# Patient Record
Sex: Male | Born: 1955 | Race: White | Hispanic: No | Marital: Married | Smoking: Former smoker
Health system: Southern US, Community
[De-identification: ages and names within clinical notes are randomized; demographics above are authoritative.]

## PROBLEM LIST (undated history)

## (undated) DIAGNOSIS — E785 Hyperlipidemia, unspecified: Secondary | ICD-10-CM

## (undated) DIAGNOSIS — I712 Thoracic aortic aneurysm, without rupture, unspecified: Secondary | ICD-10-CM

## (undated) DIAGNOSIS — I10 Essential (primary) hypertension: Secondary | ICD-10-CM

## (undated) DIAGNOSIS — G4733 Obstructive sleep apnea (adult) (pediatric): Secondary | ICD-10-CM

## (undated) DIAGNOSIS — C801 Malignant (primary) neoplasm, unspecified: Secondary | ICD-10-CM

## (undated) HISTORY — PX: EYE SURGERY: SHX253

## (undated) HISTORY — PX: OTHER SURGICAL HISTORY: SHX169

---

## 2017-06-21 ENCOUNTER — Other Ambulatory Visit: Payer: Self-pay

## 2017-06-21 ENCOUNTER — Encounter (HOSPITAL_COMMUNITY)
Admission: EM | Disposition: A | Discharge status: Home / Self Care | Payer: Self-pay | Source: Home / Self Care | Attending: Emergency Medicine

## 2017-06-21 ENCOUNTER — Telehealth: Payer: Self-pay | Admitting: Gastroenterology

## 2017-06-21 ENCOUNTER — Emergency Department (HOSPITAL_COMMUNITY)
Admission: EM | Admit: 2017-06-21 | Discharge: 2017-06-21 | Disposition: A | Discharge status: Home / Self Care | Payer: BLUE CROSS/BLUE SHIELD | Attending: Emergency Medicine | Admitting: Emergency Medicine

## 2017-06-21 ENCOUNTER — Encounter (HOSPITAL_COMMUNITY): Payer: Self-pay | Admitting: Emergency Medicine

## 2017-06-21 DIAGNOSIS — K2950 Unspecified chronic gastritis without bleeding: Secondary | ICD-10-CM | POA: Diagnosis not present

## 2017-06-21 DIAGNOSIS — K222 Esophageal obstruction: Secondary | ICD-10-CM

## 2017-06-21 DIAGNOSIS — X58XXXA Exposure to other specified factors, initial encounter: Secondary | ICD-10-CM | POA: Diagnosis not present

## 2017-06-21 DIAGNOSIS — C160 Malignant neoplasm of cardia: Secondary | ICD-10-CM | POA: Insufficient documentation

## 2017-06-21 DIAGNOSIS — R1319 Other dysphagia: Secondary | ICD-10-CM

## 2017-06-21 DIAGNOSIS — K209 Esophagitis, unspecified: Secondary | ICD-10-CM | POA: Diagnosis not present

## 2017-06-21 DIAGNOSIS — T18128A Food in esophagus causing other injury, initial encounter: Secondary | ICD-10-CM

## 2017-06-21 DIAGNOSIS — K2980 Duodenitis without bleeding: Secondary | ICD-10-CM | POA: Diagnosis not present

## 2017-06-21 DIAGNOSIS — K2970 Gastritis, unspecified, without bleeding: Secondary | ICD-10-CM

## 2017-06-21 DIAGNOSIS — F17210 Nicotine dependence, cigarettes, uncomplicated: Secondary | ICD-10-CM | POA: Insufficient documentation

## 2017-06-21 DIAGNOSIS — K2990 Gastroduodenitis, unspecified, without bleeding: Secondary | ICD-10-CM | POA: Diagnosis not present

## 2017-06-21 DIAGNOSIS — R1310 Dysphagia, unspecified: Secondary | ICD-10-CM

## 2017-06-21 DIAGNOSIS — I10 Essential (primary) hypertension: Secondary | ICD-10-CM | POA: Insufficient documentation

## 2017-06-21 DIAGNOSIS — B9681 Helicobacter pylori [H. pylori] as the cause of diseases classified elsewhere: Secondary | ICD-10-CM | POA: Diagnosis not present

## 2017-06-21 DIAGNOSIS — W44F3XA Food entering into or through a natural orifice, initial encounter: Secondary | ICD-10-CM

## 2017-06-21 HISTORY — PX: ESOPHAGOGASTRODUODENOSCOPY: SHX5428

## 2017-06-21 HISTORY — DX: Essential (primary) hypertension: I10

## 2017-06-21 HISTORY — DX: Hyperlipidemia, unspecified: E78.5

## 2017-06-21 HISTORY — PX: ESOPHAGEAL DILATION: SHX303

## 2017-06-21 HISTORY — DX: Obstructive sleep apnea (adult) (pediatric): G47.33

## 2017-06-21 LAB — BASIC METABOLIC PANEL
Anion gap: 13 (ref 5–15)
BUN: 13 mg/dL (ref 6–20)
CALCIUM: 9.4 mg/dL (ref 8.9–10.3)
CO2: 24 mmol/L (ref 22–32)
Chloride: 104 mmol/L (ref 101–111)
Creatinine, Ser: 0.91 mg/dL (ref 0.61–1.24)
GFR calc Af Amer: >60 mL/min (ref >60)
GLUCOSE: 96 mg/dL (ref 65–99)
Potassium: 3.9 mmol/L (ref 3.5–5.1)
Sodium: 141 mmol/L (ref 135–145)

## 2017-06-21 LAB — CBC WITH DIFFERENTIAL/PLATELET
Basophils Absolute: 0 10*3/uL (ref 0.0–0.1)
Basophils Relative: 0 %
Eosinophils Absolute: 0.3 10*3/uL (ref 0.0–0.7)
Eosinophils Relative: 3 %
HCT: 47.4 % (ref 39.0–52.0)
Hemoglobin: 16.2 g/dL (ref 13.0–17.0)
LYMPHS ABS: 3.4 10*3/uL (ref 0.7–4.0)
LYMPHS PCT: 33 %
MCH: 30.8 pg (ref 26.0–34.0)
MCHC: 34.2 g/dL (ref 30.0–36.0)
MCV: 90.1 fL (ref 78.0–100.0)
MONO ABS: 0.7 10*3/uL (ref 0.1–1.0)
Monocytes Relative: 7 %
Neutro Abs: 6 10*3/uL (ref 1.7–7.7)
Neutrophils Relative %: 57 %
PLATELETS: 187 10*3/uL (ref 150–400)
RBC: 5.26 MIL/uL (ref 4.22–5.81)
RDW: 13.1 % (ref 11.5–15.5)
WBC: 10.4 10*3/uL (ref 4.0–10.5)

## 2017-06-21 SURGERY — EGD (ESOPHAGOGASTRODUODENOSCOPY)
Anesthesia: Moderate Sedation

## 2017-06-21 MED ORDER — MINERAL OIL PO OIL
TOPICAL_OIL | ORAL | Status: AC
  Filled 2017-06-21: qty 30

## 2017-06-21 MED ORDER — OMEPRAZOLE 20 MG PO CPDR: DELAYED_RELEASE_CAPSULE | ORAL | 3 refills | Status: DC

## 2017-06-21 MED ORDER — LIDOCAINE VISCOUS 2 % MT SOLN
OROMUCOSAL | Status: AC
  Filled 2017-06-21: qty 15

## 2017-06-21 MED ORDER — SODIUM CHLORIDE 0.9% FLUSH
INTRAVENOUS | Status: AC
  Filled 2017-06-21: qty 10

## 2017-06-21 MED ORDER — SIMETHICONE 40 MG/0.6ML PO SUSP
ORAL | Status: AC
  Filled 2017-06-21: qty 30

## 2017-06-21 MED ORDER — MIDAZOLAM HCL 5 MG/5ML IJ SOLN
INTRAMUSCULAR | Status: AC
  Filled 2017-06-21: qty 10

## 2017-06-21 MED ORDER — MEPERIDINE HCL 100 MG/ML IJ SOLN
INTRAMUSCULAR | Status: AC
  Filled 2017-06-21: qty 2

## 2017-06-21 MED ORDER — STERILE WATER FOR IRRIGATION IR SOLN
Status: DC | PRN
  Administered 2017-06-21: 100 mL

## 2017-06-21 MED ORDER — MEPERIDINE HCL 100 MG/ML IJ SOLN
INTRAMUSCULAR | Status: DC | PRN
  Administered 2017-06-21: 50 mg via INTRAVENOUS

## 2017-06-21 MED ORDER — GLUCAGON HCL RDNA (DIAGNOSTIC) 1 MG IJ SOLR
1.0000 mg | Freq: Once | INTRAMUSCULAR | Status: AC
  Administered 2017-06-21: 1 mg via INTRAVENOUS
  Filled 2017-06-21: qty 1

## 2017-06-21 MED ORDER — PROMETHAZINE HCL 25 MG/ML IJ SOLN
INTRAMUSCULAR | Status: DC | PRN
  Administered 2017-06-21: 12.5 mg via INTRAVENOUS

## 2017-06-21 MED ORDER — MIDAZOLAM HCL 5 MG/5ML IJ SOLN
INTRAMUSCULAR | Status: DC | PRN
  Administered 2017-06-21: 1 mg via INTRAVENOUS
  Administered 2017-06-21: 2 mg via INTRAVENOUS

## 2017-06-21 MED ORDER — PROMETHAZINE HCL 25 MG/ML IJ SOLN
INTRAMUSCULAR | Status: AC
  Filled 2017-06-21: qty 1

## 2017-06-21 MED ORDER — SODIUM CHLORIDE 0.9 % IV SOLN: INTRAVENOUS | Status: DC

## 2017-06-21 NOTE — Consult Note (Signed)
Referring Provider: No ref. provider found Primary Care Physician:  [facility] Primary Gastroenterologist:  Barney Drain  Reason for Consultation: FOO DIMPACTION   Impression: FOOD IMPACTION  Plan: 1. EGD/POSSIBLE DILATION TODAY. DISCUSSED PROCEDURE, BENEFITS, & RISKS: < 1% chance of medication reaction, bleeding, ASPIRATION, OR perforation [address].      HPI:  62 YO MALE WITH FOOD STUCK IN HIS ESOPHAGUS SINCE YESTERDAY. PRESENTED TO ED @1836  BECAUSE HE CAN'T SWALLOW ANYTHING. RARE HEARTBURN. HAD SOLID FOOD DYSPHAGIA IN PAST. Chest hurts when you feel food moving.   PT DENIES FEVER, CHILLS, HEMATOCHEZIA, HEMATEMESIS, nausea, vomiting, melena, diarrhea, weight loss, SHORTNESS OF BREATH,  CHANGE IN BOWEL IN HABITS, constipation, abdominal pain, or problems with sedation,.   Past Medical History:  Diagnosis Date  . Hyperlipidemia   . Hypertension     Past Surgical History:  Procedure Laterality Date  . colonoscopy     [place] 81 polyps removed, age 70: ? POLYPS, AGE 53: ? POLYPS  . EYE SURGERY Bilateral    cataract    No Known Allergies  Current Facility-Administered Medications  Medication Dose Route Frequency Provider Last Rate Last Dose  . none        .         .         .         .         .                Family History  Problem Relation Age of Onset  . Colon cancer Neg Hx   . Colon polyps Neg Hx    Social History   Socioeconomic History  . Marital status: Married    Spouse name: Not on file  . Number of children: 2 children: one passed age 75 ~2-3 weeks ago.  . Years of education: Not on file  . Highest education level: Not on file  Occupational History  . Not on file  Social Needs  . Financial resource strain: Not on file  . Food insecurity:    Worry: Not on file    Inability: Not on file  . Transportation needs:    Medical: Not on file    Non-medical: Not on file  Tobacco Use   . Smoking status: Current Every Day Smoker    Packs/day: 1.00    Types: Cigarettes  . Smokeless tobacco: Never Used  Substance and Sexual Activity  . Alcohol use: Yes    Comment: occ:1-2X/WEEK-BEER  . Drug use: Never  . Sexual activity: Not on file  Lifestyle  . Physical activity:    Days per week: Not on file    Minutes per session: Not on file  . Stress: Not on file  Relationships  . Social connections:    Talks on phone: Not on file    Gets together: Not on file    Attends religious service: Not on file    Active member of club or organization: Not on file    Attends meetings of clubs or organizations: Not on file    Relationship status: Not on file  Other Topics Concern  . Not on file  Social History Narrative  . Clinical biochemist by trade   Review of Systems: PER HPI OTHERWISE ALL SYSTEMS ARE NEGATIVE.  Vitals: Pulse 90, temperature 98.2 F (36.8 C), temperature source Oral, resp. rate 16, height 5\' 10"  (1.778 m), weight 205 lb (93 kg), SpO2 95 %.  Physical Exam: General:   Alert,  Well-developed, well-nourished, pleasant and cooperative in NAD Head:  Normocephalic and atraumatic. Eyes:  Sclera clear, no icterus.   Conjunctiva pink. Mouth:  No lesions, dentition normal. Neck:  Supple; no masses. Lungs:  Clear throughout to auscultation.   No wheezes. No acute distress. Heart:  Regular rate and rhythm; no murmurs. Abdomen:  Soft, nontender and nondistended. No masses noted. Normal bowel sounds, without guarding, and without rebound.   Msk:  Symmetrical without gross deformities. Normal posture. Extremities:  Without edema. Neurologic:  Alert and  oriented x4;  grossly normal neurologically. Cervical Nodes:  No significant cervical adenopathy. Psych:  Alert and cooperative. Normal mood and affect.   Lab Results: Recent Labs    06/21/17 2100  WBC 10.4  HGB 16.2  HCT 47.4  PLT 187   BMET No results for input(s): NA, K, CL, CO2, GLUCOSE, BUN, CREATININE,  CALCIUM in the last 72 hours. LFT No results for input(s): PROT, ALBUMIN, AST, ALT, ALKPHOS, BILITOT, BILIDIR, IBILI in the last 72 hours.   Studies/Results: NONE  LOS: 0 days   [employer]  06/21/2017, 9:21 PM

## 2017-06-21 NOTE — Discharge Instructions (Signed)
I REMOVED THE STEAK. 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  You have gastritis. I biopsied your stomach.   DRINK WATER TO KEEP YOUR URINE LIGHT YELLOW.  FOLLOW A SOFT MECHANICAL DIET.  MEATS SHOULD BE GROUND ONLY. DO NOT EAT CHUNKS OF ANYTHING. SEE INFO BELOW.  START OMEPRAZOLE.  TAKE 30 MINUTES PRIOR TO YOUR MEALS ONCE DAILY.  USE PEPCID OR ZANTAC IF NEEDED FOR HEARTBURN. USED ALONE IT IS NOT ADEQUATE TO PREVENT ACID REFLUX.    YOUR BIOPSY WILL BE BACK IN 7 DAYS.  REPEAT EGD TO COMPLETE THE ESOPHAGEAL DILATION IN 2-3 WEEKS.  FOLLOW UP APPT IN 4 MONTHS.    UPPER ENDOSCOPY AFTER CARE Read the instructions outlined below and refer to this sheet in the next week. These discharge instructions provide you with general information on caring for yourself after you leave the hospital. While your treatment has been planned according to the most current medical practices available, unavoidable complications occasionally occur. If you have any problems or questions after discharge, call DR. Zayonna Ayuso, [phone].  ACTIVITY  You may resume your regular activity, but move at a slower pace for the next 24 hours.   Take frequent rest periods for the next 24 hours.   Walking will help get rid of the air and reduce the bloated feeling in your belly (abdomen).   No driving for 24 hours (because of the medicine (anesthesia) used during the test).   You may shower.   Do not sign any important legal documents or operate any machinery for 24 hours (because of the anesthesia used during the test).    NUTRITION  Drink plenty of fluids.   You may resume your normal diet as instructed by your doctor.   Begin with a light meal and progress to your normal diet. Heavy or fried foods are harder to digest and may make you feel sick to your stomach (nauseated).   Avoid alcoholic beverages for 24 hours or as instructed.    MEDICATIONS  You may resume your normal  medications.   WHAT YOU CAN EXPECT TODAY  Some feelings of bloating in the abdomen.   Passage of more gas than usual.    IF YOU HAD A BIOPSY TAKEN DURING THE UPPER ENDOSCOPY:  Eat a soft diet IF YOU HAVE NAUSEA, BLOATING, ABDOMINAL PAIN, OR VOMITING.    FINDING OUT THE RESULTS OF YOUR TEST Not all test results are available during your visit. DR. Oneida Alar WILL CALL YOU WITHIN 14 DAYS OF YOUR PROCEDUE WITH YOUR RESULTS. Do not assume everything is normal if you have not heard from DR. Charlita Brian IN 14 DAYS, CALL HER OFFICE AT [phone].  SEEK IMMEDIATE MEDICAL ATTENTION AND CALL THE OFFICE: [phone] IF:  You have more than a spotting of blood in your stool.   Your belly is swollen (abdominal distention).   You are nauseated or vomiting.   You have a temperature over 101F.   You have abdominal pain or discomfort that is severe or gets worse throughout the day.     Gastritis  Gastritis is an inflammation (the body's way of reacting to injury and/or infection) of the stomach. It is often caused by bacterial (germ) infections. It can also be caused BY ASPIRIN, BC/GOODY POWDER'S, (IBUPROFEN) MOTRIN, OR ALEVE (NAPROXEN), chemicals (including alcohol), SPICY FOODS, and medications. This illness may be associated with generalized malaise (feeling tired, not well), UPPER ABDOMINAL STOMACH cramps, and fever. One common bacterial cause of gastritis is an organism known  as H. Pylori. This can be treated with antibiotics.    SOFT MECHANICAL DIET This SOFT MECHANICAL DIET is restricted to:  Foods that are moist, soft-textured, and easy to chew and swallow.   Meats that are ground or are minced no larger than one-quarter inch pieces. Meats are moist with gravy or sauce added.   Foods that do not include bread or bread-like textures except soft pancakes, well-moistened with syrup or sauce.   Textures with some chewing ability required.   Casseroles without rice.   Cooked vegetables  that are less than half an inch in size and easily mashed with a fork. No cooked corn, peas, broccoli, cauliflower, cabbage, Brussels sprouts, asparagus, or other fibrous, non-tender or rubbery cooked vegetables.   Canned fruit except for pineapple. Fruit must be cut into pieces no larger than half an inch in size.   Foods that do not include nuts, seeds, coconut, or sticky textures.   FOOD TEXTURES FOR DYSPHAGIA DIET LEVEL 2 -SOFT MECHANICAL DIET (includes all foods on Dysphagia Diet Level 1 - Pureed, in addition to the foods listed below)  FOOD GROUP: Breads. RECOMMENDED: Soft pancakes, well-moistened with syrup or sauce.  AVOID: All others.  FOOD GROUP: Cereals.  RECOMMENDED: Cooked cereals with little texture, including oatmeal. Unprocessed wheat bran stirred into cereals for bulk. Note: If thin liquids are restricted, it is important that all of the liquid is absorbed into the cereal.  AVOID: All dry cereals and any cooked cereals that may contain flax seeds or other seeds or nuts. Whole-grain, dry, or coarse cereals. Cereals with nuts, seeds, dried fruit, and/or coconut.  FOOD GROUP: Desserts. RECOMMENDED: Pudding, custard. Soft fruit pies with bottom crust only. Canned fruit (excluding pineapple). Soft, moist cakes with icing.Frozen malts, milk shakes, frozen yogurt, eggnog, nutritional supplements, ice cream, sherbet, regular or sugar-free gelatin, or any foods that become thin liquid at either room (70 F) or body temperature (98 F).  AVOID: Dry, coarse cakes and cookies. Anything with nuts, seeds, coconut, pineapple, or dried fruit. Breakfast yogurt with nuts. Rice or bread pudding.  FOOD GROUP: Fats. RECOMMENDED: Butter, margarine, cream for cereal (depending on liquid consistency recommendations), gravy, cream sauces, sour cream, sour cream dips with soft additives, mayonnaise, salad dressings, cream cheese, cream cheese spreads with soft additives, whipped toppings.  AVOID:  All fats with coarse or chunky additives.  FOOD GROUP: Fruits. RECOMMENDED: Soft drained, canned, or cooked fruits without seeds or skin. Fresh soft and ripe banana. Fruit juices with a small amount of pulp. If thin liquids are restricted, fruit juices should be thickened to appropriate consistency.  AVOID: Fresh or frozen fruits. Cooked fruit with skin or seeds. Dried fruits. Fresh, canned, or cooked pineapple.  FOOD GROUP: Meats and Meat Substitutes. (Meat pieces should not exceed 1/4 of an inch cube and should be tender.) RECOMMENDED: Moistened ground or cooked meat, poultry, or fish. Moist ground or tender meat may be served with gravy or sauce. Casseroles without rice. Moist macaroni and cheese, well-cooked pasta with meat sauce, tuna noodle casserole, soft, moist lasagna. Moist meatballs, meatloaf, or fish loaf. Protein salads, such as tuna or egg without large chunks, celery, or onion. Cottage cheese, smooth quiche without large chunks. Poached, scrambled, or soft-cooked eggs (egg yolks should not be runny but should be moist and able to be mashed with butter, margarine, or other moisture added to them). (Cook eggs to 160 F or use pasteurized eggs for safety.) Souffls may have small, soft chunks. Tofu. Well-cooked,  slightly mashed, moist legumes, such as baked beans. All meats or protein substitutes should be served with sauces or moistened to help maintain cohesiveness in the oral cavity.  AVOID: Dry meats, tough meats (such as bacon, sausage, hot dogs, bratwurst). Dry casseroles or casseroles with rice or large chunks. Peanut butter. Cheese slices and cubes. Hard-cooked or crisp fried eggs. Sandwiches.Pizza.  FOOD GROUP: Potatoes and Starches. RECOMMENDED: Well-cooked, moistened, boiled, baked, or mashed potatoes. Well-cooked shredded hash brown potatoes that are not crisp. (All potatoes need to be moist and in sauces.)Well-cooked noodles in sauce. Spaetzel or soft dumplings that have  been moistened with butter or gravy.  AVOID: Potato skins and chips. Fried or French-fried potatoes. Rice.  FOOD GROUP: Soups. RECOMMENDED: Soups with easy-to-chew or easy-to-swallow meats or vegetables: Particle sizes in soups should be less than 1/2 inch. Soups will need to be thickened to appropriate consistency if soup is thinner than prescribed liquid consistency.  AVOID: Soups with large chunks of meat and vegetables. Soups with rice, corn, peas.  FOOD GROUP: Vegetables. RECOMMENDED: All soft, well-cooked vegetables. Vegetables should be less than a half inch. Should be easily mashed with a fork.  AVOID: Cooked corn and peas. Broccoli, cabbage, Brussels sprouts, asparagus, or other fibrous, non-tender or rubbery cooked vegetables.  FOOD GROUP: Miscellaneous. RECOMMENDED: Jams and preserves without seeds, jelly. Sauces, salsas, etc., that may have small tender chunks less than 1/2 inch. Soft, smooth chocolate bars that are easily chewed.  AVOID: Seeds, nuts, coconut, or sticky foods. Chewy candies such as caramels or licorice.

## 2017-06-21 NOTE — Telephone Encounter (Signed)
REPEAT EGD TO COMPLETE THE ESOPHAGEAL DILATION IN 2-3 WEEKS, Dx: DYSPHAGIA.  FOLLOW UP APPT IN 4 MONTHS E30 DYSPHAGIA/PERSONAL HISTORY OF POLYPS.

## 2017-06-21 NOTE — H&P (View-Only) (Signed)
Referring Provider: No ref. provider found Primary Care Physician:  [facility] Primary Gastroenterologist:  Barney Drain  Reason for Consultation: FOO DIMPACTION   Impression: FOOD IMPACTION  Plan: 1. EGD/POSSIBLE DILATION TODAY. DISCUSSED PROCEDURE, BENEFITS, & RISKS: < 1% chance of medication reaction, bleeding, ASPIRATION, OR perforation [place].      HPI:  62 YO MALE WITH FOOD STUCK IN HIS ESOPHAGUS SINCE YESTERDAY. PRESENTED TO ED @1836  BECAUSE HE CAN'T SWALLOW ANYTHING. RARE HEARTBURN. HAD SOLID FOOD DYSPHAGIA IN PAST. Chest hurts when you feel food moving.   PT DENIES FEVER, CHILLS, HEMATOCHEZIA, HEMATEMESIS, nausea, vomiting, melena, diarrhea, weight loss, SHORTNESS OF BREATH,  CHANGE IN BOWEL IN HABITS, constipation, abdominal pain, or problems with sedation,.   Past Medical History:  Diagnosis Date  . Hyperlipidemia   . Hypertension     Past Surgical History:  Procedure Laterality Date  . colonoscopy     [place] 89 polyps removed, age 56: ? POLYPS, AGE 48: ? POLYPS  . EYE SURGERY Bilateral    cataract    No Known Allergies  Current Facility-Administered Medications  Medication Dose Route Frequency Provider Last Rate Last Dose  . none        .         .         .         .         .                Family History  Problem Relation Age of Onset  . Colon cancer Neg Hx   . Colon polyps Neg Hx    Social History   Socioeconomic History  . Marital status: Married    Spouse name: Not on file  . Number of children: 2 children: one passed age 32 ~2-3 weeks ago.  . Years of education: Not on file  . Highest education level: Not on file  Occupational History  . Not on file  Social Needs  . Financial resource strain: Not on file  . Food insecurity:    Worry: Not on file    Inability: Not on file  . Transportation needs:    Medical: Not on file    Non-medical: Not on file  Tobacco Use   . Smoking status: Current Every Day Smoker    Packs/day: 1.00    Types: Cigarettes  . Smokeless tobacco: Never Used  Substance and Sexual Activity  . Alcohol use: Yes    Comment: occ:1-2X/WEEK-BEER  . Drug use: Never  . Sexual activity: Not on file  Lifestyle  . Physical activity:    Days per week: Not on file    Minutes per session: Not on file  . Stress: Not on file  Relationships  . Social connections:    Talks on phone: Not on file    Gets together: Not on file    Attends religious service: Not on file    Active member of club or organization: Not on file    Attends meetings of clubs or organizations: Not on file    Relationship status: Not on file  Other Topics Concern  . Not on file  Social History Narrative  . Clinical biochemist by trade   Review of Systems: PER HPI OTHERWISE ALL SYSTEMS ARE NEGATIVE.  Vitals: Pulse 90, temperature 98.2 F (36.8 C), temperature source Oral, resp. rate 16, height 5\' 10"  (1.778 m), weight 205 lb (93 kg), SpO2 95 %.  Physical Exam: General:   Alert,  Well-developed, well-nourished, pleasant and cooperative in NAD Head:  Normocephalic and atraumatic. Eyes:  Sclera clear, no icterus.   Conjunctiva pink. Mouth:  No lesions, dentition normal. Neck:  Supple; no masses. Lungs:  Clear throughout to auscultation.   No wheezes. No acute distress. Heart:  Regular rate and rhythm; no murmurs. Abdomen:  Soft, nontender and nondistended. No masses noted. Normal bowel sounds, without guarding, and without rebound.   Msk:  Symmetrical without gross deformities. Normal posture. Extremities:  Without edema. Neurologic:  Alert and  oriented x4;  grossly normal neurologically. Cervical Nodes:  No significant cervical adenopathy. Psych:  Alert and cooperative. Normal mood and affect.   Lab Results: Recent Labs    06/21/17 2100  WBC 10.4  HGB 16.2  HCT 47.4  PLT 187   BMET No results for input(s): NA, K, CL, CO2, GLUCOSE, BUN, CREATININE,  CALCIUM in the last 72 hours. LFT No results for input(s): PROT, ALBUMIN, AST, ALT, ALKPHOS, BILITOT, BILIDIR, IBILI in the last 72 hours.   Studies/Results: NONE  LOS: 0 days   [employer]  06/21/2017, 9:21 PM

## 2017-06-21 NOTE — ED Provider Notes (Signed)
[facility] EMERGENCY DEPARTMENT Provider Note   CSN: [id number] Arrival date & time: 06/21/17  1836     History   Chief Complaint Chief Complaint  Patient presents with  . Swallowed Foreign Body    HPI Phillip Lane is a 62 y.o. male with no significant past medical history including no history of acid reflux disease presenting with a 1 day history of lower esophageal pressure sensation (points to xyphoid)  since swallowing a piece of steak which has not passed completely after choking yesterday while eating.  He was able to cough up a small portion of the food bolus, but has been unable to get anything including liquids to pass into his stomach.  He has had several episodes of also vomiting saliva.  He denies sob, fevers, diaphoresis, abdominal pain.  The history is provided by the patient and the spouse.    Past Medical History:  Diagnosis Date  . Hyperlipidemia   . Hypertension     Patient Active Problem List   Diagnosis Date Noted  . Food impaction of esophagus 06/21/2017    Past Surgical History:  Procedure Laterality Date  . colonoscopy     [place] 56 poly[place], age 61: ? POLYPS, AGE 67: ? POLYPS  . EYE SURGERY Bilateral    cataract        Home Medications    Prior to Admission medications   Not on File    Family History Family History  Problem Relation Age of Onset  . Colon cancer Neg Hx   . Colon polyps Neg Hx     Social History Social History   Tobacco Use  . Smoking status: Current Every Day Smoker    Packs/day: 1.00    Types: Cigarettes  . Smokeless tobacco: Never Used  Substance Use Topics  . Alcohol use: Yes    Comment: occ:1-2X/WEEK-BEER  . Drug use: Never     Allergies   Patient has no known allergies.   Review of Systems Review of Systems  Constitutional: Negative for chills and fever.  HENT: Negative for congestion and sore throat.   Eyes: Negative.   Respiratory: Positive for choking. Negative for shortness  of breath, wheezing and stridor.   Cardiovascular: Negative for chest pain.  Gastrointestinal: Negative for abdominal pain, nausea and vomiting.       Negative except as mentioned in HPI.   Genitourinary: Negative.   Musculoskeletal: Negative for arthralgias, joint swelling and neck pain.  Skin: Negative.  Negative for rash and wound.  Neurological: Negative for dizziness, weakness, light-headedness, numbness and headaches.  Psychiatric/Behavioral: Negative.      Physical Exam Updated Vital Signs BP (!) 127/96   Pulse 62   Temp 97.7 F (36.5 C) (Oral)   Resp 16   Ht 5\' 10"  (1.778 m)   Wt 93 kg (205 lb)   SpO2 99%   BMI 29.41 kg/m   Physical Exam  Constitutional: He appears well-developed and well-nourished.  HENT:  Head: Normocephalic and atraumatic.  Eyes: Conjunctivae are normal.  Neck: Normal range of motion.  Cardiovascular: Normal rate, regular rhythm, normal heart sounds and intact distal pulses.  Pulmonary/Chest: Effort normal and breath sounds normal. No stridor. He has no wheezes. He exhibits no tenderness.  Abdominal: Soft. Bowel sounds are normal. He exhibits no distension. There is no tenderness. There is no guarding.  Musculoskeletal: Normal range of motion.  Neurological: He is alert.  Skin: Skin is warm and dry.  Psychiatric: He has a normal  mood and affect.  Nursing note and vitals reviewed.    ED Treatments / Results  Labs (all labs ordered are listed, but only abnormal results are displayed) Labs Reviewed  CBC WITH DIFFERENTIAL/PLATELET  BASIC METABOLIC PANEL    EKG None  Radiology No results found.  Procedures Procedures (including critical care time)  Medications Ordered in ED    Initial Impression / Assessment and Plan / ED Course  I have reviewed the triage vital signs and the nursing notes.  Pertinent labs & imaging results that were available during my care of the patient were reviewed by me and considered in my medical  decision making (see chart for details).     Pt given IV glucagon with no significant improvement in sx.  Discussed with Dr. Oneida Alar who will take pt to endoscopy this evening.  Discussed with pt who is aware and agrees with plan.  Final Clinical Impressions(s) / ED Diagnoses   Final diagnoses:  Esophageal obstruction due to food impaction    ED Discharge Orders    None       Landis Martins 06/21/17 2232    Noemi Chapel, MD 06/27/17 [phone]

## 2017-06-21 NOTE — Interval H&P Note (Signed)
History and Physical Interval Note:  06/21/2017 10:29 PM  Phillip Lane  has presented today for surgery, with the diagnosis of FOOD impaction  The various methods of treatment have been discussed with the patient and family. After consideration of risks, benefits and other options for treatment, the patient has consented to  Procedure(s): ESOPHAGOGASTRODUODENOSCOPY (EGD) (N/A) ESOPHAGEAL DILATION (N/A) as a surgical intervention .  The patient's history has been reviewed, patient examined, no change in status, stable for surgery.  I have reviewed the patient's chart and labs.  Questions were answered to the patient's satisfaction.     [employer]

## 2017-06-21 NOTE — ED Triage Notes (Signed)
Yesterday pt got chocked on steak, states he cough some up, has not been able to eat or drink since then, and feeling like it is stuck in lower esophagus.

## 2017-06-21 NOTE — ED Provider Notes (Signed)
Medical screening examination/treatment/procedure(s) were conducted as a shared visit with non-physician practitioner(s) and myself.  I personally evaluated the patient during the encounter.  Clinical Impression:   Final diagnoses:  Esophageal obstruction due to food impaction    The patient is a 62 year old male, he states that about 1 week ago he started having some difficulty swallowing intermittently but did not think anything of it as the food would eventually go down.  Yesterday he was eating steak and he noticed that it got stuck in his lower chest, after that he has been trying to eat and drink but nothing goes down and he immediately vomits.  He feels as though this is been present for approximately 24 hours.  On my exam the patient appears comfortable, he does not have any discomfort with palpation of his abdomen.  The patient likely has a food impaction and will need GI consultation.   Noemi Chapel, MD 06/27/17 [phone]

## 2017-06-21 NOTE — Op Note (Signed)
[facility] Patient Name: Phillip Lane Procedure Date: 06/21/2017 10:19 PM MRN: [id number] Date of Birth: 11-29-1955 Attending MD: Barney Drain MD, MD CSN: [id number] Age: 63 Admit Type: Outpatient Procedure:                Upper GI endoscopy WITH COLD FORCEPS                            BIOPSY/ESOPHGEAL DILATION Indications:              Dysphagia, Foreign body in the esophagus Providers:                Barney Drain MD, MD, Rosina Lowenstein, RN, Bonnetta Barry,                            Technician Referring MD:             Rory Percy, MD Medicines:                Promethazine 12.5 mg IV, Meperidine 50 mg IV,                            Midazolam 3 mg IV Complications:            No immediate complications. Estimated Blood Loss:     Estimated blood loss was minimal. Procedure:                Pre-Anesthesia Assessment:                           - Prior to the procedure, a History and Physical                            was performed, and patient medications and                            allergies were reviewed. The patient's tolerance of                            previous anesthesia was also reviewed. The risks                            and benefits of the procedure and the sedation                            options and risks were discussed with the patient.                            All questions were answered, and informed consent                            was obtained. Prior Anticoagulants: The patient has                            taken no previous anticoagulant or antiplatelet  agents. ASA Grade Assessment: II - A patient with                            mild systemic disease. After reviewing the risks                            and benefits, the patient was deemed in                            satisfactory condition to undergo the procedure.                            After obtaining informed consent, the endoscope was   passed under direct vision. Throughout the                            procedure, the patient's blood pressure, pulse, and                            oxygen saturations were monitored continuously. The                            EG-[id number] ([id number]) scope was introduced through the                            mouth, and advanced to the second part of duodenum.                            The upper GI endoscopy was accomplished without                            difficulty. The patient tolerated the procedure                            well. Scope In: 10:37:56 PM Scope Out: 10:57:46 PM Total Procedure Duration: 0 hours 19 minutes 50 seconds  Findings:      Food was found in the lower third of the esophagus. Removal of food was       accomplished VIA ROTH NET. MUCOSA IN DISTAL ESOPHAGUS EDEMATOUS AND WITH       SUPERFICIAL ULCERS.      One benign-appearing, intrinsic moderate (circumferential scarring or       stenosis; an endoscope may pass) stenosis was found. This stenosis       measured 1.2 cm (inner diameter). The stenosis was traversed. A       guidewire was placed and the scope was withdrawn. Dilation was performed       with a Savary dilator with mild resistance at 12 mm, 12.8 mm, 14 mm and       15 mm. Estimated blood loss was minimal.      Localized prominent gastric folds were found in the cardia. This was       biopsied with a cold forceps for histology(BTL 2).      Localized mild inflammation characterized by congestion (edema) and       erythema was found in the gastric antrum. Biopsies were  taken with a       cold forceps for Helicobacter pylori testing(2: BODY, 3: ANTRUM).      Patchy moderate inflammation characterized by congestion (edema),       erosions and erythema was found in the duodenal bulb and in the second       portion of the duodenum. Impression:               - IMPACTED Food in the lower third of the esophagus                            RESULTING IN ESOPHAGITIS.  Removal was successful.                           - Benign-appearing esophageal STRICTURE. Dilated.                           - Enlarged gastric folds AT THE CARDIA. Biopsied.                           - MILD Gastritis AND EROSIVE duodenitis. Moderate Sedation:      Moderate (conscious) sedation was administered by the endoscopy nurse       and supervised by the endoscopist. The following parameters were       monitored: oxygen saturation, heart rate, blood pressure, and response       to care. Total physician intraservice time was 27 minutes. Recommendation:           - Await pathology results.                           - Repeat upper endoscopy at appointment to be                            scheduled for retreatment IN 2-3 WEEKS.                           - Return to GI office in 4 months. NEEDS SCREENING                            COLONOSCOPY DUE TO PERSONAL HISTORY OF POLYPS.                           - Soft diet.                           - Continue present medications.                           - Use Prilosec (omeprazole) 20 mg PO daily.                           - Patient has a contact number available for                            emergencies. The signs and symptoms of potential  delayed complications were discussed with the                            patient. Return to normal activities tomorrow.                            Written discharge instructions were provided to the                            patient. Procedure Code(s):        --- Professional ---                           [id number], Esophagogastroduodenoscopy, flexible,                            transoral; with removal of foreign body(s)                           43248, Esophagogastroduodenoscopy, flexible,                            transoral; with insertion of guide wire followed by                            passage of dilator(s) through esophagus over guide                            wire                            43239, Esophagogastroduodenoscopy, flexible,                            transoral; with biopsy, single or multiple                           G0500, Moderate sedation services provided by the                            same physician or other qualified health care                            professional performing a gastrointestinal                            endoscopic service that sedation supports,                            requiring the presence of an independent trained                            observer to assist in the monitoring of the                            patient's level of consciousness and physiological  status; initial 15 minutes of intra-service time;                            patient age 57 years or older (additional time may                            be reported with [phone], as appropriate)                           [phone], Moderate sedation services provided by the                            same physician or other qualified health care                            professional performing the diagnostic or                            therapeutic service that the sedation supports,                            requiring the presence of an independent trained                            observer to assist in the monitoring of the                            patient's level of consciousness and physiological                            status; each additional 15 minutes intraservice                            time (List separately in addition to code for                            primary service) Diagnosis Code(s):        --- Professional ---                           X90.240X, Food in esophagus causing other injury,                            initial encounter                           K22.2, Esophageal obstruction                           K29.60, Other gastritis without bleeding                           K29.70, Gastritis,  unspecified, without bleeding                           K29.80,  Duodenitis without bleeding                           R13.10, Dysphagia, unspecified                           T18.108A, Unspecified foreign body in esophagus                            causing other injury, initial encounter CPT copyright 2017 [employer]. All rights reserved. The codes documented in this report are preliminary and upon coder review may  be revised to meet current compliance requirements. Barney Drain, MD Barney Drain MD, MD 06/21/2017 11:17:17 PM This report has been signed electronically. Number of Addenda: 0

## 2017-06-22 ENCOUNTER — Encounter: Payer: Self-pay | Admitting: Gastroenterology

## 2017-06-22 ENCOUNTER — Other Ambulatory Visit: Payer: Self-pay

## 2017-06-22 DIAGNOSIS — R1310 Dysphagia, unspecified: Secondary | ICD-10-CM

## 2017-06-22 NOTE — Telephone Encounter (Signed)
PATIENT SCHEDULED AND LETTER SENT  °

## 2017-06-22 NOTE — Telephone Encounter (Signed)
Called and spoke to pt's wife. EGD/Dil scheduled for 07/15/17 at 2:30pm. Instructions mailed. Orders entered.

## 2017-06-22 NOTE — Telephone Encounter (Signed)
Called [employer]. No PA needed for EGD/DIL. Ref# [id number].

## 2017-06-27 ENCOUNTER — Telehealth: Payer: Self-pay | Admitting: Gastroenterology

## 2017-06-27 DIAGNOSIS — C169 Malignant neoplasm of stomach, unspecified: Secondary | ICD-10-CM

## 2017-06-27 MED ORDER — AMOXICILLIN 500 MG PO TABS: ORAL_TABLET | ORAL | 0 refills | Status: DC

## 2017-06-27 MED ORDER — CLARITHROMYCIN 500 MG PO TABS: ORAL_TABLET | ORAL | 0 refills | Status: DC

## 2017-06-27 NOTE — Telephone Encounter (Signed)
Called patient TO DISCUSS RESULTS. SPOKE WITH PT'S WIFE. EXPLAINED RESULTS. PT HAS H PYLORI AND GASTRIC ADENOCa.   His stomach Bx showed H. Pylori infection. She needs AMOXICILLIN 500 mg 2 po BID for 10 days and Biaxin 500 mg po bid for 10 days. She needs Omeprazole 20 mg BID for 10 days then 1 po 30 mins prior to breakfast. Med side effects include NAUSEA, VOMITING, DIARRHEA, ABDOMINAL pain, and metallic taste.  Plan: 1. Complete ABX 2. KEEP APPT FOR EGD/DIL MAY 24. CALL TO CANCEL IF PT NOT HAVING TROUBLE SWALLOWING. 3. SEE ONCOLOGY ASAP FOR GASTRIC ADENO CA. 4. CALL WITH QUESTIONS OR CONCERNS. 5. FOLLOW UP IN 4 MOS E30 H PYLORI GASTRITIS/GASTRIC ADENOCA/DYSPHAGIA.

## 2017-06-28 ENCOUNTER — Encounter (HOSPITAL_COMMUNITY): Payer: Self-pay | Admitting: Emergency Medicine

## 2017-06-28 NOTE — Addendum Note (Signed)
Addended by: Inge Rise on: 06/28/2017 07:34 AM   Modules accepted: Orders

## 2017-06-28 NOTE — Telephone Encounter (Signed)
PATIENT SCHEDULED  °

## 2017-06-28 NOTE — Telephone Encounter (Signed)
Noted  

## 2017-06-28 NOTE — Telephone Encounter (Signed)
Referral placed.

## 2017-06-30 ENCOUNTER — Encounter (HOSPITAL_COMMUNITY): Payer: Self-pay | Admitting: Gastroenterology

## 2017-07-04 ENCOUNTER — Inpatient Hospital Stay (HOSPITAL_COMMUNITY): Payer: BLUE CROSS/BLUE SHIELD | Attending: Hematology | Admitting: Hematology

## 2017-07-04 ENCOUNTER — Encounter (HOSPITAL_COMMUNITY): Payer: Self-pay | Admitting: Hematology

## 2017-07-04 DIAGNOSIS — G4733 Obstructive sleep apnea (adult) (pediatric): Secondary | ICD-10-CM | POA: Diagnosis not present

## 2017-07-04 DIAGNOSIS — E7800 Pure hypercholesterolemia, unspecified: Secondary | ICD-10-CM

## 2017-07-04 DIAGNOSIS — Z79899 Other long term (current) drug therapy: Secondary | ICD-10-CM | POA: Diagnosis not present

## 2017-07-04 DIAGNOSIS — K2950 Unspecified chronic gastritis without bleeding: Secondary | ICD-10-CM

## 2017-07-04 DIAGNOSIS — C160 Malignant neoplasm of cardia: Secondary | ICD-10-CM | POA: Diagnosis not present

## 2017-07-04 DIAGNOSIS — I10 Essential (primary) hypertension: Secondary | ICD-10-CM | POA: Diagnosis not present

## 2017-07-04 DIAGNOSIS — F17210 Nicotine dependence, cigarettes, uncomplicated: Secondary | ICD-10-CM | POA: Diagnosis not present

## 2017-07-04 NOTE — Patient Instructions (Signed)
[facility]  Discharge Instructions:  You were seen by Dr. Katragadda today.   _______________________________________________________________  Thank you for choosing [facility] at [facility] to provide your oncology and hematology care.  To afford each patient quality time with our providers, please arrive at least 15 minutes before your scheduled appointment.  You need to re-schedule your appointment if you arrive 10 or more minutes late.  We strive to give you quality time with our providers, and arriving late affects you and other patients whose appointments are after yours.  Also, if you no show three or more times for appointments you may be dismissed from the clinic.  Again, thank you for choosing [facility] at [facility]. Our hope is that these requests will allow you access to exceptional care and in a timely manner. _______________________________________________________________  If you have questions after your visit, please contact our office at [phone] between the hours of 8:30 a.m. and 5:00 p.m. Voicemails left after 4:30 p.m. will not be returned until the following business day. _______________________________________________________________  For prescription refill requests, have your pharmacy contact our office. _______________________________________________________________  Recommendations made by the consultant and any test results will be sent to your referring physician. _______________________________________________________________ 

## 2017-07-04 NOTE — Assessment & Plan Note (Signed)
1.  GE junction poorly differentiated adenocarcinoma: - EGD on 06/21/2017 shows benign-appearing stenosis which was dilated in the distal esophagus.  Localized prominent gastric folds were found in the cardia, which was biopsied.  There was also localized mild inflammation with congestion and erythema in the antrum, biopsies were taken for H. pylori testing. - Stomach biopsy shows chronic gastritis with H. pylori positive.  Esophageal gastric junction biopsy shows poorly differentiated adenocarcinoma. -I have discussed the findings of the pathology report with the patient and his wife in detail. -I have recommended doing a PET/CT scan to evaluate for any metastatic disease.  If there is no evidence of metastatic disease, he will require endoscopic ultrasound for accurate staging.  If metastatic disease is documented, we will obtain testing for MSI/MMR, HER-2 and PDL 1.  I will contact Dr. Oneida Alar to know the exact location of the biopsy as there was no clear mass visualized.  I will see him back after the PET CT scan to discuss the results.

## 2017-07-04 NOTE — Progress Notes (Signed)
[facility] CONSULT NOTE  Patient Care Team: Practice, [facility] as PCP - General  CHIEF COMPLAINTS/PURPOSE OF CONSULTATION:  GE junction poorly differentiated adenocarcinoma  HISTORY OF PRESENTING ILLNESS:  Phillip Lane 62 y.o. male is seen in consultation today for further work-up and management of newly diagnosed GE junction poorly differentiated adenocarcinoma.  After eating a steak, he felt that food was stuck in his retrosternal area.  He came to the emergency room on 06/21/2017 and was evaluated by Dr. Oneida Alar.  He underwent EGD which showed impacted food in the lower third of the esophagus.  There is benign esophageal stricture which was dilated.  Enlarged gastric folds at cardia were biopsied.  Localized mild inflammation with erythema and congestion was found in the gastric antrum which was biopsied and sent for H. pylori testing.  Patient denies any weight loss in the last 6 months.  He never had any symptoms of acid reflux.  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  He smoked about 1 and half pack per day of cigarettes for 40 years.  He quit recently.  He was placed on treatment for H. pylori with amoxicillin, clarithromycin and omeprazole.  No family history of GI malignancies.  Son passed away recently from Burkitt's lymphoma.  Otherwise he is fully functional and does not have any major medical problems other than hypertension, sleep apnea and high cholesterol.  MEDICAL HISTORY:  Past Medical History:  Diagnosis Date  . Hyperlipidemia   . Hypertension   . Sleep apnea, obstructive     SURGICAL HISTORY: Past Surgical History:  Procedure Laterality Date  . colonoscopy     [place] 66 polyps removed, age 35: ? POLYPS, AGE 80: ? POLYPS  . ESOPHAGEAL DILATION N/A 06/21/2017   Procedure: ESOPHAGEAL DILATION;  Surgeon: Phillip Binder, MD;  Location: [facility];  Service: Endoscopy;  Laterality: N/A;  . ESOPHAGOGASTRODUODENOSCOPY  N/A 06/21/2017   Procedure: ESOPHAGOGASTRODUODENOSCOPY (EGD);  Surgeon: Phillip Binder, MD;  Location: [facility];  Service: Endoscopy;  Laterality: N/A;  . EYE SURGERY Bilateral    cataract    SOCIAL HISTORY: Social History   Socioeconomic History  . Marital status: Married    Spouse name: Not on file  . Number of children: Not on file  . Years of education: Not on file  . Highest education level: Not on file  Occupational History  . Not on file  Social Needs  . Financial resource strain: Not on file  . Food insecurity:    Worry: Not on file    Inability: Not on file  . Transportation needs:    Medical: Not on file    Non-medical: Not on file  Tobacco Use  . Smoking status: Current Every Day Smoker    Packs/day: 1.00    Types: Cigarettes  . Smokeless tobacco: Never Used  Substance and Sexual Activity  . Alcohol use: Yes    Comment: occ:1-2X/WEEK-BEER  . Drug use: Never  . Sexual activity: Not on file  Lifestyle  . Physical activity:    Days per week: Not on file    Minutes per session: Not on file  . Stress: Not on file  Relationships  . Social connections:    Talks on phone: Not on file    Gets together: Not on file    Attends religious service: Not on file    Active member of club or organization: Not on file    Attends meetings  of clubs or organizations: Not on file    Relationship status: Not on file  . Intimate partner violence:    Fear of current or ex partner: Not on file    Emotionally abused: Not on file    Physically abused: Not on file    Forced sexual activity: Not on file  Other Topics Concern  . Not on file  Social History Narrative  . Not on file    FAMILY HISTORY: Family History  Problem Relation Age of Onset  . Colon cancer Neg Hx   . Colon polyps Neg Hx     ALLERGIES:  has No Known Allergies.  MEDICATIONS:  Current Outpatient Medications  Medication Sig Dispense Refill  . atorvastatin (LIPITOR) 40 MG tablet Take 40 mg by  mouth daily.    . Verapamil HCl CR 200 MG CP24 Take 200 mg by mouth daily.    Phillip Lane amoxicillin (AMOXIL) 500 MG tablet 2 PO BID FOR 10 DAYS 40 tablet 0  . clarithromycin (BIAXIN) 500 MG tablet 1 PO BID FOR 10 DAYS. 20 tablet 0  . losartan (COZAAR) 100 MG tablet Take 1 tablet by mouth daily with breakfast.    . omeprazole (PRILOSEC) 20 MG capsule 1 PO 30 MINS PRIOR TO BREAKFAST. 30 capsule 3   No current facility-administered medications for this visit.     REVIEW OF SYSTEMS:   Constitutional: Denies fevers, chills or abnormal night sweats Eyes: Denies blurriness of vision, double vision or watery eyes Ears, nose, mouth, throat, and face: Denies mucositis or sore throat Respiratory: Denies cough, dyspnea or wheezes Cardiovascular: Denies palpitation, chest discomfort or lower extremity swelling Gastrointestinal:  Denies nausea, heartburn or change in bowel habits Skin: Denies abnormal skin rashes Lymphatics: Denies new lymphadenopathy or easy bruising Neurological:Denies numbness, tingling or new weaknesses Behavioral/Psych: Mood is stable, no new changes  All other systems were reviewed with the patient and are negative.  PHYSICAL EXAMINATION: ECOG PERFORMANCE STATUS: 0 - Asymptomatic  Vitals:   07/04/17 0959  BP: 140/85  Pulse: 77  Resp: 20  Temp: 97.9 F (36.6 C)  SpO2: 98%   Filed Weights   07/04/17 0959  Weight: 201 lb 12.8 oz (91.5 kg)    GENERAL:alert, no distress and comfortable SKIN: skin color, texture, turgor are normal, no rashes or significant lesions EYES: normal, conjunctiva are pink and non-injected, sclera clear OROPHARYNX:no exudate, no erythema and lips, buccal mucosa, and tongue normal  NECK: supple, thyroid normal size, non-tender, without nodularity LYMPH:  no palpable lymphadenopathy in the cervical, axillary or inguinal LUNGS: clear to auscultation and percussion with normal breathing effort HEART: regular rate & rhythm and no murmurs and no lower  extremity edema ABDOMEN:abdomen soft, non-tender and normal bowel sounds Musculoskeletal:no cyanosis of digits and no clubbing  PSYCH: alert & oriented x 3 with fluent speech   LABORATORY DATA:  I have reviewed the data as listed Lab Results  Component Value Date   WBC 10.4 06/21/2017   HGB 16.2 06/21/2017   HCT 47.4 06/21/2017   MCV 90.1 06/21/2017   PLT 187 06/21/2017     Chemistry      Component Value Date/Time   NA 141 06/21/2017 2100   K 3.9 06/21/2017 2100   CL 104 06/21/2017 2100   CO2 24 06/21/2017 2100   BUN 13 06/21/2017 2100   CREATININE 0.91 06/21/2017 2100      Component Value Date/Time   CALCIUM 9.4 06/21/2017 2100       RADIOGRAPHIC STUDIES:  I have personally reviewed EGD report dated 06/21/2017.  ASSESSMENT & PLAN:  Adenocarcinoma of gastroesophageal junction (HCC) 1.  GE junction poorly differentiated adenocarcinoma: - EGD on 06/21/2017 shows benign-appearing stenosis which was dilated in the distal esophagus.  Localized prominent gastric folds were found in the cardia, which was biopsied.  There was also localized mild inflammation with congestion and erythema in the antrum, biopsies were taken for H. pylori testing. - Stomach biopsy shows chronic gastritis with H. pylori positive.  Esophageal gastric junction biopsy shows poorly differentiated adenocarcinoma. -I have discussed the findings of the pathology report with the patient and his wife in detail. -I have recommended doing a PET/CT scan to evaluate for any metastatic disease.  If there is no evidence of metastatic disease, he will require endoscopic ultrasound for accurate staging.  If metastatic disease is documented, we will obtain testing for MSI/MMR, HER-2 and PDL 1.  I will contact Dr. Oneida Alar to know the exact location of the biopsy as there was no clear mass visualized.  I will see him back after the PET CT scan to discuss the results.  Orders Placed This Encounter  Procedures  . NM PET Image  Initial (PI) Skull Base To Thigh    Standing Status:   Future    Standing Expiration Date:   07/04/2018    Order Specific Question:   If indicated for the ordered procedure, I authorize the administration of a radiopharmaceutical per Radiology protocol    Answer:   Yes    Order Specific Question:   Preferred imaging location?    Answer:   [facility]    Order Specific Question:   Radiology Contrast Protocol - do NOT remove file path    Answer:   [email or web address]    Order Specific Question:   Reason for Exam additional comments    Answer:   newly dxed GE junction adeno carcinoma, initial staging    All questions were answered. The patient knows to call the clinic with any problems, questions or concerns. Total time spent is 45 minutes with more than 50% of the time spent face-to-face discussing pathology reports, new diagnosis and further work-up.     Derek Jack, MD 07/04/2017 12:06 PM

## 2017-07-04 NOTE — H&P (View-Only) (Signed)
[facility] CONSULT NOTE  Patient Care Team: Practice, [facility] as PCP - General  CHIEF COMPLAINTS/PURPOSE OF CONSULTATION:  GE junction poorly differentiated adenocarcinoma  HISTORY OF PRESENTING ILLNESS:  Phillip Lane 62 y.o. male is seen in consultation today for further work-up and management of newly diagnosed GE junction poorly differentiated adenocarcinoma.  After eating a steak, he felt that food was stuck in his retrosternal area.  He came to the emergency room on 06/21/2017 and was evaluated by Dr. Oneida Alar.  He underwent EGD which showed impacted food in the lower third of the esophagus.  There is benign esophageal stricture which was dilated.  Enlarged gastric folds at cardia were biopsied.  Localized mild inflammation with erythema and congestion was found in the gastric antrum which was biopsied and sent for H. pylori testing.  Patient denies any weight loss in the last 6 months.  He never had any symptoms of acid reflux.  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  He smoked about 1 and half pack per day of cigarettes for 40 years.  He quit recently.  He was placed on treatment for H. pylori with amoxicillin, clarithromycin and omeprazole.  No family history of GI malignancies.  Son passed away recently from Burkitt's lymphoma.  Otherwise he is fully functional and does not have any major medical problems other than hypertension, sleep apnea and high cholesterol.  MEDICAL HISTORY:  Past Medical History:  Diagnosis Date  . Hyperlipidemia   . Hypertension   . Sleep apnea, obstructive     SURGICAL HISTORY: Past Surgical History:  Procedure Laterality Date  . colonoscopy     [place] 22 [place], age 9: ? POLYPS, AGE 86: ? POLYPS  . ESOPHAGEAL DILATION N/A 06/21/2017   Procedure: ESOPHAGEAL DILATION;  Surgeon: Danie Binder, MD;  [facility];  Service: Endoscopy;  Laterality: N/A;  . ESOPHAGOGASTRODUODENOSCOPY  N/A 06/21/2017   Procedure: ESOPHAGOGASTRODUODENOSCOPY (EGD);  Surgeon: Danie Binder, MD;  Location[facility];  Service: Endoscopy;  Laterality: N/A;  . EYE SURGERY Bilateral    cataract    SOCIAL HISTORY: Social History   Socioeconomic History  . Marital status: Married    Spouse name: Not on file  . Number of children: Not on file  . Years of education: Not on file  . Highest education level: Not on file  Occupational History  . Not on file  Social Needs  . Financial resource strain: Not on file  . Food insecurity:    Worry: Not on file    Inability: Not on file  . Transportation needs:    Medical: Not on file    Non-medical: Not on file  Tobacco Use  . Smoking status: Current Every Day Smoker    Packs/day: 1.00    Types: Cigarettes  . Smokeless tobacco: Never Used  Substance and Sexual Activity  . Alcohol use: Yes    Comment: occ:1-2X/WEEK-BEER  . Drug use: Never  . Sexual activity: Not on file  Lifestyle  . Physical activity:    Days per week: Not on file    Minutes per session: Not on file  . Stress: Not on file  Relationships  . Social connections:    Talks on phone: Not on file    Gets together: Not on file    Attends religious service: Not on file    Active member of club or organization: Not on file    Attends meetings  of clubs or organizations: Not on file    Relationship status: Not on file  . Intimate partner violence:    Fear of current or ex partner: Not on file    Emotionally abused: Not on file    Physically abused: Not on file    Forced sexual activity: Not on file  Other Topics Concern  . Not on file  Social History Narrative  . Not on file    FAMILY HISTORY: Family History  Problem Relation Age of Onset  . Colon cancer Neg Hx   . Colon polyps Neg Hx     ALLERGIES:  has No Known Allergies.  MEDICATIONS:  Current Outpatient Medications  Medication Sig Dispense Refill  . atorvastatin (LIPITOR) 40 MG tablet Take 40 mg by  mouth daily.    . Verapamil HCl CR 200 MG CP24 Take 200 mg by mouth daily.    Marland Kitchen amoxicillin (AMOXIL) 500 MG tablet 2 PO BID FOR 10 DAYS 40 tablet 0  . clarithromycin (BIAXIN) 500 MG tablet 1 PO BID FOR 10 DAYS. 20 tablet 0  . losartan (COZAAR) 100 MG tablet Take 1 tablet by mouth daily with breakfast.    . omeprazole (PRILOSEC) 20 MG capsule 1 PO 30 MINS PRIOR TO BREAKFAST. 30 capsule 3   No current facility-administered medications for this visit.     REVIEW OF SYSTEMS:   Constitutional: Denies fevers, chills or abnormal night sweats Eyes: Denies blurriness of vision, double vision or watery eyes Ears, nose, mouth, throat, and face: Denies mucositis or sore throat Respiratory: Denies cough, dyspnea or wheezes Cardiovascular: Denies palpitation, chest discomfort or lower extremity swelling Gastrointestinal:  Denies nausea, heartburn or change in bowel habits Skin: Denies abnormal skin rashes Lymphatics: Denies new lymphadenopathy or easy bruising Neurological:Denies numbness, tingling or new weaknesses Behavioral/Psych: Mood is stable, no new changes  All other systems were reviewed with the patient and are negative.  PHYSICAL EXAMINATION: ECOG PERFORMANCE STATUS: 0 - Asymptomatic  Vitals:   07/04/17 0959  BP: 140/85  Pulse: 77  Resp: 20  Temp: 97.9 F (36.6 C)  SpO2: 98%   Filed Weights   07/04/17 0959  Weight: 201 lb 12.8 oz (91.5 kg)    GENERAL:alert, no distress and comfortable SKIN: skin color, texture, turgor are normal, no rashes or significant lesions EYES: normal, conjunctiva are pink and non-injected, sclera clear OROPHARYNX:no exudate, no erythema and lips, buccal mucosa, and tongue normal  NECK: supple, thyroid normal size, non-tender, without nodularity LYMPH:  no palpable lymphadenopathy in the cervical, axillary or inguinal LUNGS: clear to auscultation and percussion with normal breathing effort HEART: regular rate & rhythm and no murmurs and no lower  extremity edema ABDOMEN:abdomen soft, non-tender and normal bowel sounds Musculoskeletal:no cyanosis of digits and no clubbing  PSYCH: alert & oriented x 3 with fluent speech   LABORATORY DATA:  I have reviewed the data as listed Lab Results  Component Value Date   WBC 10.4 06/21/2017   HGB 16.2 06/21/2017   HCT 47.4 06/21/2017   MCV 90.1 06/21/2017   PLT 187 06/21/2017     Chemistry      Component Value Date/Time   NA 141 06/21/2017 2100   K 3.9 06/21/2017 2100   CL 104 06/21/2017 2100   CO2 24 06/21/2017 2100   BUN 13 06/21/2017 2100   CREATININE 0.91 06/21/2017 2100      Component Value Date/Time   CALCIUM 9.4 06/21/2017 2100       RADIOGRAPHIC STUDIES:  I have personally reviewed EGD report dated 06/21/2017.  ASSESSMENT & PLAN:  Adenocarcinoma of gastroesophageal junction (HCC) 1.  GE junction poorly differentiated adenocarcinoma: - EGD on 06/21/2017 shows benign-appearing stenosis which was dilated in the distal esophagus.  Localized prominent gastric folds were found in the cardia, which was biopsied.  There was also localized mild inflammation with congestion and erythema in the antrum, biopsies were taken for H. pylori testing. - Stomach biopsy shows chronic gastritis with H. pylori positive.  Esophageal gastric junction biopsy shows poorly differentiated adenocarcinoma. -I have discussed the findings of the pathology report with the patient and his wife in detail. -I have recommended doing a PET/CT scan to evaluate for any metastatic disease.  If there is no evidence of metastatic disease, he will require endoscopic ultrasound for accurate staging.  If metastatic disease is documented, we will obtain testing for MSI/MMR, HER-2 and PDL 1.  I will contact Dr. Oneida Alar to know the exact location of the biopsy as there was no clear mass visualized.  I will see him back after the PET CT scan to discuss the results.  Orders Placed This Encounter  Procedures  . NM PET Image  Initial (PI) Skull Base To Thigh    Standing Status:   Future    Standing Expiration Date:   07/04/2018    Order Specific Question:   If indicated for the ordered procedure, I authorize the administration of a radiopharmaceutical per Radiology protocol    Answer:   Yes    Order Specific Question:   Preferred imaging location?    Answer:   [facility]    Order Specific Question:   Radiology Contrast Protocol - do NOT remove file path    Answer:   [email or web address]    Order Specific Question:   Reason for Exam additional comments    Answer:   newly dxed GE junction adeno carcinoma, initial staging    All questions were answered. The patient knows to call the clinic with any problems, questions or concerns. Total time spent is 45 minutes with more than 50% of the time spent face-to-face discussing pathology reports, new diagnosis and further work-up.     Derek Jack, MD 07/04/2017 12:06 PM

## 2017-07-05 ENCOUNTER — Telehealth: Payer: Self-pay | Admitting: General Practice

## 2017-07-05 NOTE — Telephone Encounter (Signed)
   [facility] Psychosocial Distress Screening Clinical Social Work  Clinical Social Work was referred by distress screening protocol.  The patient scored a 3 on the Psychosocial Distress Thermometer which indicates mild distress. Clinical Social Worker Edwyna Shell to assess for distress and other psychosocial needs. Patient at work, spoke w wife.  Patient recently lost son to cancer, approx 4 weeks ago.  Wife concerned about getting the appropriate treatment at best time - is aware of need for good treatment team.  Discussed ways to find information including reliable websites, referred to [place] as well as sites of major hospital based cancer centers.  Wife concerned that patient receive care where team treats multiple cases of stomach cancer.  Wife aware that patient remains in the testing phase of treatment planning, are awaiting clear cut answers and next steps.    ONCBCN DISTRESS SCREENING 07/04/2017  Screening Type Initial Screening  Distress experienced in past week (1-10) 3  Information Concerns Type Lack of info about diagnosis  Physician notified of physical symptoms Yes  Referral to clinical psychology No  Referral to clinical social work No  Referral to dietition No  Referral to financial advocate No  Referral to support programs No  Referral to palliative care No    Clinical Social Worker follow up needed: No.  If yes, follow up plan:   Edwyna Shell, LCSW Clinical Social Worker Phone:  [phone]

## 2017-07-12 ENCOUNTER — Encounter (HOSPITAL_COMMUNITY)
Admission: RE | Admit: 2017-07-12 | Discharge: 2017-07-12 | Disposition: A | Discharge status: Home / Self Care | Payer: BLUE CROSS/BLUE SHIELD | Source: Ambulatory Visit | Attending: Hematology | Admitting: Hematology

## 2017-07-12 DIAGNOSIS — C160 Malignant neoplasm of cardia: Secondary | ICD-10-CM | POA: Insufficient documentation

## 2017-07-12 LAB — GLUCOSE, CAPILLARY: GLUCOSE-CAPILLARY: 93 mg/dL (ref 65–99)

## 2017-07-12 MED ORDER — FLUDEOXYGLUCOSE F - 18 (FDG) INJECTION
9.9000 | Freq: Once | INTRAVENOUS | Status: AC | PRN
  Administered 2017-07-12: 9.9 via INTRAVENOUS

## 2017-07-13 ENCOUNTER — Other Ambulatory Visit: Payer: Self-pay

## 2017-07-13 ENCOUNTER — Telehealth: Payer: Self-pay | Admitting: General Practice

## 2017-07-13 ENCOUNTER — Encounter (HOSPITAL_COMMUNITY): Payer: Self-pay | Admitting: Hematology

## 2017-07-13 ENCOUNTER — Inpatient Hospital Stay (HOSPITAL_BASED_OUTPATIENT_CLINIC_OR_DEPARTMENT_OTHER): Payer: BLUE CROSS/BLUE SHIELD | Admitting: Hematology

## 2017-07-13 DIAGNOSIS — K2950 Unspecified chronic gastritis without bleeding: Secondary | ICD-10-CM | POA: Diagnosis not present

## 2017-07-13 DIAGNOSIS — G4733 Obstructive sleep apnea (adult) (pediatric): Secondary | ICD-10-CM

## 2017-07-13 DIAGNOSIS — E7800 Pure hypercholesterolemia, unspecified: Secondary | ICD-10-CM | POA: Diagnosis not present

## 2017-07-13 DIAGNOSIS — C160 Malignant neoplasm of cardia: Secondary | ICD-10-CM | POA: Diagnosis not present

## 2017-07-13 DIAGNOSIS — I10 Essential (primary) hypertension: Secondary | ICD-10-CM

## 2017-07-13 DIAGNOSIS — F17210 Nicotine dependence, cigarettes, uncomplicated: Secondary | ICD-10-CM | POA: Diagnosis not present

## 2017-07-13 DIAGNOSIS — Z79899 Other long term (current) drug therapy: Secondary | ICD-10-CM

## 2017-07-13 NOTE — Progress Notes (Signed)
[facility] FOLLOW-UP NOTE  Patient Care Team: Practice, [facility] as PCP - General  CHIEF COMPLAINTS:  GE junction poorly differentiated adenocarcinoma  HISTORY OF PRESENTING ILLNESS:  Phillip Lane 62 y.o. male is seen in consultation today for further work-up and management of newly diagnosed GE junction poorly differentiated adenocarcinoma.  After eating a steak, he felt that food was stuck in his retrosternal area.  He came to the emergency room on 06/21/2017 and was evaluated by Dr. Oneida Alar.  He underwent EGD which showed impacted food in the lower third of the esophagus.  There is benign esophageal stricture which was dilated.  Enlarged gastric folds at cardia were biopsied.  Localized mild inflammation with erythema and congestion was found in the gastric antrum which was biopsied and sent for H. pylori testing.  Patient denies any weight loss in the last 6 months.  He never had any symptoms of acid reflux.  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.  He smoked about 1 and half pack per day of cigarettes for 40 years.  He quit recently.  He was placed on treatment for H. pylori with amoxicillin, clarithromycin and omeprazole.  No family history of GI malignancies.  Son passed away recently from Burkitt's lymphoma.  Otherwise he is fully functional and does not have any major medical problems other than hypertension, sleep apnea and high cholesterol.   INTERVAL HISTORY:  Phillip Lane 62 y.o. male here for follow-up for GE junction cancer.   Here today with family.    He has completed recent course of antibiotics.  Denies heartburn; remains on PPI as scheduled.  Denies any weight loss.  Endorses occasional dysphagia; he has to chew his food very well. This was worse with meat that he ate yesterday. Appetite is good. Denies N&V.  Recently had PET scan; these results are reviewed with patient today. No evidence of distant metastatic disease. Discussed next  steps, including EUS procedure.  This procedure was discussed with patient/family today; he agrees to proceed. This will help with ultimate T and N staging.   Discussed treatment options, which will likely include chemo and radiation.    Family states that he is scheduled for esophageal dilation this Friday; recommend postponing this procedure until EUS is done.  We will refer him to local surgeons to discuss port-a-cath placement for likely chemo.   Their son recently passed away with Burkitt's lymphoma.   He works for [employer]; this is a Scientist, forensic. They are working on getting FMLA paperwork completed.     MEDICAL HISTORY:  Past Medical History:  Diagnosis Date  . Hyperlipidemia   . Hypertension   . Sleep apnea, obstructive     SURGICAL HISTORY: Past Surgical History:  Procedure Laterality Date  . colonoscopy     [place] 27 [place], age 76: ? POLYPS, AGE 35: ? POLYPS  . ESOPHAGEAL DILATION N/A 06/21/2017   Procedure: ESOPHAGEAL DILATION;  Surgeon: Danie Binder, MD;  [facility];  Service: Endoscopy;  Laterality: N/A;  . ESOPHAGOGASTRODUODENOSCOPY N/A 06/21/2017   Procedure: ESOPHAGOGASTRODUODENOSCOPY (EGD);  Surgeon: Danie Binder, MD;  [facility];  Service: Endoscopy;  Laterality: N/A;  . EYE SURGERY Bilateral    cataract    SOCIAL HISTORY: Social History   Socioeconomic History  . Marital status: Married    Spouse name: Not on file  . Number of children: Not on file  . Years of education: Not  on file  . Highest education level: Not on file  Occupational History  . Not on file  Social Needs  . Financial resource strain: Not on file  . Food insecurity:    Worry: Not on file    Inability: Not on file  . Transportation needs:    Medical: Not on file    Non-medical: Not on file  Tobacco Use  . Smoking status: Current Every Day Smoker    Packs/day: 1.00    Types: Cigarettes  . Smokeless tobacco: Never Used   Substance and Sexual Activity  . Alcohol use: Yes    Comment: occ:1-2X/WEEK-BEER  . Drug use: Never  . Sexual activity: Not on file  Lifestyle  . Physical activity:    Days per week: Not on file    Minutes per session: Not on file  . Stress: Not on file  Relationships  . Social connections:    Talks on phone: Not on file    Gets together: Not on file    Attends religious service: Not on file    Active member of club or organization: Not on file    Attends meetings of clubs or organizations: Not on file    Relationship status: Not on file  . Intimate partner violence:    Fear of current or ex partner: Not on file    Emotionally abused: Not on file    Physically abused: Not on file    Forced sexual activity: Not on file  Other Topics Concern  . Not on file  Social History Narrative  . Not on file    FAMILY HISTORY: Family History  Problem Relation Age of Onset  . Colon cancer Neg Hx   . Colon polyps Neg Hx     ALLERGIES:  is allergic to adhesive [tape].  MEDICATIONS:  Current Outpatient Medications  Medication Sig Dispense Refill  . atorvastatin (LIPITOR) 40 MG tablet Take 40 mg by mouth every evening.     . Cholecalciferol (VITAMIN D3) 5000 units CAPS Take 5,000 Units by mouth daily.    Marland Kitchen losartan (COZAAR) 100 MG tablet Take 100 mg by mouth daily.     Marland Kitchen omeprazole (PRILOSEC) 20 MG capsule 1 PO 30 MINS PRIOR TO BREAKFAST. (Patient taking differently: Take 20 mg by mouth daily. ) 30 capsule 3  . Verapamil HCl CR 200 MG CP24 Take 200 mg by mouth daily.     No current facility-administered medications for this visit.     REVIEW OF SYSTEMS:   Constitutional: Denies fevers, chills or abnormal night sweats Eyes: Denies blurriness of vision, double vision or watery eyes Ears, nose, mouth, throat, and face: Denies mucositis or sore throat Respiratory: Denies cough, dyspnea or wheezes Cardiovascular: Denies palpitation, chest discomfort or lower extremity  swelling Gastrointestinal:  Denies nausea, heartburn or change in bowel habits Skin: Denies abnormal skin rashes Lymphatics: Denies new lymphadenopathy or easy bruising Neurological:Denies numbness, tingling or new weaknesses Behavioral/Psych: Mood is stable, no new changes  All other systems were reviewed with the patient and are negative.  PHYSICAL EXAMINATION: ECOG PERFORMANCE STATUS: 0 - Asymptomatic  Vitals:   07/13/17 0950  BP: (!) 141/81  Pulse: 70  Resp: 18  Temp: 97.8 F (36.6 C)  SpO2: 97%   Filed Weights   07/13/17 0950  Weight: 200 lb (90.7 kg)    GENERAL:alert, no distress and comfortable SKIN: skin color, texture, turgor are normal, no rashes or significant lesions EYES: normal, conjunctiva are pink and non-injected, sclera  clear    LABORATORY DATA:  I have reviewed the data as listed Lab Results  Component Value Date   WBC 10.4 06/21/2017   HGB 16.2 06/21/2017   HCT 47.4 06/21/2017   MCV 90.1 06/21/2017   PLT 187 06/21/2017     Chemistry      Component Value Date/Time   NA 141 06/21/2017 2100   K 3.9 06/21/2017 2100   CL 104 06/21/2017 2100   CO2 24 06/21/2017 2100   BUN 13 06/21/2017 2100   CREATININE 0.91 06/21/2017 2100      Component Value Date/Time   CALCIUM 9.4 06/21/2017 2100       RADIOGRAPHIC STUDIES: I have personally reviewed EGD report dated 06/21/2017.  I have personally reviewed the images of the PET CT scan on 07/12/2017.  ASSESSMENT & PLAN:  Adenocarcinoma of gastroesophageal junction (HCC) 1.  GE junction poorly differentiated adenocarcinoma: - EGD on 06/21/2017 shows benign-appearing stenosis which was dilated in the distal esophagus.  Localized prominent gastric folds were found in the cardia, which was biopsied.  There was also localized mild inflammation with congestion and erythema in the antrum, biopsies were taken for H. pylori testing. - Stomach biopsy shows chronic gastritis with H. pylori positive.  Esophageal  gastric junction biopsy shows poorly differentiated adenocarcinoma. -I have discussed the findings of the pathology report with the patient and his wife in detail. - I have discussed the results of the PET CT scan dated 07/12/2017 with the patient and his wife in detail.  It showed hypermetabolic uptake in the distal esophagus, extending into GE junction.  The segment is approximately 4.2 cm Kramar.  I have recommended an EUS examination for accurate T and N staging.  Treatment options include combination chemoradiation therapy.  I will see him back after the EUS to discuss the results. -He has food stuck behind the sternum once yesterday when he was eating chicken.  He has been chewing thoroughly since then.  We have also talked about considering a port placement for chemotherapy administration.  We will make a referral to surgery.  No orders of the defined types were placed in this encounter.   All questions were answered. The patient knows to call the clinic with any problems, questions or concerns. Total time spent is 25 minutes with more than 50% of the time spent face-to-face discussing scan results and treatment options.  This note includes documentation from Mike Craze, NP, who was present during this patient's office visit and evaluation.  I have reviewed this note for its completeness and accuracy.  I have edited this note accordingly based on my findings and medical opinion.       Derek Jack, MD 07/13/2017 5:01 PM

## 2017-07-13 NOTE — Telephone Encounter (Addendum)
I received a call from [facility] from the cancer center at [facility] and Dr. Delton Coombes would like the patient to have an EUS done by Dr. Ardis Hughs and postponing the EGD until after EUS has been completed.   Routing to Dr. Oneida Alar to give order to cancel procedure.

## 2017-07-13 NOTE — Assessment & Plan Note (Addendum)
1.  GE junction poorly differentiated adenocarcinoma: - EGD on 06/21/2017 shows benign-appearing stenosis which was dilated in the distal esophagus.  Localized prominent gastric folds were found in the cardia, which was biopsied.  There was also localized mild inflammation with congestion and erythema in the antrum, biopsies were taken for H. pylori testing. - Stomach biopsy shows chronic gastritis with H. pylori positive.  Esophageal gastric junction biopsy shows poorly differentiated adenocarcinoma. -I have discussed the findings of the pathology report with the patient and his wife in detail. - I have discussed the results of the PET CT scan dated 07/12/2017 with the patient and his wife in detail.  It showed hypermetabolic uptake in the distal esophagus, extending into GE junction.  The segment is approximately 4.2 cm Granato.  I have recommended an EUS examination for accurate T and N staging.  Treatment options include combination chemoradiation therapy.  I will see him back after the EUS to discuss the results. -He has food stuck behind the sternum once yesterday when he was eating chicken.  He has been chewing thoroughly since then.  We have also talked about considering a port placement for chemotherapy administration.  We will make a referral to surgery.

## 2017-07-13 NOTE — Patient Instructions (Signed)
[place] at [facility]  Discharge Instructions:   Today you say Dr. Delton Coombes. _______________________________________________________________  Thank you for choosing [facility] at [facility] to provide your oncology and hematology care.  To afford each patient quality time with our providers, please arrive at least 15 minutes before your scheduled appointment.  You need to re-schedule your appointment if you arrive 10 or more minutes late.  We strive to give you quality time with our providers, and arriving late affects you and other patients whose appointments are after yours.  Also, if you no show three or more times for appointments you may be dismissed from the clinic.  Again, thank you for choosing [place] [facility] hope is that these requests will allow you access to exceptional care and in a timely manner. _______________________________________________________________  If you have questions after your visit, please contact our office at [phone] between the hours of 8:30 a.m. and 5:00 p.m. Voicemails left after 4:30 p.m. will not be returned until the following business day. _______________________________________________________________  For prescription refill requests, have your pharmacy contact our office. _______________________________________________________________  Recommendations made by the consultant and any test results will be sent to your referring physician. _______________________________________________________________

## 2017-07-13 NOTE — Telephone Encounter (Signed)
STAFF MESSAGE SENT TO DR. DAN JACOBS MAY 22:   Dan,  Good afternoon!   Oncology doc at [facility]: Delton Coombes is sending this pt to you FOR EUS.  I REMOVED FOOD IMPACTION AND NOTICED IRREGULAR MUCOSA JUST DISTAL TO THE Z LINE(CARDIA) WHICH I BIOPSIED. On the OP Note pic #3 is the area that was biopsied which is just below the Z line.   I performed an EGD/DIL TO 15 MM AND he was suppose to return to complete it but Dr. Raliegh Ip wants the EUS prior to additional dilation.  Feel free to contact me if you have any questions.  Take care, Zarianna Dicarlo

## 2017-07-13 NOTE — Telephone Encounter (Signed)
Procedure cancelled, Endoscopy notified and the patient is aware of cancellation

## 2017-07-13 NOTE — Telephone Encounter (Signed)
Route OP NOTE TO DR. Ardis Hughs. OK TO CANCEL EGD/DIL MAY 24. PT CAN HAVE EGD/DIL IF NEEDED FOLLOWED BY EUS WITH DAN JACOBS.

## 2017-07-14 ENCOUNTER — Telehealth: Payer: Self-pay

## 2017-07-14 ENCOUNTER — Other Ambulatory Visit: Payer: Self-pay

## 2017-07-14 DIAGNOSIS — C169 Malignant neoplasm of stomach, unspecified: Secondary | ICD-10-CM

## 2017-07-14 NOTE — Telephone Encounter (Signed)
EUS scheduled, pt instructed and medications reviewed.  Patient instructions mailed to home.  Patient to call with any questions or concerns.  

## 2017-07-14 NOTE — Telephone Encounter (Signed)
-----   Message from [name], MD sent at 07/14/2017  7:36 AM EDT ----- Carlyon Prows, Thanks. We'll get him scheduled.   Adrienne Mocha, He needs upper EUS, radial +/- linear for GE junction cancer staging.  +MAC, next available spot.  Thanks   ----- Message ----- From: Danie Binder, MD Sent: 07/13/2017  12:23 PM To: [name], MD  Illene Labrador afternoon!   Oncology doc at [facility]: Delton Coombes is sending this pt to you FOR EUS.  I REMOVED FOOD IMPACTION AND NOTICED IRREGULAR MUCOSA JUST DISTAL TO THE Z LINE(CARDIA) WHICH I BIOPSIED. On the OP Note pic #3 is the area that was biopsied which is just below the Z line.   I performed an EGD/DIL TO 15 MM AND he was suppose to return o complete it but Dr. Raliegh Ip wants the EUS prior to additional dilation.  Feel free to contact me if you have any questions.  Take care, Sandi

## 2017-07-15 ENCOUNTER — Encounter (HOSPITAL_COMMUNITY): Admission: RE | Payer: Self-pay | Source: Ambulatory Visit

## 2017-07-15 ENCOUNTER — Ambulatory Visit (HOSPITAL_COMMUNITY)
Admission: RE | Admit: 2017-07-15 | Payer: BLUE CROSS/BLUE SHIELD | Source: Ambulatory Visit | Admitting: Gastroenterology

## 2017-07-15 SURGERY — EGD (ESOPHAGOGASTRODUODENOSCOPY)
Anesthesia: Moderate Sedation

## 2017-07-17 ENCOUNTER — Encounter: Payer: Self-pay | Admitting: Gastroenterology

## 2017-07-21 ENCOUNTER — Encounter (HOSPITAL_COMMUNITY): Payer: Self-pay | Admitting: Emergency Medicine

## 2017-07-21 ENCOUNTER — Other Ambulatory Visit: Payer: Self-pay

## 2017-07-26 ENCOUNTER — Encounter: Payer: Self-pay | Admitting: General Surgery

## 2017-07-26 ENCOUNTER — Ambulatory Visit: Payer: BLUE CROSS/BLUE SHIELD | Admitting: General Surgery

## 2017-07-26 VITALS — BP 131/77 | HR 87 | Temp 98.4°F | Resp 18 | Wt 199.0 lb

## 2017-07-26 DIAGNOSIS — C160 Malignant neoplasm of cardia: Secondary | ICD-10-CM | POA: Diagnosis not present

## 2017-07-26 NOTE — Progress Notes (Signed)
[facility] History and Physical  Reason for Referral: Port placement /  Gastroesophageal junction cancer Referring Physician:  Dr. Delton Coombes   Chief Complaint    Gastric Cancer      Phillip Lane is a 62 y.o. male.  HPI: Phillip Lane is a 62 yo with a newly diagnosed  gastroesophageal junction cancer that was found after an EGD that was done for food impaction that brought him to the ED. Dr. Oneida Alar did and EGD and biopsies that demonstrated adenocarcinoma. He has undergone CT scan that demonstrated the lesion and has seen Oncology. He was sent to me regarding port a catheter placement. He is scheduled for EUS in the upcoming days.  He has not had any symptoms except for the food getting stuck. He denies any unintentional weight loss.  He says that he is overall feeling normal. He does have a history of pretty severe sleep apnea, and uses a sleep machine.   Past Medical History:  Diagnosis Date  . Hyperlipidemia   . Hypertension   . Sleep apnea, obstructive     Past Surgical History:  Procedure Laterality Date  . colonoscopy     [place] 36 poly[place], age 25: ? POLYPS, AGE 20: ? POLYPS  . ESOPHAGEAL DILATION N/A 06/21/2017   Procedure: ESOPHAGEAL DILATION;  Surgeon: Danie Binder, MD;  [facility];  Service: Endoscopy;  Laterality: N/A;  . ESOPHAGOGASTRODUODENOSCOPY N/A 06/21/2017   Procedure: ESOPHAGOGASTRODUODENOSCOPY (EGD);  Surgeon: Danie Binder, MD;  Location: [facility];  Service: Endoscopy;  Laterality: N/A;  . EYE SURGERY Bilateral    cataract    Family History  Problem Relation Age of Onset  . Hypertension Mother   . Hypertension Father   . Diabetes Father   . Colon cancer Neg Hx   . Colon polyps Neg Hx     Social History   Tobacco Use  . Smoking status: Current Every Day Smoker    Packs/day: 1.00    Types: Cigarettes  . Smokeless tobacco: Never Used  Substance Use Topics  . Alcohol use: Yes    Comment:  occ:1-2X/WEEK-BEER  . Drug use: Never    Medications: I have reviewed the patient's current medications. Allergies as of 07/26/2017      Reactions   Adhesive [tape] Other (See Comments)   Paper tape only      Medication List        Accurate as of 07/26/17 11:59 PM. Always use your most recent med list.          atorvastatin 40 MG tablet Commonly known as:  LIPITOR Take 40 mg by mouth every evening.   losartan 100 MG tablet Commonly known as:  COZAAR Take 100 mg by mouth daily.   omeprazole 20 MG capsule Commonly known as:  PRILOSEC 1 PO 30 MINS PRIOR TO BREAKFAST.   Verapamil HCl CR 200 MG Cp24 Take 200 mg by mouth daily.   Vitamin D3 5000 units Caps Take 5,000 Units by mouth daily.        ROS:  A comprehensive review of systems was negative except for: Eyes: positive for implants in eyes Gastrointestinal: positive for reflux symptoms  Blood pressure 131/77, pulse 87, temperature 98.4 F (36.9 C), temperature source Temporal, resp. rate 18, weight 199 lb (90.3 kg). Physical Exam  Constitutional: He is oriented to person, place, and time. He appears well-developed and well-nourished.  HENT:  Head: Normocephalic and atraumatic.  Eyes: Pupils are equal, round,  and reactive to light. EOM are normal.  Neck: Normal range of motion. Neck supple.  Cardiovascular: Normal rate and regular rhythm.  Pulmonary/Chest: Effort normal and breath sounds normal.  Abdominal: Soft. He exhibits no distension. There is no tenderness.  Musculoskeletal: Normal range of motion. He exhibits no edema.  Lymphadenopathy:    He has no cervical adenopathy.  Neurological: He is alert and oriented to person, place, and time.  Skin: Skin is warm and dry.  Psychiatric: He has a normal mood and affect. His behavior is normal. Judgment and thought content normal.  Vitals reviewed.   Results: PET scan 5/21 IMPRESSION: 1. Intense hypermetabolic activity in the distal esophagus extending to  the GE junction, corresponding with known adenocarcinoma. 2. No hypermetabolic regional adenopathy or distant metastases demonstrated. 3. Aortic Atherosclerosis (ICD10-I70.0) and Emphysema (ICD10-J43.9). 4. Aortic aneurysm NOS (ICD10-I71.9). Recommend followup by ultrasound in 3 years. This recommendation follows ACR consensus guidelines: White Paper of the ACR Incidental Findings Committee II on Vascular Findings. J Am Coll Radiol 2013; 10:789-794  EGD w/ Dr. Oneida Alar: - IMPACTED Food in the lower third of the esophagus RESULTING IN ESOPHAGITIS. Removal was successful. - Benign-appearing esophageal STRICTURE. Dilated. - Enlarged gastric folds AT THE CARDIA. Biopsied. - MILD Gastritis AND EROSIVE duodenitis.  Pathology: Diagnosis 1. 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 - WARTHIN-STARRY STAIN POSITIVE FOR HELICOBACTER PYLORI. - NO DYSPLASIA OR MALIGNANCY. 2. Esophagogastric junction, biopsy - POORLY DIFFERENTIATED ADENOCARCINOMA.  Microscopic Comment 2. The gastroesophageal biopsies are involved by poorly differentiated adenocarcinoma.  Assessment & Plan:  Phillip Lane is a 62 y.o. male with gastroesophageal junction cancer who is completing his workup and getting his EUS in the next few days. He has seen Dr. Delton Coombes  [facility] placement in the upcoming weeks. He is seeing Dr. 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 He prefers to try the right side first.   All questions were answered to the satisfaction of the patient and family.  The risk and benefits of port a catheter placement were discussed including but not limited to bleeding, infection, malfunction, pneumothorax.  After careful  consideration, Phillip Lane has decided to proceed.    Virl Cagey 07/27/2017, 10:52 AM

## 2017-07-26 NOTE — Patient Instructions (Signed)
Implanted Port Home Guide An implanted port is a type of central line that is placed under the skin. Central lines are used to provide IV access when treatment or nutrition needs to be given through a person's veins. Implanted ports are used for Kas[place]-term IV access. An implanted port may be placed because:  You need IV medicine that would be irritating to the small veins in your hands or arms.  You need Yarde-term IV medicines, such as antibiotics.  You need IV nutrition for a Ribble period.  You need frequent blood draws for lab tests.  You need dialysis.  Implanted ports are usually placed in the chest area, but they can also be placed in the upper arm, the abdomen, or the leg. An implanted port has two main parts:  Reservoir. The reservoir is round and will appear as a small, raised area under your skin. The reservoir is the part where a needle is inserted to give medicines or draw blood.  Catheter. The catheter is a thin, flexible tube that extends from the reservoir. The catheter is placed into a large vein. Medicine that is inserted into the reservoir goes into the catheter and then into the vein.  How will I care for my incision site? Do not get the incision site wet. Bathe or shower as directed by your health care provider. How is my port accessed? Special steps must be taken to access the port:  Before the port is accessed, a numbing cream can be placed on the skin. This helps numb the skin over the port site.  Your health care provider uses a sterile technique to access the port. ? Your health care provider must put on a mask and sterile gloves. ? The skin over your port is cleaned carefully with an antiseptic and allowed to dry. ? The port is gently pinched between sterile gloves, and a needle is inserted into the port.  Only "non-coring" port needles should be used to access the port. Once the port is accessed, a blood return should be checked. This helps ensure that the port  is in the vein and is not clogged.  If your port needs to remain accessed for a constant infusion, a clear (transparent) bandage will be placed over the needle site. The bandage and needle will need to be changed every week, or as directed by your health care provider.  Keep the bandage covering the needle clean and dry. Do not get it wet. Follow your health care provider's instructions on how to take a shower or bath while the port is accessed.  If your port does not need to stay accessed, no bandage is needed over the port.  What is flushing? Flushing helps keep the port from getting clogged. Follow your health care provider's instructions on how and when to flush the port. Ports are usually flushed with saline solution or a medicine called heparin. The need for flushing will depend on how the port is used.  If the port is used for intermittent medicines or blood draws, the port will need to be flushed: ? After medicines have been given. ? After blood has been drawn. ? As part of routine maintenance.  If a constant infusion is running, the port may not need to be flushed.  How [place] will my port stay implanted? The port can stay in for as [place] as your health care provider thinks it is needed. When it is time for the port to come out, surgery will be   done to remove it. The procedure is similar to the one performed when the port was put in. When should I seek immediate medical care? When you have an implanted port, you should seek immediate medical care if:  You notice a bad smell coming from the incision site.  You have swelling, redness, or drainage at the incision site.  You have more swelling or pain at the port site or the surrounding area.  You have a fever that is not controlled with medicine.  This information is not intended to replace advice given to you by your health care provider. Make sure you discuss any questions you have with your health care provider. Document  Released: 02/08/2005 Document Revised: 07/17/2015 Document Reviewed: 10/16/2012 Elsevier Interactive Patient Education  2017 Elsevier Inc.  

## 2017-07-27 ENCOUNTER — Encounter: Payer: Self-pay | Admitting: General Surgery

## 2017-07-27 NOTE — H&P (Signed)
[facility] History and Physical  Reason for Referral: Port placement /  Gastroesophageal junction cancer Referring Physician:  Dr. Delton Coombes      Chief Complaint    Gastric Cancer      Phillip Lane is a 62 y.o. male.  HPI: Mr. Trippe is a 62 yo with a newly diagnosed  gastroesophageal junction cancer that was found after an EGD that was done for food impaction that brought him to the ED. Dr. Oneida Alar did and EGD and biopsies that demonstrated adenocarcinoma. He has undergone CT scan that demonstrated the lesion and has seen Oncology. He was sent to me regarding port a catheter placement. He is scheduled for EUS in the upcoming days.  He has not had any symptoms except for the food getting stuck. He denies any unintentional weight loss.  He says that he is overall feeling normal. He does have a history of pretty severe sleep apnea, and uses a sleep machine.       Past Medical History:  Diagnosis Date  . Hyperlipidemia   . Hypertension   . Sleep apnea, obstructive          Past Surgical History:  Procedure Laterality Date  . colonoscopy     [place] 62 poly[place] removed, age 55: ? POLYPS, AGE 39: ? POLYPS  . ESOPHAGEAL DILATION N/A 06/21/2017   Procedure: ESOPHAGEAL DILATION;  Surgeon: Danie Binder, MD;  [facility];  Service: Endoscopy;  Laterality: N/A;  . ESOPHAGOGASTRODUODENOSCOPY N/A 06/21/2017   Procedure: ESOPHAGOGASTRODUODENOSCOPY (EGD);  Surgeon: Danie Binder, MD;  [facility];  Service: Endoscopy;  Laterality: N/A;  . EYE SURGERY Bilateral    cataract         Family History  Problem Relation Age of Onset  . Hypertension Mother   . Hypertension Father   . Diabetes Father   . Colon cancer Neg Hx   . Colon polyps Neg Hx     Social History        Tobacco Use  . Smoking status: Current Every Day Smoker    Packs/day: 1.00    Types: Cigarettes  . Smokeless tobacco: Never Used    Substance Use Topics  . Alcohol use: Yes    Comment: occ:1-2X/WEEK-BEER  . Drug use: Never    Medications: I have reviewed the patient's current medications.      Allergies as of 07/26/2017      Reactions   Adhesive [tape] Other (See Comments)   Paper tape only               Medication List            Accurate as of 07/26/17 11:59 PM. Always use your most recent med list.           atorvastatin 40 MG tablet Commonly known as:  LIPITOR Take 40 mg by mouth every evening.   losartan 100 MG tablet Commonly known as:  COZAAR Take 100 mg by mouth daily.   omeprazole 20 MG capsule Commonly known as:  PRILOSEC 1 PO 30 MINS PRIOR TO BREAKFAST.   Verapamil HCl CR 200 MG Cp24 Take 200 mg by mouth daily.   Vitamin D3 5000 units Caps Take 5,000 Units by mouth daily.        ROS:  A comprehensive review of systems was negative except for: Eyes: positive for implants in eyes Gastrointestinal: positive for reflux symptoms  Blood pressure 131/77, pulse 87, temperature 98.4 F (36.9  C), temperature source Temporal, resp. rate 18, weight 199 lb (90.3 kg). Physical Exam  Constitutional: He is oriented to person, place, and time. He appears well-developed and well-nourished.  HENT:  Head: Normocephalic and atraumatic.  Eyes: Pupils are equal, round, and reactive to light. EOM are normal.  Neck: Normal range of motion. Neck supple.  Cardiovascular: Normal rate and regular rhythm.  Pulmonary/Chest: Effort normal and breath sounds normal.  Abdominal: Soft. He exhibits no distension. There is no tenderness.  Musculoskeletal: Normal range of motion. He exhibits no edema.  Lymphadenopathy:    He has no cervical adenopathy.  Neurological: He is alert and oriented to person, place, and time.  Skin: Skin is warm and dry.  Psychiatric: He has a normal mood and affect. His behavior is normal. Judgment and thought content normal.  Vitals  reviewed.   Results: PET scan 5/21 IMPRESSION: 1. Intense hypermetabolic activity in the distal esophagus extending to the GE junction, corresponding with known adenocarcinoma. 2. No hypermetabolic regional adenopathy or distant metastases demonstrated. 3. Aortic Atherosclerosis (ICD10-I70.0) and Emphysema (ICD10-J43.9). 4. Aortic aneurysm NOS (ICD10-I71.9). Recommend followup by ultrasound in 3 years. This recommendation follows ACR consensus guidelines: White Paper of the ACR Incidental Findings Committee II on Vascular Findings. J Am Coll Radiol 2013; 10:789-794  EGD w/ Dr. Oneida Alar: - IMPACTED Food in the lower third of the esophagus RESULTING IN ESOPHAGITIS. Removal was successful. - Benign-appearing esophageal STRICTURE. Dilated. - Enlarged gastric folds AT THE CARDIA. Biopsied. - MILD Gastritis AND EROSIVE duodenitis.  Pathology: Diagnosis 1. 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 - WARTHIN-STARRY STAIN POSITIVE FOR HELICOBACTER PYLORI. - NO DYSPLASIA OR MALIGNANCY. 2. Esophagogastric junction, biopsy - POORLY DIFFERENTIATED ADENOCARCINOMA.  Microscopic Comment 2. The gastroesophageal biopsies are involved by poorly differentiated adenocarcinoma.  Assessment & Plan:  Phillip Lane is a 62 y.o. male with gastroesophageal junction cancer who is completing his workup and getting his EUS in the next few days. He has seen Dr. Delton Coombes  [facility] placement in the upcoming weeks. He is seeing Dr. 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 He prefers to try the right side first.   All questions were answered to the satisfaction of the patient and family.  The risk and benefits of  port a catheter placement were discussed including but not limited to bleeding, infection, malfunction, pneumothorax.  After careful consideration, Phillip Lane has decided to proceed.    Virl Cagey 07/27/2017, 10:52 AM

## 2017-07-28 ENCOUNTER — Ambulatory Visit (HOSPITAL_COMMUNITY): Payer: BLUE CROSS/BLUE SHIELD | Admitting: Certified Registered Nurse Anesthetist

## 2017-07-28 ENCOUNTER — Ambulatory Visit (HOSPITAL_COMMUNITY)
Admission: RE | Admit: 2017-07-28 | Discharge: 2017-07-28 | Disposition: A | Discharge status: Home / Self Care | Payer: BLUE CROSS/BLUE SHIELD | Source: Ambulatory Visit | Attending: Gastroenterology | Admitting: Gastroenterology

## 2017-07-28 ENCOUNTER — Encounter (HOSPITAL_COMMUNITY)
Admission: RE | Disposition: A | Discharge status: Home / Self Care | Payer: Self-pay | Source: Ambulatory Visit | Attending: Gastroenterology

## 2017-07-28 ENCOUNTER — Encounter (HOSPITAL_COMMUNITY): Payer: Self-pay

## 2017-07-28 DIAGNOSIS — K228 Other specified diseases of esophagus: Secondary | ICD-10-CM | POA: Diagnosis not present

## 2017-07-28 DIAGNOSIS — I10 Essential (primary) hypertension: Secondary | ICD-10-CM | POA: Diagnosis not present

## 2017-07-28 DIAGNOSIS — E785 Hyperlipidemia, unspecified: Secondary | ICD-10-CM | POA: Insufficient documentation

## 2017-07-28 DIAGNOSIS — D490 Neoplasm of unspecified behavior of digestive system: Secondary | ICD-10-CM

## 2017-07-28 DIAGNOSIS — C169 Malignant neoplasm of stomach, unspecified: Secondary | ICD-10-CM

## 2017-07-28 DIAGNOSIS — C160 Malignant neoplasm of cardia: Secondary | ICD-10-CM

## 2017-07-28 DIAGNOSIS — G4733 Obstructive sleep apnea (adult) (pediatric): Secondary | ICD-10-CM | POA: Diagnosis not present

## 2017-07-28 DIAGNOSIS — C159 Malignant neoplasm of esophagus, unspecified: Secondary | ICD-10-CM

## 2017-07-28 DIAGNOSIS — F17210 Nicotine dependence, cigarettes, uncomplicated: Secondary | ICD-10-CM | POA: Diagnosis not present

## 2017-07-28 HISTORY — PX: EUS: SHX5427

## 2017-07-28 SURGERY — UPPER ENDOSCOPIC ULTRASOUND (EUS) RADIAL
Anesthesia: Monitor Anesthesia Care

## 2017-07-28 MED ORDER — LIDOCAINE 2% (20 MG/ML) 5 ML SYRINGE
INTRAMUSCULAR | Status: DC | PRN
  Administered 2017-07-28: 40 mg via INTRAVENOUS
  Administered 2017-07-28: 60 mg via INTRAVENOUS

## 2017-07-28 MED ORDER — LACTATED RINGERS IV SOLN
INTRAVENOUS | Status: DC | PRN
  Administered 2017-07-28: 12:00:00 via INTRAVENOUS

## 2017-07-28 MED ORDER — PROPOFOL 500 MG/50ML IV EMUL
INTRAVENOUS | Status: DC | PRN
  Administered 2017-07-28: 150 ug/kg/min via INTRAVENOUS

## 2017-07-28 MED ORDER — PROPOFOL 10 MG/ML IV BOLUS
INTRAVENOUS | Status: DC | PRN
  Administered 2017-07-28 (×4): 20 mg via INTRAVENOUS

## 2017-07-28 MED ORDER — SODIUM CHLORIDE 0.9 % IV SOLN: INTRAVENOUS | Status: DC

## 2017-07-28 MED ORDER — PROPOFOL 10 MG/ML IV BOLUS
INTRAVENOUS | Status: AC
  Filled 2017-07-28: qty 40

## 2017-07-28 NOTE — Anesthesia Postprocedure Evaluation (Signed)
Anesthesia Post Note  Patient: Phillip Lane  Procedure(s) Performed: UPPER ENDOSCOPIC ULTRASOUND (EUS) RADIAL (N/A )     Patient location during evaluation: Endoscopy Anesthesia Type: MAC Level of consciousness: awake and alert Pain management: pain level controlled Vital Signs Assessment: post-procedure vital signs reviewed and stable Respiratory status: spontaneous breathing, nonlabored ventilation and respiratory function stable Cardiovascular status: stable and blood pressure returned to baseline Postop Assessment: no apparent nausea or vomiting Anesthetic complications: no    Last Vitals:  Vitals:   07/28/17 1210 07/28/17 1220  BP: 102/67 106/69  Pulse: 66 66  Resp: (!) 22 13  Temp:    SpO2: 96% 97%    Last Pain:  Vitals:   07/28/17 1206  TempSrc: Oral  PainSc: 0-No pain                 Catalina Gravel

## 2017-07-28 NOTE — Op Note (Signed)
[facility] Patient Name: Phillip Lane Procedure Date: 07/28/2017 MRN: [id number] Attending MD: [name] , MD Date of Birth: 04-21-1955 CSN: [id number] Age: 61 Admit Type: Outpatient Procedure:                Upper EUS Indications:              Recent EGD for acute esophageal food impaction                            found stricture, biopsies + for adenocarcinoma. PET                            shows 4cm Koper hypermetabolic mass at GE junction                            without distant disease. Providers:                [name], MD, Angus Seller, William Dalton, Technician Referring MD:             Dr. Delton Coombes and Dr. Oneida Alar Medicines:                Monitored Anesthesia Care Complications:            No immediate complications. Estimated blood loss:                            None. Estimated Blood Loss:     Estimated blood loss: none. Procedure:                Pre-Anesthesia Assessment:                           - Prior to the procedure, a History and Physical                            was performed, and patient medications and                            allergies were reviewed. The patient's tolerance of                            previous anesthesia was also reviewed. The risks                            and benefits of the procedure and the sedation                            options and risks were discussed with the patient.                            All questions were answered, and informed consent  was obtained. Prior Anticoagulants: The patient has                            taken no previous anticoagulant or antiplatelet                            agents. ASA Grade Assessment: II - A patient with                            mild systemic disease. After reviewing the risks                            and benefits, the patient was deemed in                            satisfactory condition  to undergo the procedure.                           After obtaining informed consent, the endoscope was                            passed under direct vision. Throughout the                            procedure, the patient's blood pressure, pulse, and                            oxygen saturations were monitored continuously. The                            [id number] ([id number]) was introduced through the                            mouth, and advanced to the lower third of                            esophagus. The [id number] ([id number]) scope was                            introduced through the mouth, and advanced to the                            lower third of esophagus. The upper EUS was                            accomplished without difficulty. The patient                            tolerated the procedure well. Findings:      ENDOSCOPIC FINDING: :      1. Tight, nearly obstructing malignant stricture in the distal       esophagus, presumed to be at the GE junction. I was unable to advance       standard adult gastroscope or the much larger  diameter radial       echoendoscope through the stricture (lumen 1-26mm, see images).      ENDOSONOGRAPHIC FINDING: :      1. I was only above to evaluated the proximal end of the malignant       stricture described above however the lesion is clearly circumferential,       at least 1.3cm thick and at least passes into and through the muscularis       propria layer (uT3).      2. Incomplete local nodal evaluation, uNX Impression:               - The GE junction adenocarcinoma is causing a near                            complete stricture through which I was unable to                            advance standard gastroscope or a radial                            echoendoscope. I was however, able to evaluate the                            proximal end of the mass and it is clearly AT LEAST                            uT3Nx                           -  He will likely benefit from neoadjuvant chemo/XRT                           - He and his wife know that he should stop trying                            to eat solid food for now and push high calorie                            full liquid type drinks (ensure, boost, carnation                            instant breakfast). Moderate Sedation:      N/A- Per Anesthesia Care Recommendation:           - I will communicate these results with referring                            team. Procedure Code(s):        --- Professional ---                           [phone], Esophagoscopy, flexible, transoral; with                            endoscopic ultrasound examination Diagnosis Code(s):        ---  Professional ---                           D49.0, Neoplasm of unspecified behavior of                            digestive system                           K22.8, Other specified diseases of esophagus                           C15.9, Malignant neoplasm of esophagus, unspecified CPT copyright 2017 American Medical Association. All rights reserved. The codes documented in this report are preliminary and upon coder review may  be revised to meet current compliance requirements. [name], MD 07/28/2017 12:20:27 PM This report has been signed electronically. Number of Addenda: 0

## 2017-07-28 NOTE — Transfer of Care (Signed)
Immediate Anesthesia Transfer of Care Note  Patient: Phillip Lane  Procedure(s) Performed: UPPER ENDOSCOPIC ULTRASOUND (EUS) RADIAL (N/A )  Patient Location: PACU  Anesthesia Type:MAC  Level of Consciousness: awake, alert  and oriented  Airway & Oxygen Therapy: Patient Spontanous Breathing and Patient connected to nasal cannula oxygen  Post-op Assessment: Report given to RN and Post -op Vital signs reviewed and stable  Post vital signs: Reviewed and stable  Last Vitals:  Vitals Value Taken Time  BP 100/64 07/28/2017 12:06 PM  Temp    Pulse 66 07/28/2017 12:07 PM  Resp 24 07/28/2017 12:07 PM  SpO2 96 % 07/28/2017 12:07 PM  Vitals shown include unvalidated device data.  Last Pain:  Vitals:   07/28/17 1206  TempSrc:   PainSc: 0-No pain         Complications: No apparent anesthesia complications

## 2017-07-28 NOTE — Anesthesia Preprocedure Evaluation (Signed)
Anesthesia Evaluation  Patient identified by MRN, date of birth, ID band Patient awake    Reviewed: Allergy & Precautions, NPO status , Patient's Chart, lab work & pertinent test results  Airway Mallampati: II  TM Distance: >3 FB Neck ROM: Full    Dental  (+) Dental Advisory Given, Edentulous Upper, Edentulous Lower   Pulmonary sleep apnea and Continuous Positive Airway Pressure Ventilation ,    Pulmonary exam normal breath sounds clear to auscultation       Cardiovascular hypertension, Pt. on medications Normal cardiovascular exam Rhythm:Regular Rate:Normal     Neuro/Psych negative neurological ROS     GI/Hepatic Neg liver ROS, GE Junction cancer staging   Endo/Other  negative endocrine ROS  Renal/GU negative Renal ROS     Musculoskeletal negative musculoskeletal ROS (+)   Abdominal   Peds  Hematology negative hematology ROS (+)   Anesthesia Other Findings Day of surgery medications reviewed with the patient.  Reproductive/Obstetrics                             Anesthesia Physical Anesthesia Plan  ASA: III  Anesthesia Plan: MAC   Post-op Pain Management:    Induction: Intravenous  PONV Risk Score and Plan: 0  Airway Management Planned: Nasal Cannula  Additional Equipment:   Intra-op Plan:   Post-operative Plan:   Informed Consent: I have reviewed the patients History and Physical, chart, labs and discussed the procedure including the risks, benefits and alternatives for the proposed anesthesia with the patient or authorized representative who has indicated his/her understanding and acceptance.   Dental advisory given  Plan Discussed with: CRNA and Anesthesiologist  Anesthesia Plan Comments: (Discussed risks/benefits/alternatives to MAC sedation including need for ventilatory support, hypotension, need for conversion to general anesthesia.  All patient questions answered.   Patient/guardian wishes to proceed.)        Anesthesia Quick Evaluation

## 2017-07-28 NOTE — Interval H&P Note (Signed)
History and Physical Interval Note:  07/28/2017 11:23 AM  Phillip Lane  has presented today for surgery, with the diagnosis of GE Junction cancer staging  The various methods of treatment have been discussed with the patient and family. After consideration of risks, benefits and other options for treatment, the patient has consented to  Procedure(s): UPPER ENDOSCOPIC ULTRASOUND (EUS) RADIAL (N/A) as a surgical intervention .  The patient's history has been reviewed, patient examined, no change in status, stable for surgery.  I have reviewed the patient's chart and labs.  Questions were answered to the patient's satisfaction.     [name]

## 2017-07-28 NOTE — Discharge Instructions (Signed)
Esophagogastroduodenoscopy, Care After °Refer to this sheet in the next few weeks. These instructions provide you with information about caring for yourself after your procedure. Your health care provider may also give you more specific instructions. Your treatment has been planned according to current medical practices, but problems sometimes occur. Call your health care provider if you have any problems or questions after your procedure. °What can I expect after the procedure? °After the procedure, it is common to have: °· A sore throat. °· Nausea. °· Bloating. °· Dizziness. °· Fatigue. ° °Follow these instructions at home: °· Do not eat or drink anything until the numbing medicine (local anesthetic) has worn off and your gag reflex has returned. You will know that the local anesthetic has worn off when you can swallow comfortably. °· Do not drive for 24 hours if you received a medicine to help you relax (sedative). °· If your health care provider took a tissue sample for testing during the procedure, make sure to get your test results. This is your responsibility. Ask your health care provider or the department performing the test when your results will be ready. °· Keep all follow-up visits as told by your health care provider. This is important. °Contact a health care provider if: °· You cannot stop coughing. °· You are not urinating. °· You are urinating less than usual. °Get help right away if: °· You have trouble swallowing. °· You cannot eat or drink. °· You have throat or chest pain that gets worse. °· You are dizzy or light-headed. °· You faint. °· You have nausea or vomiting. °· You have chills. °· You have a fever. °· You have severe abdominal pain. °· You have black, tarry, or bloody stools. °This information is not intended to replace advice given to you by your health care provider. Make sure you discuss any questions you have with your health care provider. °Document Released: 01/26/2012 Document  Revised: 07/17/2015 Document Reviewed: 01/02/2015 °Elsevier Interactive Patient Education © 2018 Elsevier Inc. ° °

## 2017-07-29 ENCOUNTER — Encounter (HOSPITAL_COMMUNITY): Payer: Self-pay | Admitting: Gastroenterology

## 2017-07-29 ENCOUNTER — Other Ambulatory Visit (HOSPITAL_COMMUNITY): Payer: Self-pay | Admitting: Hematology

## 2017-07-29 ENCOUNTER — Other Ambulatory Visit (HOSPITAL_COMMUNITY): Payer: Self-pay

## 2017-07-29 ENCOUNTER — Telehealth: Payer: Self-pay | Admitting: General Surgery

## 2017-07-29 ENCOUNTER — Encounter (HOSPITAL_COMMUNITY): Payer: Self-pay | Admitting: Hematology

## 2017-07-29 ENCOUNTER — Telehealth (HOSPITAL_COMMUNITY): Payer: Self-pay

## 2017-07-29 DIAGNOSIS — C160 Malignant neoplasm of cardia: Secondary | ICD-10-CM

## 2017-07-29 NOTE — Telephone Encounter (Signed)
Phillip Lane from Dr. Constance Haw office called and said patients wife had called and wanted to move port appt up. Explained to [place] the results of the EUS and Dr. Marthann Schiller plan. She called and spoke with Dr. Constance Haw then called me back. Phillip Lane states Dr. Constance Haw is going to talk with Dr. Raliegh Ip. Dr. Constance Haw wants patient to keep appt with Dr. Raliegh Ip on 6/10. Dr. Constance Haw and Dr. Raliegh Ip will be back in contact with each other after patients appt with Dr. Raliegh Ip on 6/10. Dr. Constance Haw will see if she can move port appt to Wednesday (6/12) and possibly place a J tube at the same time.

## 2017-07-29 NOTE — Telephone Encounter (Signed)
[facility] for J tube and Port on Wednesday.  If patient worsens and cannot take in any liquids, they will go to the Ed and be admitted to hospitalist.   Will discuss further once see the patient on Wednesday.  Labs on Monday with Oncology Visit.   Talked to [place], wife.   Curlene Labrum, MD [facility] [address] [place], [place] [id number] [phone] (office)

## 2017-07-29 NOTE — Telephone Encounter (Signed)
Patients wife called stating patient is unable to swallow solid foods or pills. Called wife back to see exactly what is going on. She states patient was swallowing okay on Wednesday because they ate at [facility] and he had no trouble. He had EUS performed yesterday and since then has not been able to swallow solids. He is having difficulty swallowing pills also. Reviewed with Dr. Delton Coombes. He reviewed Dr. Ardis Hughs EUS note. Dr. Raliegh Ip said for patient to stop trying to eat solid foods and only drink ensure, boost, and carnation instant breakfast. He can crush his pills and mix in liquid. Dr. Raliegh Ip also said to get patient a stat referral to radiation oncology in [place]. Dr. Raliegh Ip spoke with radiation oncologist there and states he will be seen next week. Referral faxed by scheduling. Dr. Raliegh Ip said to change his appt to next week. New appt is 07/23/17 at 1110. If patient develops further difficulty swallowing, he is to go to ER. Dr. Raliegh Ip states the fastest way for patient to get a J tube would be admission. Notified patients wife of the above plans. She verbalized understanding and states she will try to get his port a cath appt moved to next week also. It is currently scheduled for 08/08/17.

## 2017-07-29 NOTE — Progress Notes (Unsigned)
Faxed referral and patient records to [facility]. °

## 2017-08-01 ENCOUNTER — Other Ambulatory Visit: Payer: Self-pay

## 2017-08-01 ENCOUNTER — Other Ambulatory Visit (HOSPITAL_COMMUNITY): Payer: BLUE CROSS/BLUE SHIELD

## 2017-08-01 ENCOUNTER — Encounter (HOSPITAL_COMMUNITY): Payer: Self-pay | Admitting: Hematology

## 2017-08-01 ENCOUNTER — Inpatient Hospital Stay (HOSPITAL_COMMUNITY): Payer: BLUE CROSS/BLUE SHIELD | Attending: Hematology | Admitting: Hematology

## 2017-08-01 ENCOUNTER — Inpatient Hospital Stay (HOSPITAL_COMMUNITY): Payer: BLUE CROSS/BLUE SHIELD

## 2017-08-01 DIAGNOSIS — C160 Malignant neoplasm of cardia: Secondary | ICD-10-CM

## 2017-08-01 DIAGNOSIS — R5383 Other fatigue: Secondary | ICD-10-CM

## 2017-08-01 DIAGNOSIS — Z79899 Other long term (current) drug therapy: Secondary | ICD-10-CM | POA: Diagnosis not present

## 2017-08-01 DIAGNOSIS — I10 Essential (primary) hypertension: Secondary | ICD-10-CM | POA: Diagnosis not present

## 2017-08-01 DIAGNOSIS — R1310 Dysphagia, unspecified: Secondary | ICD-10-CM | POA: Insufficient documentation

## 2017-08-01 DIAGNOSIS — E785 Hyperlipidemia, unspecified: Secondary | ICD-10-CM | POA: Insufficient documentation

## 2017-08-01 DIAGNOSIS — G4733 Obstructive sleep apnea (adult) (pediatric): Secondary | ICD-10-CM | POA: Insufficient documentation

## 2017-08-01 LAB — COMPREHENSIVE METABOLIC PANEL
ALBUMIN: 4.2 g/dL (ref 3.5–5.0)
ALT: 24 U/L (ref 17–63)
ANION GAP: 9 (ref 5–15)
AST: 20 U/L (ref 15–41)
Alkaline Phosphatase: 85 U/L (ref 38–126)
BUN: 15 mg/dL (ref 6–20)
CHLORIDE: 107 mmol/L (ref 101–111)
CO2: 27 mmol/L (ref 22–32)
Calcium: 9.7 mg/dL (ref 8.9–10.3)
Creatinine, Ser: 0.97 mg/dL (ref 0.61–1.24)
GFR calc Af Amer: >60 mL/min (ref >60)
GFR calc non Af Amer: >60 mL/min (ref >60)
GLUCOSE: 129 mg/dL — AB (ref 65–99)
POTASSIUM: 3.9 mmol/L (ref 3.5–5.1)
SODIUM: 143 mmol/L (ref 135–145)
Total Bilirubin: 0.8 mg/dL (ref 0.3–1.2)
Total Protein: 7.1 g/dL (ref 6.5–8.1)

## 2017-08-01 LAB — CBC WITH DIFFERENTIAL/PLATELET
Basophils Absolute: 0.1 10*3/uL (ref 0.0–0.1)
Basophils Relative: 1 %
Eosinophils Absolute: 0.3 10*3/uL (ref 0.0–0.7)
Eosinophils Relative: 4 %
HEMATOCRIT: 48.8 % (ref 39.0–52.0)
Hemoglobin: 16.4 g/dL (ref 13.0–17.0)
LYMPHS PCT: 29 %
Lymphs Abs: 2.5 10*3/uL (ref 0.7–4.0)
MCH: 31.1 pg (ref 26.0–34.0)
MCHC: 33.6 g/dL (ref 30.0–36.0)
MCV: 92.4 fL (ref 78.0–100.0)
MONOS PCT: 9 %
Monocytes Absolute: 0.8 10*3/uL (ref 0.1–1.0)
NEUTROS ABS: 5 10*3/uL (ref 1.7–7.7)
Neutrophils Relative %: 57 %
Platelets: 187 10*3/uL (ref 150–400)
RBC: 5.28 MIL/uL (ref 4.22–5.81)
RDW: 13.1 % (ref 11.5–15.5)
WBC: 8.7 10*3/uL (ref 4.0–10.5)

## 2017-08-01 NOTE — Progress Notes (Signed)
START ON PATHWAY REGIMEN - Gastroesophageal     Administer weekly during RT:     Paclitaxel      Carboplatin   **Always confirm dose/schedule in your pharmacy ordering system**  Patient Characteristics: Esophageal & GE Junction, Adenocarcinoma, Preoperative or Nonsurgical Candidate (Clinical Staging), cT2 or Higher or cN+, Surgical Candidate (Up to cT4a) - Preoperative Therapy, GE Junction Histology: Adenocarcinoma Disease Classification: GE Junction Therapeutic Status: Preoperative or Nonsurgical Candidate (Clinical Staging) AJCC Grade: G3 AJCC 8 Stage Grouping: Unknown AJCC T Category: cT3 AJCC N Category: cNX AJCC M Category: cM0 Intent of Therapy: Curative Intent, Discussed with Patient

## 2017-08-01 NOTE — Progress Notes (Signed)
[place], [place] [address]   CLINIC:  Medical Oncology/Hematology  PCP:  Curlene Labrum, MD [place] [phone]   REASON FOR VISIT:  Follow-up for GE junction adenocarcinoma  CURRENT THERAPY: In preparation for chemoradiation therapy.  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         CANCER STAGING: Cancer Staging No matching staging information was found for the patient.   INTERVAL HISTORY:  Phillip Lane 62 y.o. male returns for follow-up after EGD/EUS.  He started developing difficulty with drinking thick liquids.  He was told to not to eat any solid foods.  He is able to drink coffee easily.  He denies any tingling or numbness in extremities.  Denies any regurgitation.  Denies any significant weight loss.  He is drinking 2 ensures every 4 hours.  Mild fatigue is stable.    REVIEW OF SYSTEMS:  Review of Systems  Constitutional: Positive for fatigue.  HENT:   Positive for trouble swallowing.   All other systems reviewed and are negative.    PAST MEDICAL/SURGICAL HISTORY:  Past Medical History:  Diagnosis Date  . Hyperlipidemia   . Hypertension   . Sleep apnea, obstructive    Past Surgical History:  Procedure Laterality Date  . colonoscopy     [place] 77 polyps [place], age 23: ? POLYPS, AGE 51: ? POLYPS  . ESOPHAGEAL DILATION N/A 06/21/2017   Procedure: ESOPHAGEAL  DILATION;  Surgeon: Danie Binder, MD;  Location[facility];  Service: Endoscopy;  Laterality: N/A;  . ESOPHAGOGASTRODUODENOSCOPY N/A 06/21/2017   Procedure: ESOPHAGOGASTRODUODENOSCOPY (EGD);  Surgeon: Danie Binder, MD;  Location: [facility];  Service: Endoscopy;  Laterality: N/A;  . EUS N/A 07/28/2017   Procedure: UPPER ENDOSCOPIC ULTRASOUND (EUS) RADIAL;  Surgeon: [name], MD;  Location: [facility];  Service: Endoscopy;  Laterality: N/A;  . EYE SURGERY Bilateral    cataract     SOCIAL HISTORY:  Social History   Socioeconomic History  . Marital status: Married    Spouse name: Not on file  . Number of children: Not on file  . Years of education: Not on file  . Highest education level: Not on file  Occupational History  . Not on file  Social Needs  . Financial resource strain: Not on file  . Food insecurity:    Worry: Not on file    Inability: Not on file  . Transportation needs:    Medical: Not on file    Non-medical: Not on file  Tobacco Use  . Smoking status: Current Every Day Smoker    Packs/day: 1.00    Types: Cigarettes  . Smokeless tobacco: Never Used  Substance and Sexual Activity  . Alcohol use: Yes    Comment: occ:1-2X/WEEK-BEER  . Drug use: Never  . Sexual activity: Not on file  Lifestyle  . Physical activity:  Days per week: Not on file    Minutes per session: Not on file  . Stress: Not on file  Relationships  . Social connections:    Talks on phone: Not on file    Gets together: Not on file    Attends religious service: Not on file    Active member of club or organization: Not on file    Attends meetings of clubs or organizations: Not on file    Relationship status: Not on file  . Intimate partner violence:    Fear of current or ex partner: Not on file    Emotionally abused: Not on file    Physically abused: Not on file    Forced sexual activity: Not on file  Other Topics Concern  . Not on file  Social History Narrative  .  Not on file    FAMILY HISTORY:  Family History  Problem Relation Age of Onset  . Hypertension Mother   . Hypertension Father   . Diabetes Father   . Colon cancer Neg Hx   . Colon polyps Neg Hx     CURRENT MEDICATIONS:  Outpatient Encounter Medications as of 08/01/2017  Medication Sig  . atorvastatin (LIPITOR) 40 MG tablet Take 40 mg by mouth every evening.   . Cholecalciferol (VITAMIN D3) 5000 units CAPS Take 5,000 Units by mouth daily.  Marland Kitchen losartan (COZAAR) 100 MG tablet Take 100 mg by mouth daily.   Marland Kitchen omeprazole (PRILOSEC) 20 MG capsule 1 PO 30 MINS PRIOR TO BREAKFAST. (Patient taking differently: Take 20 mg by mouth daily. )  . Verapamil HCl CR 200 MG CP24 Take 200 mg by mouth daily.   No facility-administered encounter medications on file as of 08/01/2017.     ALLERGIES:  Allergies  Allergen Reactions  . Adhesive [Tape] Other (See Comments)    Paper tape only     PHYSICAL EXAM:  ECOG Performance status: 0  Vitals:   08/01/17 1132  BP: (!) 116/54  Pulse: 92  Resp: 16  Temp: (!) 97.5 F (36.4 C)  SpO2: 96%   Filed Weights   08/01/17 1132  Weight: 195 lb 6.4 oz (88.6 kg)    Physical Exam  Deferred. LABORATORY DATA:  I have reviewed the labs as listed.  CBC    Component Value Date/Time   WBC 8.7 08/01/2017 0900   RBC 5.28 08/01/2017 0900   HGB 16.4 08/01/2017 0900   HCT 48.8 08/01/2017 0900   PLT 187 08/01/2017 0900   MCV 92.4 08/01/2017 0900   MCH 31.1 08/01/2017 0900   MCHC 33.6 08/01/2017 0900   RDW 13.1 08/01/2017 0900   LYMPHSABS 2.5 08/01/2017 0900   MONOABS 0.8 08/01/2017 0900   EOSABS 0.3 08/01/2017 0900   BASOSABS 0.1 08/01/2017 0900   CMP Latest Ref Rng & Units 08/01/2017 06/21/2017  Glucose 65 - 99 mg/dL 129(H) 96  BUN 6 - 20 mg/dL 15 13  Creatinine 0.61 - 1.24 mg/dL 0.97 0.91  Sodium 135 - 145 mmol/L 143 141  Potassium 3.5 - 5.1 mmol/L 3.9 3.9  Chloride 101 - 111 mmol/L 107 104  CO2 22 - 32 mmol/L 27 24  Calcium 8.9 - 10.3 mg/dL  9.7 9.4  Total Protein 6.5 - 8.1 g/dL 7.1 -  Total Bilirubin 0.3 - 1.2 mg/dL 0.8 -  Alkaline Phos 38 - 126 U/L 85 -  AST 15 - 41 U/L 20 -  ALT 17 - 63 U/L 24 -       DIAGNOSTIC  IMAGING:  I have reviewed the images of EGD/EUS dated 07/28/2017.     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  GE junction poorly differentiated adenocarcinoma: - EGD on 06/21/2017 shows benign-appearing stenosis which was dilated in the distal esophagus.  Localized prominent gastric folds were found in the cardia, which was biopsied.  There was also localized mild inflammation with congestion and erythema in the antrum, biopsies were taken for H. pylori testing. - Stomach biopsy shows chronic gastritis with H. pylori positive.  Esophageal gastric junction biopsy shows poorly differentiated adenocarcinoma. -I have discussed the findings of the pathology report with the patient and his wife in detail. -PET CT scan dated 07/12/2017 showed hypermetabolic uptake in the distal esophagus, extending into GE junction.  The segment is approximately 4.2 cm Cha[place]z. - On 07/28/2017 EGD/EUS done by Dr. Edison Nasuti showed circumferential tight, nearly obstructing stricture at GE junction, 1.3 cm thick, with the lumen of 1 to 2 mm.  UT3 NX. -Because of the impending complete closure of the lumen, he was told not to eat any solid foods.  It is taking a little longer for Ensure to get down.  Hence we have recommended a J-tube placement prior to start of therapy.  He has an appointment to see Dr. Quitman Livings tomorrow.  He will have J-tube and port placed on Wednesday by Dr. Constance Haw.  We will also make a referral to CT surgery as pretreatment evaluation. - I have discussed weekly chemotherapy with carboplatin and paclitaxel given concurrently with radiation therapy.  We talked about the side effects in detail.  We have given an appointment with our chemo education nurse.  Total time spent is 45 minutes with more  than 50% of the time spent face-to-face discussing treatment plan, side effects and coordination of care.    Orders placed this encounter:  No orders of the defined types were placed in this encounter.     Derek Jack, MD [facility] [phone]

## 2017-08-01 NOTE — Assessment & Plan Note (Addendum)
1.  GE junction poorly differentiated adenocarcinoma (uT3uNx): - EGD on 06/21/2017 shows benign-appearing stenosis which was dilated in the distal esophagus.  Localized prominent gastric folds were found in the cardia, which was biopsied.  There was also localized mild inflammation with congestion and erythema in the antrum, biopsies were taken for H. pylori testing. - Stomach biopsy shows chronic gastritis with H. pylori positive.  Esophageal gastric junction biopsy shows poorly differentiated adenocarcinoma. -I have discussed the findings of the pathology report with the patient and his wife in detail. -PET CT scan dated 07/12/2017 showed hypermetabolic uptake in the distal esophagus, extending into GE junction.  The segment is approximately 4.2 cm C[place]ino. - On 07/28/2017 EGD/EUS done by Dr. Edison Nasuti showed circumferential tight, nearly obstructing stricture at GE junction, 1.3 cm thick, with the lumen of 1 to 2 mm.  UT3 NX. -Because of the impending complete closure of the lumen, he was told not to eat any solid foods.  It is taking a little longer for Ensure to get down.  Hence we have recommended a J-tube placement prior to start of therapy.  He has an appointment to see Dr. Quitman Livings tomorrow.  He will have J-tube and port placed on Wednesday by Dr. Constance Haw.  We will also make a referral to CT surgery as pretreatment evaluation. - I have discussed weekly chemotherapy with carboplatin and paclitaxel given concurrently with radiation therapy.  We talked about the side effects in detail.  We have given an appointment with our chemo education nurse.

## 2017-08-01 NOTE — Patient Instructions (Signed)
[place] at [facility] Discharge Instructions  You were seen today by Dr. Raliegh Ip. He is referring you to CT surgery in [place]. Referring you to Dr. Constance Haw for [place] and port placement. Chemo teaching to be scheduled. Expect 5-6 weeks of weekly radiation and Carbo/Taxol Chemotherapy. Dr. Raliegh Ip will follow up with you 1 week after your first treatment.    Thank you for choosing [place] at [facility] to provide your oncology and hematology care.  To afford each patient quality time with our provider, please arrive at least 15 minutes before your scheduled appointment time.   If you have a lab appointment with the [facility] please come in thru the  Main Entrance and check in at the main information desk  You need to re-schedule your appointment should you arrive 10 or more minutes late.  We strive to give you quality time with our providers, and arriving late affects you and other patients whose appointments are after yours.  Also, if you no show three or more times for appointments you may be dismissed from the clinic at the providers discretion.     Again, thank you for choosing [facility].  Our hope is that these requests will decrease the amount of time that you wait before being seen by our physicians.       _____________________________________________________________  Should you have questions after your visit to [facility], please contact our office at [phone] between the hours of 8:30 a.m. and 4:30 p.m.  Voicemails left after 4:30 p.m. will not be returned until the following business day.  For prescription refill requests, have your pharmacy contact our office.       Resources For Cancer Patients and their Caregivers ? American Cancer Society: Can assist with transportation, wigs, general needs, runs Look Good Feel Better.        7137319112 ? Cancer Care: Provides financial assistance,  online support groups, medication/co-pay assistance.  1-800-813-HOPE (954) 587-9941) ? [facility] Assists [place] cancer patients and their families through emotional , educational and financial support.  5877101163 ? [place] DSS Where to apply for food stamps, Medicaid and utility assistance. 365-685-4396 ? [facility]: Transportation to medical appointments. 573-843-5827 ? Social Security Administration: May apply for disability if have a Stage IV cancer. (225)058-2225 507-760-2949 ? [employer], Disability and Transit Services: Assists with nutrition, care and transit needs. [facility] Support Programs:   > Cancer Support Group  2nd Tuesday of the month 1pm-2pm, Journey Room   > Creative Journey  3rd Tuesday of the month 1130am-1pm, Journey Room

## 2017-08-02 ENCOUNTER — Encounter (HOSPITAL_COMMUNITY)
Admission: RE | Admit: 2017-08-02 | Discharge: 2017-08-02 | Disposition: A | Discharge status: Home / Self Care | Payer: BLUE CROSS/BLUE SHIELD | Source: Ambulatory Visit | Attending: General Surgery | Admitting: General Surgery

## 2017-08-02 ENCOUNTER — Encounter (HOSPITAL_COMMUNITY): Payer: Self-pay

## 2017-08-02 ENCOUNTER — Telehealth (HOSPITAL_COMMUNITY): Payer: Self-pay

## 2017-08-02 DIAGNOSIS — C160 Malignant neoplasm of cardia: Secondary | ICD-10-CM

## 2017-08-02 MED ORDER — ONDANSETRON 8 MG PO TBDP: 8.0000 mg | ORAL_TABLET | Freq: Three times a day (TID) | ORAL | 1 refills | Status: DC | PRN

## 2017-08-02 MED ORDER — LIDOCAINE-PRILOCAINE 2.5-2.5 % EX CREA: TOPICAL_CREAM | CUTANEOUS | 3 refills | Status: AC

## 2017-08-02 NOTE — Telephone Encounter (Signed)
-----   Message from Derek Jack, MD sent at 08/01/2017 12:20 PM EDT ----- We need to call in zofran odt 8mg  q8h prn when starting chemo

## 2017-08-02 NOTE — Telephone Encounter (Signed)
Received message from Dr. Raliegh Ip to call patient in Zofran for nausea once chemo has started. Prescription sent to patients pharmacy.

## 2017-08-02 NOTE — Patient Instructions (Addendum)
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 The goal for your treatment is CURE.   You will have the following premedications prior to receiving chemotherapy:  Premeds: Benadryl:  Help prevent an allergic-like reaction to the chemotherapy.  Pepcid: antihistamine - helps prevent an allergic-like reaction to the chemo. Aloxi - high powered nausea/vomiting prevention medication used for chemotherapy patients.  Dexamethasone - steroid - given to reduce the risk of you having an allergic type reaction to the chemotherapy. Dex can cause you to feel energized, nervous/anxious/jittery, make you have trouble sleeping, and/or make you feel hot/flushed in the face/neck and/or look pink/red in the face/neck. These side effects will pass as the Dex wears off. (takes 20 minutes to infuse)   POTENTIAL SIDE EFFECTS OF TREATMENT:  Carboplatin   About This Drug Carboplatin is a drug used to treat cancer. This drug is given in the vein (IV). This drug takes about 30 minutes to infuse.   Possible Side Effects (More Common) . Nausea and throwing up (vomiting). These symptoms may happen within a few hours after your treatment and may last up to 24 hours. Medicines are available to stop or lessen these side effects. . Bone marrow depression. This is a decrease in the number of white blood cells, red blood cells, and platelets. This may raise your risk of infection, make you tired and weak (fatigue), and raise your risk of bleeding. . Soreness of the mouth and throat. You may have red areas, white patches, or sores that hurt. . This drug may affect how your kidneys work. Your kidney function will be checked as needed. . Electrolyte changes. Your blood will be checked for electrolyte changes as needed.  Side Effects (Less Common) . Hair loss. Some patients lose their hair on the scalp and body. You may notice your hair  thinning seven to 14 days after getting this drug. . Effects on the nerves are called peripheral neuropathy. You may feel numbness, tingling, or pain in your hands and feet. It may be hard for you to button your clothes, open jars, or walk as usual. The effect on the nerves may get worse with more doses of the drug. These effects get better in some people after the drug is stopped but it does not get better in all people. . Loose bowel movements (diarrhea) that may last for several days . Decreased hearing or ringing in the ears . Changes in the way food and drinks taste . Changes in liver function. Your liver function will be checked as needed.  Allergic Reactions Serious allergic reactions including anaphylaxis are rare. While you are getting this drug in your vein (IV), tell your nurse right away if you have any of these symptoms of an allergic reaction: . Trouble catching your breath . Feeling like your tongue or throat are swelling . Feeling your heart beat quickly or in a not normal way (palpitations) . Feeling dizzy or lightheaded . Flushing, itching, rash, and/or hives  Treating Side Effects . Drink 6-8 cups of fluids each day unless your doctor has told you to limit your fluid intake due to some other health problem. A cup is 8 ounces of fluid. If you throw up or have loose bowel movements, you should drink more fluids so that you do not become dehydrated (lack water in the body from losing too much fluid). . Mouth care is very important. Your mouth care should consist of routine,  gentle cleaning of your teeth or dentures and rinsing your mouth with a mixture of 1/2 teaspoon of salt in 8 ounces of water or  teaspoon of baking soda in 8 ounces of water. This should be done at least after each meal and at bedtime. . If you have mouth sores, avoid mouthwash that has alcohol. Avoid alcohol and smoking because they can bother your mouth and throat. . If you have numbness and tingling in  your hands and feet, be careful when cooking, walking, and handling sharp objects and hot liquids. . Talk with your nurse about getting a wig before you lose your hair. Also, call the [facility] at 800-ACS-2345 to find out information about the "Look Good, Feel Better" program close to where you live. It is a free program where women getting chemotherapy can learn about wigs, turbans and scarves as well as makeup techniques and skin and nail care.  Food and Drug Interactions There are no known interactions of carboplatin with food. This drug may interact with other medicines. Tell your doctor and pharmacist about all the medicines and dietary supplements (vitamins, minerals, herbs and others) that you are taking at this time. The safety and use of dietary supplements and alternative diets are often not known. Using these might affect your cancer or interfere with your treatment. Until more is known, you should not use dietary supplements or alternative diets without your doctor's help.  When to Call the Doctor Call your doctor or nurse right away if you have any of these symptoms: . Fever of 100.5 F (38 C) or above; chills . Bleeding or bruising that is not normal . Wheezing or trouble breathing . Nausea that stops you from eating or drinking . Throwing up more than once a day . Rash or itching . Loose bowel movements (diarrhea) more than four times a day or diarrhea with weakness or feeling lightheaded . Call your doctor or nurse as soon as possible if any of these symptoms happen: . Numbness, tingling, decreased feeling or weakness in fingers, toes, arms, or legs . Change in hearing, ringing in the ears . Blurred vision or other changes in eyesight . Decreased urine . Yellowing of skin or eyes  Problems and Reproductive Concerns Sexual problems and reproduction concerns may happen. In both men and women, this drug may affect your ability to have children. This cannot be  determined before your treatment. Talk with your doctor or nurse if you plan to have children. Ask for information on sperm or egg banking. In men, this drug may interfere with your ability to make sperm, but it should not change your ability to have sexual relations. In women, menstrual bleeding may become irregular or stop while you are getting this drug. Do not assume that you cannot become pregnant if you do not have a menstrual period. Women may go through signs of menopause (change of life) like vaginal dryness or itching. Vaginal lubricants can be used to lessen vaginal dryness, itching, and pain during sexual relations.   Paclitaxel (Taxol)  Paclitaxel is a drug used to treat cancer. It is given in the vein (IV).  This will take 3 hours to infuse.  The first time this drug is given it will take longer to infuse because it is increased slowly to monitor for reactions.  The nurse will be in the room with you for the first 15 minutes.   Possible Side Effects . Hair loss. Hair loss is often temporary, although  with certain medicine, hair loss can sometimes be permanent. Hair loss may happen suddenly or gradually. If you lose hair, you may lose it from your head, face, armpits, pubic area, chest, and/or legs. You may also notice your hair getting thin. . Swelling of your legs, ankles and/or feet (edema) . Flushing . Nausea and throwing up (vomiting) . Loose bowel movements (diarrhea) . Bone marrow depression. This is a decrease in the number of white blood cells, red blood cells, and platelets. This may raise your risk of infection, make you tired and weak (fatigue), and raise your risk of bleeding. . Effects on the nerves are called peripheral neuropathy. You may feel numbness, tingling, or pain in your hands and feet. It may be hard for you to button your clothes, open jars, or walk as usual. The effect on the nerves may get worse with more doses of the drug. These effects get better in some  people after the drug is stopped but it does not get better in all people. . Changes in your liver function . Bone, joint and muscle pain . Abnormal EKG . Allergic reaction: Allergic reactions, including anaphylaxis are rare but may happen in some patients. Signs of allergic reaction to this drug may be swelling of the face, feeling like your tongue or throat are swelling, trouble breathing, rash, itching, fever, chills, feeling dizzy, and/or feeling that your heart is beating in a fast or not normal way. If this happens, do not take another dose of this drug. You should get urgent medical treatment. . Infection . Changes in your kidney function. Note: Each of the side effects above was reported in 20% or greater of patients treated with paclitaxel. Not all possible side effects are included above.  Warnings and Precautions . Severe bone marrow depression   Side Effects . To help with hair loss, wash with a mild shampoo and avoid washing your hair every day. . Avoid rubbing your scalp, instead, pat your hair or scalp dry . Avoid coloring your hair . Limit your use of hair spray, electric curlers, blow dryers, and curling irons. . To help with nausea and vomiting, eat small, frequent meals instead of three large meals a day. Choose foods and drinks that are at room temperature. Ask your nurse or doctor about other helpful tips and medicine that is available to help or stop lessen these symptoms. . If you get diarrhea, eat low-fiber foods that are high in protein and calories and avoid foods that can irritate your digestive tracts or lead to cramping. Ask your nurse or doctor about medicine that can lessen or stop your diarrhea. . Mouth care is very important. Your mouth care should consist of routine, gentle cleaning of your teeth or dentures and rinsing your mouth with a mixture of 1/2 teaspoon of salt in 8 ounces of water or  teaspoon of baking soda in 8 ounces of water. This should be done  at least after each meal and at bedtime. . If you have mouth sores, avoid mouthwash that has alcohol. Also avoid alcohol and smoking because they can bother your mouth and throat. . Drink plenty of fluids (a minimum of eight glasses per day is recommended). . Take your temperature as your doctor or nurse tells you, and whenever you feel like you may have a fever. . Talk to your doctor or nurse about precautions you can take to avoid infections and bleeding. . Be careful when cooking, walking, and handling sharp objects and  hot liquids.  Food and Drug Interactions . There are no known interactions of paclitaxel with food. . This drug may interact with other medicines. Tell your doctor and pharmacist about all the medicines and dietary supplements (vitamins, minerals, herbs and others) that you are taking at this time. . The safety and use of dietary supplements and alternative diets are often not known. Using these might affect your cancer or interfere with your treatment. Until more is known, you should not use dietary supplements or alternative diets without your cancer doctor's help.  When to Call the Doctor Call your doctor or nurse if you have any of the following symptoms and/or any new or unusual symptoms: . Fever of 100.5 F (38 C) or above . Chills . Redness, pain, warmth, or swelling at the IV site during the infusion . Signs of allergic reaction: swelling of the face, feeling like your tongue or throat are swelling, trouble breathing, rash, itching, fever, chills, feeling dizzy, and/or feeling that your heart is beating in a fast or not normal way . Feeling that your heart is beating in a fast or not normal way (palpitations) . Weight gain of 5 pounds in one week (fluid retention) . Decreased urine or very dark urine . Signs of liver problems: dark urine, pale bowel movements, bad stomach pain, feeling very tired and weak, unusual itching, or yellowing of the eyes or skin . Heavy  menstrual period that lasts longer than normal . Easy bruising or bleeding . Nausea that stops you from eating or drinking, and/or that is not relieved by prescribed medicines. . Loose bowel movements (diarrhea) more than 4 times a day or diarrhea with weakness or lightheadedness . Pain in your mouth or throat that makes it hard to eat or drink . Lasting loss of appetite or rapid weight loss of five pounds in a week . Signs of peripheral neuropathy: numbness, tingling, or decreased feeling in fingers or toes; trouble walking or changes in the way you walk; or feeling clumsy when buttoning clothes, opening jars, or other routine activities . Joint and muscle pain that is not relieved by prescribed medicines . Extreme fatigue that interferes with normal activities . While you are getting this drug, please tell your nurse right away if you have any pain, redness, or swelling at the site of the IV infusion. . If you think you are pregnant.    SELF CARE ACTIVITIES WHILE ON CHEMOTHERAPY:  Hydration Increase your fluid intake 48 hours prior to treatment and drink at least 8 to 12 cups (64 ounces) of water/decaff beverages per day after treatment. You can still have your cup of coffee or soda but these beverages do not count as part of your 8 to 12 cups that you need to drink daily. No alcohol intake.  Medications Continue taking your normal prescription medication as prescribed.  If you start any new herbal or new supplements please let us know first to make sure it is safe.  Mouth Care Have teeth cleaned professionally before starting treatment. Keep dentures and partial plates clean. Use soft toothbrush and do not use mouthwashes that contain alcohol. Biotene is a good mouthwash that is available at most pharmacies or may be ordered by calling [phone]. Use warm salt water gargles (1 teaspoon salt per 1 quart warm water) before and after meals and at bedtime. Or you may rinse with 2  tablespoons of three-percent hydrogen peroxide mixed in eight ounces of water. If you are still having  problems with your mouth or sores in your mouth please call the clinic. If you need dental work, please let the doctor know before you go for your appointment so that we can coordinate the best possible time for you in regards to your chemo regimen. You need to also let your dentist know that you are actively taking chemo. We may need to do labs prior to your dental appointment.  Skin Care Always use sunscreen that has not expired and with SPF (Sun Protection Factor) of 50 or higher. Wear hats to protect your head from the sun. Remember to use sunscreen on your hands, ears, face, & feet.  Use good moisturizing lotions such as udder cream, eucerin, or even Vaseline. Some chemotherapies can cause dry skin, color changes in your skin and nails.     Avoid Mack, hot showers or baths.  Use gentle, fragrance-free soaps and laundry detergent.  Use moisturizers, preferably creams or ointments rather than lotions because the thicker consistency is better at preventing skin dehydration. Apply the cream or ointment within 15 minutes of showering. Reapply moisturizer at night, and moisturize your hands every time after you wash them.  Hair Loss (if your doctor says your hair will fall out)   If your doctor says that your hair is likely to fall out, decide before you begin chemo whether you want to wear a wig. You may want to shop before treatment to match your hair color.  Hats, turbans, and scarves can also camouflage hair loss, although some people prefer to leave their heads uncovered. If you go bare-headed outdoors, be sure to use sunscreen on your scalp.  Cut your hair short. It eases the inconvenience of shedding lots of hair, but it also can reduce the emotional impact of watching your hair fall out.  Don't perm or color your hair during chemotherapy. Those chemical treatments are already  damaging to hair and can enhance hair loss. Once your chemo treatments are done and your hair has grown back, it's OK to resume dyeing or perming hair. With chemotherapy, hair loss is almost always temporary. But when it grows back, it may be a different color or texture. In older adults who still had hair color before chemotherapy, the new growth may be completely gray.  Often, new hair is very fine and soft.  Infection Prevention Please wash your hands for at least 30 seconds using warm soapy water. Handwashing is the #1 way to prevent the spread of germs. Stay away from sick people or people who are getting over a cold. If you develop respiratory systems such as green/yellow mucus production or productive cough or persistent cough let us know and we will see if you need an antibiotic. It is a good idea to keep a pair of gloves on when going into grocery stores/[employer] to decrease your risk of coming into contact with germs on the carts, etc. Carry alcohol hand gel with you at all times and use it frequently if out in public. If your temperature reaches 100.5 or higher please call the clinic and let us know.  If it is after hours or on the weekend please go to the ER if your temperature is over 100.5.  Please have your own personal thermometer at home to use.    Sex and bodily fluids If you are going to have sex, a condom must be used to protect the person that isn't taking chemotherapy. Chemo can decrease your libido (sex drive). For a few days  after chemotherapy, chemotherapy can be excreted through your bodily fluids.  When using the toilet please close the lid and flush the toilet twice.  Do this for a few day after you have had chemotherapy.   Effects of chemotherapy on your sex life Some changes are simple and won't last [place]. They won't affect your sex life permanently. Sometimes you may feel:  too tired  not strong enough to be very active  sick or sore   not in the mood  anxious or  low Your anxiety might not seem related to sex. For example, you may be worried about the cancer and how your treatment is going. Or you may be worried about money, or about how you family are coping with your illness. These things can cause stress, which can affect your interest in sex. It's important to talk to your partner about how you feel. Remember - the changes to your sex life don't usually last [place]. There's usually no medical reason to stop having sex during chemo. The drugs won't have any [place] term physical effects on your performance or enjoyment of sex. Cancer can't be passed on to your partner during sex  Contraception It's important to use reliable contraception during treatment. Avoid getting pregnant while you or your partner are having chemotherapy. This isbecause the drugs may harm the baby. Sometimes chemotherapy drugs can leave a man or woman infertile.  This means you would not be able to have children in the future. You might want to talk to someone about permanent infertility. It can be very difficult to learn that you may no longer be able to have children. Some people find counselling helpful. There might be ways to preserve your fertility, although this is easier for men than for women. You may want to speak to a fertility expert. You can talk about sperm banking or harvesting your eggs. You can also ask about other fertility options, such as donor eggs. If you haveor have had breast cancer, your doctor might advise you not to take the contraceptive pill. This is because the hormones in it might affect the cancer. It is not known for sure whether or not chemotherapy drugs can be passed on through semen or secretions from the vagina. Because of this some doctors advise people to use a barrier method if you have sex during treatment. This applies to vaginal, anal or oral sex. Generally, doctors advise a barrier method only for the time you are actually having the treatment and  for about a week after your treatment. Advice like this can be worrying, but this does not mean that you have to avoid being intimate with your partner. You can still have close contact with your partner and continue to enjoy sex.     Animals If you have cats or birds we just ask that you not change the litter or change the cage.  Please have someone else do this for you while you are on chemotherapy.   Food Safety During and After Cancer Treatment Food safety is important for people both during and after cancer treatment. Cancer and cancer treatments, such as chemotherapy, radiation therapy, and stem cell/bone marrow transplantation, often weaken the immune system. This makes it harder for your body to protect itself from foodborne illness, also called food poisoning. Foodborne illness is caused by eating food that contains harmful bacteria, parasites, or viruses.  Foods to avoid Some foods have a higher risk of becoming tainted with bacteria. These include:  Unwashed  fresh fruit and vegetables, especially leafy vegetables that can hide dirt and other contaminants  Raw sprouts, such as alfalfa sprouts  Raw or undercooked beef, especially ground beef, or other raw or undercooked meat and poultry  Fatty, fried, or spicy foods immediately before or after treatment.  These can sit heavy on your stomach and make you feel nauseous.  Raw or undercooked shellfish, such as oysters.  Sushi and sashimi, which often contain raw fish.   Unpasteurized beverages, such as unpasteurized fruit juices, raw milk, raw yogurt, or cider  Undercooked eggs, such as soft boiled, over easy, and poached; raw, unpasteurized eggs; or foods made with raw egg, such as homemade raw cookie dough and homemade mayonnaise   Simple steps for food safety Shop smart.  Do not buy food stored or displayed in an unclean area.  Do not buy bruised or damaged fruits or vegetables.  Do not buy cans that have cracks,  dents, or bulges.  Pick up foods that can spoil at the end of your shopping trip and store them in a cooler on the way home. Prepare and clean up foods carefully.  Rinse all fresh fruits and vegetables under running water, and dry them with a clean towel or paper towel.  Clean the top of cans before opening them.  After preparing food, wash your hands for 20 seconds with hot water and soap. Pay special attention to areas between fingers and under nails.  Clean your utensils and dishes with hot water and soap.  Disinfect your kitchen and cutting boards using 1 teaspoon of liquid, unscented bleach mixed into 1 quart of water.  Dispose of old food.  Eat canned and packaged food before its expiration date (the "use by" or "best before" date).  Consume refrigerated leftovers within 3 to 4 days. After that time, throw out the food. Even if the food does not smell or look spoiled, it still may be unsafe. Some bacteria, such as Listeria, can grow even on foods stored in the refrigerator if they are kept for too [place]. Take precautions when eating out.  At restaurants, avoid buffets and salad bars where food sits out for a [place] time and comes in contact with many people. Food can become contaminated when someone with a virus, often a norovirus, or another "bug" handles it.  Put any leftover food in a "to-go" container yourself, rather than having the server do it. And, refrigerate leftovers as soon as you get home.  Choose restaurants that are clean and that are willing to prepare your food as you order it cooked.    MEDICATIONS:                                           Compazine/Prochlorperazine 10mg  tablet. Take 1 tablet every 6 hours as needed for nausea/vomiting. (#2 nausea med to take, this can make you sleepy)  EMLA cream. Apply a quarter size amount to port site 1 hour prior to chemo. Do not rub in. Cover with plastic wrap.   Over-the-Counter Meds:  Colace 100mg  capsule.  Take 2 capsules once a day for constipation. You may increase this dose to 2 tablets in the morning and 2 tablets at night for a total of 4 capsules a day. If this does not relieve your constipation, please call [facility] so we can call a medication in to your  pharmacy to help your bowels move.    Imodium 2mg  capsule. Take 2 capsules after the 1st loose stool and then 1 capsule after every loose stool but do not exceed 8 capsules in a 24 hour period. Call the [facility] if loose stools continue. If diarrhea occurs @ bedtime, take 2 capsules @ bedtime. Then take 2 capsules every 4 hours until morning. Call [facility].    Diarrhea Sheet  If you are having loose stools/diarrhea, please purchase Imodium and begin taking as outlined:  At the first sign of loose stools you should begin taking Imodium. Take two capsules (4mg ) after 1st loose stool. Then take 1 capsule after each loose stool but do not exceed 8 capsules in a 24 hour period. During the night, take two capsules (4mg ) at bedtime and continue every 4 hours during the night until the morning. Call the [facility]. If it is the weekend, go to the ER.  Always call the [facility] if you are having loose stools/diarrhea that you can't get under control.  Loose stools/diarrhea leads to dehydration (loss of water) in your body.  We have other options of trying to get the loose stools/diarrhea to stopped but you must let us know!   Constipation Sheet  Do not go more than 2 days without a bowel movement.  It is very important that you do not become constipated.  It will make you feel sick to your stomach (nausea) and can cause abdominal pain and vomiting.   Nausea Sheet   Compazine/Prochlorperazine 10mg  tablet. Take 1 tablet every 6 hours as needed for nausea/vomiting. (can make you sleepy)  Please call the [facility] and let us know the amount of nausea that you are experiencing.  If you begin to vomit, you  need to call the [facility] and if it is the weekend and you have vomited more than one time and can't get it to stop - go to the Emergency Room.  Persistent nausea/vomiting can lead to dehydration (loss of fluid in your body) and will make you feel terrible.   Ice chips, sips of clear liquids, foods that are @ room temperature, crackers, and toast tend to be better tolerated.     SYMPTOMS TO REPORT AS SOON AS POSSIBLE AFTER TREATMENT:  FEVER GREATER THAN 100.5 F  CHILLS WITH OR WITHOUT FEVER  NAUSEA AND VOMITING THAT IS NOT CONTROLLED WITH YOUR NAUSEA MEDICATION  UNUSUAL SHORTNESS OF BREATH  UNUSUAL BRUISING OR BLEEDING  TENDERNESS IN MOUTH AND THROAT WITH OR WITHOUT PRESENCE OF ULCERS  URINARY PROBLEMS  BOWEL PROBLEMS  UNUSUAL RASH      Wear comfortable clothing and clothing appropriate for easy access to any Portacath or PICC line. Let us know if there is anything that we can do to make your therapy better!    What to do if you need assistance after hours or on the weekends: CALL [phone].  HOLD on the line, do not hang up.  You will hear multiple messages but at the end you will be connected with a nurse triage line.  They will contact the doctor if necessary.  Most of the time they will be able to assist you.  Do not call the hospital operator.    I have been informed and understand all of the instructions given to me and have received a copy. I have been instructed to call the clinic [phone] or my family physician as soon as possible for continued medical care, if  indicated. I do not have any more questions at this time but understand that I may call the [facility] or the Patient Navigator at [phone] during office hours should I have questions or need assistance in obtaining follow-up care.  [facility] Discharge Instructions for Patients Receiving Chemotherapy     Today you received the following chemotherapy  agents Taxol and Carboplatin. Follow-up as scheduled. Call clinic for any questions or concerns  To help prevent nausea and vomiting after your treatment, we encourage you to take your nausea medication   If you develop nausea and vomiting, or diarrhea that is not controlled by your medication, call the clinic.  The clinic phone number is [phone]. Office hours are Monday-Friday 8:30am-5:00pm.  BELOW ARE SYMPTOMS THAT SHOULD BE REPORTED IMMEDIATELY:  *FEVER GREATER THAN 101.0 F  *CHILLS WITH OR WITHOUT FEVER  NAUSEA AND VOMITING THAT IS NOT CONTROLLED WITH YOUR NAUSEA MEDICATION  *UNUSUAL SHORTNESS OF BREATH  *UNUSUAL BRUISING OR BLEEDING  TENDERNESS IN MOUTH AND THROAT WITH OR WITHOUT PRESENCE OF ULCERS  *URINARY PROBLEMS  *BOWEL PROBLEMS  UNUSUAL RASH Items with * indicate a potential emergency and should be followed up as soon as possible. If you have an emergency after office hours please contact your primary care physician or go to the nearest emergency department.  Please call the clinic during office hours if you have any questions or concerns.   You may also contact the Patient Navigator at [phone] should you have any questions or need assistance in obtaining follow up care.      Resources For Cancer Patients and their Caregivers ? American Cancer Society: Can assist with transportation, wigs, general needs, runs Look Good Feel Better.        636-399-9632 ? Cancer Care: Provides financial assistance, online support groups, medication/co-pay assistance.  1-800-813-HOPE 818-866-6878) ? [facility] Assists [place] cancer patients and their families through emotional , educational and financial support.  2084650250 ? [place] DSS Where to apply for food stamps, Medicaid and utility assistance. 616-240-4906 ? [facility]: Transportation to medical appointments. 570-595-0886 ? Social Security Administration: May apply for  disability if have a Stage IV cancer. (332)545-5309 (715) [phone] ? [employer], Disability and Transit Services: Assists with nutrition, care and transit needs. 581-511-8487

## 2017-08-03 ENCOUNTER — Other Ambulatory Visit: Payer: Self-pay

## 2017-08-03 ENCOUNTER — Encounter (HOSPITAL_COMMUNITY): Payer: Self-pay | Admitting: *Deleted

## 2017-08-03 ENCOUNTER — Ambulatory Visit (HOSPITAL_COMMUNITY): Payer: BLUE CROSS/BLUE SHIELD | Admitting: Anesthesiology

## 2017-08-03 ENCOUNTER — Inpatient Hospital Stay (HOSPITAL_COMMUNITY): Payer: BLUE CROSS/BLUE SHIELD

## 2017-08-03 ENCOUNTER — Encounter (HOSPITAL_COMMUNITY)
Admission: AD | Disposition: A | Discharge status: Home Health | Payer: Self-pay | Source: Home / Self Care | Attending: General Surgery

## 2017-08-03 ENCOUNTER — Inpatient Hospital Stay (HOSPITAL_COMMUNITY)
Admission: AD | Admit: 2017-08-03 | Discharge: 2017-08-11 | DRG: 981 | Disposition: A | Discharge status: Home Health | Payer: BLUE CROSS/BLUE SHIELD | Attending: General Surgery | Admitting: General Surgery

## 2017-08-03 ENCOUNTER — Ambulatory Visit (HOSPITAL_COMMUNITY): Payer: BLUE CROSS/BLUE SHIELD

## 2017-08-03 DIAGNOSIS — J439 Emphysema, unspecified: Secondary | ICD-10-CM | POA: Diagnosis present

## 2017-08-03 DIAGNOSIS — B9681 Helicobacter pylori [H. pylori] as the cause of diseases classified elsewhere: Secondary | ICD-10-CM | POA: Diagnosis present

## 2017-08-03 DIAGNOSIS — Z95828 Presence of other vascular implants and grafts: Secondary | ICD-10-CM

## 2017-08-03 DIAGNOSIS — E785 Hyperlipidemia, unspecified: Secondary | ICD-10-CM | POA: Diagnosis present

## 2017-08-03 DIAGNOSIS — Z9109 Other allergy status, other than to drugs and biological substances: Secondary | ICD-10-CM

## 2017-08-03 DIAGNOSIS — Z8249 Family history of ischemic heart disease and other diseases of the circulatory system: Secondary | ICD-10-CM

## 2017-08-03 DIAGNOSIS — K222 Esophageal obstruction: Secondary | ICD-10-CM | POA: Diagnosis present

## 2017-08-03 DIAGNOSIS — K567 Ileus, unspecified: Secondary | ICD-10-CM | POA: Diagnosis not present

## 2017-08-03 DIAGNOSIS — Z6826 Body mass index (BMI) 26.0-26.9, adult: Secondary | ICD-10-CM | POA: Diagnosis not present

## 2017-08-03 DIAGNOSIS — I10 Essential (primary) hypertension: Secondary | ICD-10-CM | POA: Diagnosis present

## 2017-08-03 DIAGNOSIS — G4733 Obstructive sleep apnea (adult) (pediatric): Secondary | ICD-10-CM | POA: Diagnosis present

## 2017-08-03 DIAGNOSIS — E43 Unspecified severe protein-calorie malnutrition: Secondary | ICD-10-CM | POA: Diagnosis present

## 2017-08-03 DIAGNOSIS — Z79899 Other long term (current) drug therapy: Secondary | ICD-10-CM | POA: Diagnosis not present

## 2017-08-03 DIAGNOSIS — I4949 Other premature depolarization: Secondary | ICD-10-CM | POA: Diagnosis not present

## 2017-08-03 DIAGNOSIS — C160 Malignant neoplasm of cardia: Secondary | ICD-10-CM | POA: Diagnosis present

## 2017-08-03 DIAGNOSIS — K2970 Gastritis, unspecified, without bleeding: Secondary | ICD-10-CM | POA: Diagnosis present

## 2017-08-03 DIAGNOSIS — I700 Atherosclerosis of aorta: Secondary | ICD-10-CM | POA: Diagnosis present

## 2017-08-03 DIAGNOSIS — I493 Ventricular premature depolarization: Secondary | ICD-10-CM | POA: Diagnosis present

## 2017-08-03 DIAGNOSIS — Z978 Presence of other specified devices: Secondary | ICD-10-CM

## 2017-08-03 DIAGNOSIS — K2980 Duodenitis without bleeding: Secondary | ICD-10-CM | POA: Diagnosis present

## 2017-08-03 DIAGNOSIS — F17210 Nicotine dependence, cigarettes, uncomplicated: Secondary | ICD-10-CM | POA: Diagnosis present

## 2017-08-03 DIAGNOSIS — K209 Esophagitis, unspecified: Secondary | ICD-10-CM | POA: Diagnosis present

## 2017-08-03 DIAGNOSIS — C169 Malignant neoplasm of stomach, unspecified: Secondary | ICD-10-CM | POA: Diagnosis present

## 2017-08-03 DIAGNOSIS — I719 Aortic aneurysm of unspecified site, without rupture: Secondary | ICD-10-CM | POA: Diagnosis present

## 2017-08-03 HISTORY — PX: PORTACATH PLACEMENT: SHX2246

## 2017-08-03 HISTORY — PX: JEJUNOSTOMY: SHX313

## 2017-08-03 SURGERY — INSERTION, TUNNELED CENTRAL VENOUS DEVICE, WITH PORT
Anesthesia: General

## 2017-08-03 MED ORDER — HEPARIN SOD (PORK) LOCK FLUSH 100 UNIT/ML IV SOLN
INTRAVENOUS | Status: DC | PRN
  Administered 2017-08-03: 500 [IU] via INTRAVENOUS

## 2017-08-03 MED ORDER — HYDROCODONE-ACETAMINOPHEN 7.5-325 MG PO TABS: 1.0000 | ORAL_TABLET | Freq: Once | ORAL | Status: DC | PRN

## 2017-08-03 MED ORDER — MIDAZOLAM HCL 5 MG/5ML IJ SOLN
INTRAMUSCULAR | Status: DC | PRN
  Administered 2017-08-03 (×2): 1 mg via INTRAVENOUS

## 2017-08-03 MED ORDER — ONDANSETRON HCL 4 MG/2ML IJ SOLN
4.0000 mg | Freq: Four times a day (QID) | INTRAMUSCULAR | Status: DC | PRN
  Administered 2017-08-04 – 2017-08-05 (×3): 4 mg via INTRAVENOUS
  Filled 2017-08-03 (×3): qty 2

## 2017-08-03 MED ORDER — STERILE WATER FOR IRRIGATION IR SOLN
Status: DC | PRN
  Administered 2017-08-03: 7 mL

## 2017-08-03 MED ORDER — GLYCOPYRROLATE 0.2 MG/ML IJ SOLN
INTRAMUSCULAR | Status: DC | PRN
  Administered 2017-08-03: .5 mg via INTRAVENOUS

## 2017-08-03 MED ORDER — LACTATED RINGERS IV SOLN: INTRAVENOUS | Status: DC

## 2017-08-03 MED ORDER — ATORVASTATIN CALCIUM 40 MG PO TABS
40.0000 mg | ORAL_TABLET | Freq: Every evening | ORAL | Status: DC
  Administered 2017-08-04: 40 mg via ORAL
  Filled 2017-08-03 (×2): qty 1

## 2017-08-03 MED ORDER — OXYCODONE HCL 5 MG/5ML PO SOLN
5.0000 mg | ORAL | Status: DC | PRN
  Administered 2017-08-04: 5 mg via ORAL
  Filled 2017-08-03 (×2): qty 5

## 2017-08-03 MED ORDER — BUPIVACAINE LIPOSOME 1.3 % IJ SUSP
INTRAMUSCULAR | Status: AC
  Filled 2017-08-03: qty 20

## 2017-08-03 MED ORDER — ONDANSETRON 4 MG PO TBDP: 4.0000 mg | ORAL_TABLET | Freq: Four times a day (QID) | ORAL | Status: DC | PRN

## 2017-08-03 MED ORDER — FENTANYL CITRATE (PF) 100 MCG/2ML IJ SOLN
INTRAMUSCULAR | Status: AC
  Filled 2017-08-03: qty 2

## 2017-08-03 MED ORDER — MIDAZOLAM HCL 2 MG/2ML IJ SOLN
INTRAMUSCULAR | Status: AC
  Filled 2017-08-03: qty 2

## 2017-08-03 MED ORDER — LIDOCAINE HCL (CARDIAC) PF 50 MG/5ML IV SOSY
PREFILLED_SYRINGE | INTRAVENOUS | Status: DC | PRN
  Administered 2017-08-03: 40 mg via INTRAVENOUS

## 2017-08-03 MED ORDER — GLYCOPYRROLATE 0.2 MG/ML IJ SOLN
INTRAMUSCULAR | Status: AC
  Filled 2017-08-03: qty 3

## 2017-08-03 MED ORDER — CHLORHEXIDINE GLUCONATE CLOTH 2 % EX PADS: 6.0000 | MEDICATED_PAD | Freq: Once | CUTANEOUS | Status: DC

## 2017-08-03 MED ORDER — LACTATED RINGERS IV SOLN
INTRAVENOUS | Status: DC
  Administered 2017-08-03 – 2017-08-04 (×2): via INTRAVENOUS

## 2017-08-03 MED ORDER — CEFAZOLIN SODIUM-DEXTROSE 2-4 GM/100ML-% IV SOLN
2.0000 g | Freq: Three times a day (TID) | INTRAVENOUS | Status: AC
  Administered 2017-08-03 – 2017-08-04 (×2): 2 g via INTRAVENOUS
  Filled 2017-08-03 (×2): qty 100

## 2017-08-03 MED ORDER — ONDANSETRON HCL 4 MG/2ML IJ SOLN
INTRAMUSCULAR | Status: AC
  Filled 2017-08-03: qty 2

## 2017-08-03 MED ORDER — MORPHINE SULFATE (PF) 2 MG/ML IV SOLN
2.0000 mg | INTRAVENOUS | Status: DC | PRN
  Administered 2017-08-03 – 2017-08-04 (×6): 2 mg via INTRAVENOUS
  Filled 2017-08-03 (×6): qty 1

## 2017-08-03 MED ORDER — SUCCINYLCHOLINE CHLORIDE 20 MG/ML IJ SOLN
INTRAMUSCULAR | Status: AC
  Filled 2017-08-03: qty 1

## 2017-08-03 MED ORDER — DIPHENHYDRAMINE HCL 50 MG/ML IJ SOLN: 12.5000 mg | Freq: Four times a day (QID) | INTRAMUSCULAR | Status: DC | PRN

## 2017-08-03 MED ORDER — HEPARIN SOD (PORK) LOCK FLUSH 100 UNIT/ML IV SOLN
INTRAVENOUS | Status: AC
  Filled 2017-08-03: qty 5

## 2017-08-03 MED ORDER — 0.9 % SODIUM CHLORIDE (POUR BTL) OPTIME
TOPICAL | Status: DC | PRN
  Administered 2017-08-03: 1000 mL

## 2017-08-03 MED ORDER — ONDANSETRON HCL 4 MG/2ML IJ SOLN
INTRAMUSCULAR | Status: DC | PRN
  Administered 2017-08-03: 4 mg via INTRAVENOUS

## 2017-08-03 MED ORDER — SODIUM CHLORIDE 0.9 % IJ SOLN
INTRAMUSCULAR | Status: DC | PRN
  Administered 2017-08-03: 500 mL via INTRAVENOUS

## 2017-08-03 MED ORDER — LACTATED RINGERS IV SOLN
INTRAVENOUS | Status: DC
  Administered 2017-08-03 (×2): via INTRAVENOUS

## 2017-08-03 MED ORDER — ROCURONIUM 10MG/ML (10ML) SYRINGE FOR MEDFUSION PUMP - OPTIME
INTRAVENOUS | Status: DC | PRN
  Administered 2017-08-03: 10 mg via INTRAVENOUS
  Administered 2017-08-03: 26 mg via INTRAVENOUS
  Administered 2017-08-03: 4 mg via INTRAVENOUS
  Administered 2017-08-03: 20 mg via INTRAVENOUS

## 2017-08-03 MED ORDER — ROCURONIUM BROMIDE 50 MG/5ML IV SOLN
INTRAVENOUS | Status: AC
  Filled 2017-08-03: qty 1

## 2017-08-03 MED ORDER — PROPOFOL 10 MG/ML IV BOLUS
INTRAVENOUS | Status: DC | PRN
  Administered 2017-08-03: 150 mg via INTRAVENOUS

## 2017-08-03 MED ORDER — BUPIVACAINE LIPOSOME 1.3 % IJ SUSP
INTRAMUSCULAR | Status: DC | PRN
  Administered 2017-08-03: 20 mL

## 2017-08-03 MED ORDER — DIPHENHYDRAMINE HCL 12.5 MG/5ML PO ELIX: 12.5000 mg | ORAL_SOLUTION | Freq: Four times a day (QID) | ORAL | Status: DC | PRN

## 2017-08-03 MED ORDER — MEPERIDINE HCL 50 MG/ML IJ SOLN: 6.2500 mg | INTRAMUSCULAR | Status: DC | PRN

## 2017-08-03 MED ORDER — OSMOLITE 1.5 CAL PO LIQD
1000.0000 mL | ORAL | Status: DC
  Administered 2017-08-03: 1000 mL
  Filled 2017-08-03 (×2): qty 1000

## 2017-08-03 MED ORDER — FENTANYL CITRATE (PF) 100 MCG/2ML IJ SOLN
INTRAMUSCULAR | Status: DC | PRN
  Administered 2017-08-03 (×4): 50 ug via INTRAVENOUS

## 2017-08-03 MED ORDER — LIDOCAINE HCL (PF) 1 % IJ SOLN
INTRAMUSCULAR | Status: AC
  Filled 2017-08-03: qty 5

## 2017-08-03 MED ORDER — ZOLPIDEM TARTRATE 5 MG PO TABS: 5.0000 mg | ORAL_TABLET | Freq: Every evening | ORAL | Status: DC | PRN

## 2017-08-03 MED ORDER — HYDROMORPHONE HCL 1 MG/ML IJ SOLN
0.2500 mg | INTRAMUSCULAR | Status: DC | PRN
  Administered 2017-08-03 (×4): 0.5 mg via INTRAVENOUS
  Filled 2017-08-03 (×4): qty 0.5

## 2017-08-03 MED ORDER — SIMETHICONE 80 MG PO CHEW
40.0000 mg | CHEWABLE_TABLET | Freq: Four times a day (QID) | ORAL | Status: DC | PRN
  Administered 2017-08-05 – 2017-08-06 (×2): 40 mg via ORAL
  Filled 2017-08-03 (×2): qty 1

## 2017-08-03 MED ORDER — NEOSTIGMINE METHYLSULFATE 10 MG/10ML IV SOLN
INTRAVENOUS | Status: DC | PRN
  Administered 2017-08-03: 3 mg via INTRAVENOUS

## 2017-08-03 MED ORDER — DOCUSATE SODIUM 50 MG/5ML PO LIQD
50.0000 mg | Freq: Two times a day (BID) | ORAL | Status: DC
  Administered 2017-08-04 – 2017-08-06 (×5): 50 mg via ORAL
  Filled 2017-08-03 (×9): qty 10

## 2017-08-03 MED ORDER — LIDOCAINE HCL (PF) 1 % IJ SOLN
INTRAMUSCULAR | Status: AC
  Filled 2017-08-03: qty 30

## 2017-08-03 MED ORDER — HEPARIN SODIUM (PORCINE) 5000 UNIT/ML IJ SOLN
5000.0000 [IU] | Freq: Three times a day (TID) | INTRAMUSCULAR | Status: DC
  Administered 2017-08-03 – 2017-08-11 (×24): 5000 [IU] via SUBCUTANEOUS
  Filled 2017-08-03 (×25): qty 1

## 2017-08-03 MED ORDER — CEFAZOLIN SODIUM-DEXTROSE 2-4 GM/100ML-% IV SOLN
2.0000 g | INTRAVENOUS | Status: AC
  Administered 2017-08-03: 2 g via INTRAVENOUS
  Filled 2017-08-03: qty 100

## 2017-08-03 MED ORDER — LOSARTAN POTASSIUM 50 MG PO TABS
100.0000 mg | ORAL_TABLET | Freq: Every day | ORAL | Status: DC
  Administered 2017-08-04 – 2017-08-06 (×2): 100 mg via ORAL
  Filled 2017-08-03 (×3): qty 2

## 2017-08-03 MED ORDER — METOPROLOL TARTRATE 5 MG/5ML IV SOLN: 5.0000 mg | Freq: Four times a day (QID) | INTRAVENOUS | Status: DC | PRN

## 2017-08-03 MED ORDER — PROMETHAZINE HCL 25 MG/ML IJ SOLN: 6.2500 mg | INTRAMUSCULAR | Status: DC | PRN

## 2017-08-03 MED ORDER — VERAPAMIL HCL ER 200 MG PO CP24
200.0000 mg | ORAL_CAPSULE | Freq: Every day | ORAL | Status: DC
  Administered 2017-08-04 – 2017-08-06 (×3): 200 mg via ORAL

## 2017-08-03 SURGICAL SUPPLY — 46 items
BAG DECANTER FOR FLEXI CONT (MISCELLANEOUS) ×3 IMPLANT
CELLS DAT CNTRL 66122 CELL SVR (MISCELLANEOUS) ×1 IMPLANT
CHLORAPREP W/TINT 10.5 ML (MISCELLANEOUS) ×3 IMPLANT
CHLORAPREP W/TINT 26ML (MISCELLANEOUS) ×3 IMPLANT
CLOTH BEACON ORANGE TIMEOUT ST (SAFETY) ×3 IMPLANT
COVER LIGHT HANDLE STERIS (MISCELLANEOUS) ×12 IMPLANT
DERMABOND ADVANCED (GAUZE/BANDAGES/DRESSINGS) ×2
DERMABOND ADVANCED .7 DNX12 (GAUZE/BANDAGES/DRESSINGS) ×1 IMPLANT
DRAPE C-ARM FOLDED MOBILE STRL (DRAPES) ×3 IMPLANT
DRSG OPSITE POSTOP 4X8 (GAUZE/BANDAGES/DRESSINGS) ×3 IMPLANT
ELECT REM PT RETURN 9FT ADLT (ELECTROSURGICAL) ×3
ELECTRODE REM PT RTRN 9FT ADLT (ELECTROSURGICAL) ×1 IMPLANT
GLOVE BIO SURGEON STRL SZ 6.5 (GLOVE) ×4 IMPLANT
GLOVE BIO SURGEONS STRL SZ 6.5 (GLOVE) ×2
GLOVE BIOGEL PI IND STRL 6.5 (GLOVE) ×2 IMPLANT
GLOVE BIOGEL PI IND STRL 7.0 (GLOVE) ×4 IMPLANT
GLOVE BIOGEL PI INDICATOR 6.5 (GLOVE) ×4
GLOVE BIOGEL PI INDICATOR 7.0 (GLOVE) ×8
GLOVE SURG SS PI 7.5 STRL IVOR (GLOVE) ×3 IMPLANT
GOWN STRL REUS W/TWL LRG LVL3 (GOWN DISPOSABLE) ×15 IMPLANT
INST SET MAJOR GENERAL (KITS) ×3 IMPLANT
IV NS 500ML (IV SOLUTION) ×2
IV NS 500ML BAXH (IV SOLUTION) ×1 IMPLANT
KIT PORT POWER 8FR ISP MRI (Port) ×3 IMPLANT
KIT TURNOVER KIT A (KITS) ×3 IMPLANT
MANIFOLD NEPTUNE II (INSTRUMENTS) ×3 IMPLANT
NEEDLE HYPO 18GX1.5 BLUNT FILL (NEEDLE) ×3 IMPLANT
NEEDLE HYPO 21X1.5 SAFETY (NEEDLE) ×3 IMPLANT
NEEDLE HYPO 25X1 1.5 SAFETY (NEEDLE) ×3 IMPLANT
PACK ABDOMINAL MAJOR (CUSTOM PROCEDURE TRAY) ×3 IMPLANT
PACK MINOR (CUSTOM PROCEDURE TRAY) ×3 IMPLANT
PAD ARMBOARD 7.5X6 YLW CONV (MISCELLANEOUS) ×3 IMPLANT
RTRCTR WOUND ALEXIS 18CM MED (MISCELLANEOUS) ×3
SET BASIN LINEN APH (SET/KITS/TRAYS/PACK) ×6 IMPLANT
STAPLER VISISTAT (STAPLE) ×3 IMPLANT
SUT MNCRL AB 4-0 PS2 18 (SUTURE) ×3 IMPLANT
SUT PDS AB CT VIOLET #0 27IN (SUTURE) ×6 IMPLANT
SUT PROLENE 2 0 SH 30 (SUTURE) ×3 IMPLANT
SUT SILK 0 FSL (SUTURE) ×3 IMPLANT
SUT SILK 3 0 SH CR/8 (SUTURE) ×3 IMPLANT
SUT VIC AB 3-0 SH 27 (SUTURE) ×10
SUT VIC AB 3-0 SH 27X BRD (SUTURE) ×5 IMPLANT
SYR 20CC LL (SYRINGE) ×6 IMPLANT
SYR 30ML LL (SYRINGE) ×3 IMPLANT
SYR CONTROL 10ML LL (SYRINGE) ×6 IMPLANT
TUBE J 18FR (TUBING) ×3 IMPLANT

## 2017-08-03 NOTE — Anesthesia Preprocedure Evaluation (Signed)
Anesthesia Evaluation  Patient identified by MRN, date of birth, ID band Patient awake    Reviewed: Allergy & Precautions, H&P , NPO status , Patient's Chart, lab work & pertinent test results, reviewed documented beta blocker date and time   Airway Mallampati: II  TM Distance: >3 FB Neck ROM: full    Dental no notable dental hx. (+) Teeth Intact, Dental Advidsory Given   Pulmonary neg pulmonary ROS, sleep apnea and Continuous Positive Airway Pressure Ventilation , Current Smoker,    Pulmonary exam normal breath sounds clear to auscultation       Cardiovascular Exercise Tolerance: Good hypertension, On Medications negative cardio ROS   Rhythm:regular Rate:Normal     Neuro/Psych negative neurological ROS  negative psych ROS   GI/Hepatic negative GI ROS, Neg liver ROS,   Endo/Other  negative endocrine ROS  Renal/GU negative Renal ROS  negative genitourinary   Musculoskeletal   Abdominal   Peds  Hematology negative hematology ROS (+)   Anesthesia Other Findings Recently dx with Gastric CA.  Unable to eat or drink as of past 7 days Plans for chemo and resection after med-port and J-tube today  Reproductive/Obstetrics negative OB ROS                             Anesthesia Physical Anesthesia Plan  ASA: III  Anesthesia Plan: General   Post-op Pain Management:    Induction:   PONV Risk Score and Plan:   Airway Management Planned:   Additional Equipment:   Intra-op Plan:   Post-operative Plan:   Informed Consent: I have reviewed the patients History and Physical, chart, labs and discussed the procedure including the risks, benefits and alternatives for the proposed anesthesia with the patient or authorized representative who has indicated his/her understanding and acceptance.   Dental Advisory Given  Plan Discussed with: CRNA and Anesthesiologist  Anesthesia Plan Comments:          Anesthesia Quick Evaluation

## 2017-08-03 NOTE — Progress Notes (Signed)
Brief Nutrition Note  Consult received for enteral/tube feeding initiation and management.   Adult Enteral Nutrition Protocol initiated. Full assessment to follow.  Osmolite 1.5 @ 40 ml/hr tonight and RD will provide further details following full evaluation of nutrition status and needs.  Admitting Dx: gastric cancer  Weight: 88.6 kg.     Labs:  Recent Labs  Lab 08/01/17 0900  NA 143  K 3.9  CL 107  CO2 27  BUN 15  CREATININE 0.97  CALCIUM 9.7  GLUCOSE 129Colman Cater MS,RD,CSG,LDN Office: [phone] Pager: [phone]

## 2017-08-03 NOTE — Progress Notes (Signed)
**Note De-Identified Phillip Lane Obfuscation** Patient instructed on IS; achieved 2500cc

## 2017-08-03 NOTE — Interval H&P Note (Signed)
History and Physical Interval Note:  08/03/2017 9:24 AM  Phillip Lane  has presented today for surgery, with the diagnosis of gastric cancer  The various methods of treatment have been discussed with the patient and family. After consideration of risks, benefits and other options for treatment, the patient has consented to  Procedure(s): INSERTION PORT-A-CATH (N/A) JEJUNOSTOMY TUBE (N/A) as a surgical intervention .  The patient's history has been reviewed, patient examined, no change in status, stable for surgery.  I have reviewed the patient's chart and labs.  Questions were answered to the patient's satisfaction.    Discussed jejunostomy tube placement with patient and wife, discussed risk and benefits of bleeding, infection, malfunction, need for more surgery, need to stay in for 6-8 weeks at a minimum.  No additional questions about port a catheter placement. Patient to be admitted for feedings/ home health arrangements.   Virl Cagey

## 2017-08-03 NOTE — Anesthesia Procedure Notes (Signed)
Procedure Name: Intubation Date/Time: 08/03/2017 10:02 AM Performed by: Ollen Bowl, CRNA Pre-anesthesia Checklist: Patient identified, Patient being monitored, Timeout performed, Emergency Drugs available and Suction available Patient Re-evaluated:Patient Re-evaluated prior to induction Oxygen Delivery Method: Circle System Utilized Preoxygenation: Pre-oxygenation with 100% oxygen Induction Type: IV induction Ventilation: Mask ventilation without difficulty Laryngoscope Size: Mac and 3 Grade View: Grade II Tube type: Oral Tube size: 7.0 mm Number of attempts: 1 Airway Equipment and Method: stylet Placement Confirmation: ETT inserted through vocal cords under direct vision,  positive ETCO2 and breath sounds checked- equal and bilateral Secured at: 21 cm Tube secured with: Tape Dental Injury: Teeth and Oropharynx as per pre-operative assessment

## 2017-08-03 NOTE — Transfer of Care (Signed)
Immediate Anesthesia Transfer of Care Note  Patient: Phillip Lane  Procedure(s) Performed: INSERTION PORT-A-CATH (N/A ) OPEN JEJUNOSTOMY TUBE PLACEMENT (N/A )  Patient Location: PACU  Anesthesia Type:General  Level of Consciousness: awake and alert   Airway & Oxygen Therapy: Patient Spontanous Breathing and Patient connected to face mask oxygen  Post-op Assessment: Report given to RN  Post vital signs: Reviewed and stable  Last Vitals:  Vitals Value Taken Time  BP 191/102 08/03/2017 12:00 PM  Temp    Pulse 68 08/03/2017 12:03 PM  Resp 20 08/03/2017 12:03 PM  SpO2 100 % 08/03/2017 12:03 PM  Vitals shown include unvalidated device data.  Last Pain:  Vitals:   08/03/17 0808  TempSrc: Oral  PainSc: 0-No pain      Patients Stated Pain Goal: 8 (35/36/14 4315)  Complications: No apparent anesthesia complications

## 2017-08-03 NOTE — Progress Notes (Signed)
Did not want feeds started until am. Rn holding now. No issues reported. Has received about 2 hrs of feeds, 80cc. Flushing tube now.  Curlene Labrum, MD

## 2017-08-03 NOTE — Op Note (Signed)
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 Constance Haw, MD   Assistants: Aviva Signs, MD (Jejunostomy tube portion)    Anesthesia: General endotracheal   Anesthesiologist: Dr. Currie Paris, MD    Specimens:  None    Estimated Blood Loss: Minimal   Blood Replacement: None    Complications: None    Wound Class: Clean - port a catheter placement; Clean contaminated - jejunostomy tube    Operative Indications: Phillip Lane is a 62 yo with a new diagnosed gastroesophageal adenocarcinoma who is giong to undergo chemotherapy and radiation, and has been having progressive dysphagia.  He is only able to take in sips of thin liquids at this time, and given this we have decided to place a port a catheter in addition to his jejunostomy tube for feeding. After a discussion of the risk and benefits of both procedures including bleeding, infection, risk of malfunction of either and risk of requiring additional surgeries, the patient opted to proceed.   Findings: Normal venous anatomy for port a catheter placement; No liver nodules or peritoneal nodules noted, normal intraabdominal findings/ healthy bowel    Procedure: The patient was taken to the operating room and placed supine. General endotracheal anesthesia was induced. Intravenous antibiotics were administered per protocol.  An orogastric tube positioned to decompress the stomach. The right chest was prepared and draped in the usual sterile fashion.    An incision was made below the right clavicle. A subcutaneous pocket was formed. The needles advanced into the right subclavian vein using the Seldinger technique without  difficulty. A guidewire was then advanced into the right atrium under fluoroscopic guidance.  Ectopia was noted but he had some prior PVC also. An introducer and peel-away sheath were placed over the guidewire. The catheter was then inserted through the peel-away sheath and the peel-away sheath was removed.  A spot film was performed to confirm the position. The catheter was then attached to the port and the port placed in subcutaneous pocket. On fluoroscopy, the catheter was a little deep, so we trimmed the catheter and reattached the port to the catheter.  Final adequate positioning was confirmed by fluoroscopy. Hemostasis was confirmed, and the port was secured with 2-0 prolene sutures.  Good backflow of blood was noted on aspiration of the port. The port was flushed with heparin flush. Subcutaneous layer was reapproximated using a 3-0 Vicryl interrupted suture. The skin was closed using a 4-0 Vicryl subcuticular suture. Dermabond was applied.  Everyone re-gowned and gloved, and the abdomen was prepped and and draped in the normal fashion. A second time out was performed verifying the jejunostomy portion of the case.   A small midline incision was made and carried down through the subcutaneous tissue with electrocautery. The abdomen was entered without issue, and a small wound manager was placed.  The liver was noted to have no nodules and there was no obvious peritoneal studding. The small bowel was ran and the ligament of treitz was identified.  A small left upper abdominal stab incision was made to place the jejunotomy feeding tube through, and this was pulled through the skin and subcutaneous tissue and muscle and into the abdomen.  Approximately 30 cm from the ligament of treitz an  area of the jejunum was found. Laparotomy pads were laid out and a small enterotomy was made.  The jejunostomy tube was very [place] and this was trimmed to about 15cm from the balloon.  The feeding tube was inserted into the  jejunum and the balloon was filled with 7cc of sterile water.  The balloon and tube moved easily in the jejunum. The enterotomy and the feeding tube was oversewed in a witzeled fashion with 3-0 vicryl suture for about 4-5cm past the enterotomy site. The jejunum was then tacked to the anterior abdominal wall at the feeding tube entry site with 3-0 Vicryl suture in two places anterior and posterior to the feeding tube.  The jejunum was verified to be against the abdominal wall and not under any tension. The balloon was filled to an appropriate amount to not occlude the lumen.  The bolster was placed to be at adequate tension on the skin, and the tube from the bolster to the part where the J-tube becomes wider was measured at 8cm and marked with a 0-silk suture.    The abdomen was closed with a running 0 PDS suture and staples. Xparel was placed in the incision and a sterile dressing was applied.   All tape and needle counts were correct at the end of both procedures. The patient was transferred to PACU in stable condition. A chest x-ray will be performed at that time and an abdominal binder was placed.   Curlene Labrum, MD [facility] [address] [place], [place] [address] [phone] (office)

## 2017-08-03 NOTE — Anesthesia Postprocedure Evaluation (Signed)
Anesthesia Post Note  Patient: Phillip Lane  Procedure(s) Performed: INSERTION PORT-A-CATH (N/A ) OPEN JEJUNOSTOMY TUBE PLACEMENT (N/A )  Patient location during evaluation: PACU Anesthesia Type: General Level of consciousness: awake and alert and oriented Pain management: pain level controlled Vital Signs Assessment: post-procedure vital signs reviewed and stable Respiratory status: spontaneous breathing Cardiovascular status: blood pressure returned to baseline and stable Postop Assessment: no apparent nausea or vomiting Anesthetic complications: no     Last Vitals:  Vitals:   08/03/17 1244 08/03/17 1245  BP: (!) 177/80   Pulse: 79 77  Resp: 14   Temp:    SpO2: 94% 94%    Last Pain:  Vitals:   08/03/17 1250  TempSrc:   PainSc: 8                  Kamea Dacosta

## 2017-08-04 ENCOUNTER — Encounter (HOSPITAL_COMMUNITY): Payer: Self-pay | Admitting: General Surgery

## 2017-08-04 ENCOUNTER — Telehealth: Payer: Self-pay | Admitting: Gastroenterology

## 2017-08-04 ENCOUNTER — Other Ambulatory Visit: Payer: Self-pay | Admitting: Dietician

## 2017-08-04 LAB — BASIC METABOLIC PANEL
Anion gap: 7 (ref 5–15)
BUN: 15 mg/dL (ref 6–20)
CALCIUM: 9.3 mg/dL (ref 8.9–10.3)
CHLORIDE: 102 mmol/L (ref 101–111)
CO2: 32 mmol/L (ref 22–32)
CREATININE: 0.93 mg/dL (ref 0.61–1.24)
GFR calc Af Amer: >60 mL/min (ref >60)
GFR calc non Af Amer: >60 mL/min (ref >60)
Glucose, Bld: 105 mg/dL — ABNORMAL HIGH (ref 65–99)
Potassium: 4.3 mmol/L (ref 3.5–5.1)
SODIUM: 141 mmol/L (ref 135–145)

## 2017-08-04 LAB — CBC WITH DIFFERENTIAL/PLATELET
Basophils Absolute: 0 10*3/uL (ref 0.0–0.1)
Basophils Relative: 0 %
EOS PCT: 1 %
Eosinophils Absolute: 0.1 10*3/uL (ref 0.0–0.7)
HEMATOCRIT: 48.2 % (ref 39.0–52.0)
Hemoglobin: 15.6 g/dL (ref 13.0–17.0)
Lymphocytes Relative: 21 %
Lymphs Abs: 2.4 10*3/uL (ref 0.7–4.0)
MCH: 30.5 pg (ref 26.0–34.0)
MCHC: 32.4 g/dL (ref 30.0–36.0)
MCV: 94.1 fL (ref 78.0–100.0)
MONO ABS: 0.7 10*3/uL (ref 0.1–1.0)
MONOS PCT: 7 %
NEUTROS ABS: 7.9 10*3/uL — AB (ref 1.7–7.7)
Neutrophils Relative %: 71 %
Platelets: 182 10*3/uL (ref 150–400)
RBC: 5.12 MIL/uL (ref 4.22–5.81)
RDW: 12.9 % (ref 11.5–15.5)
WBC: 11.2 10*3/uL — ABNORMAL HIGH (ref 4.0–10.5)

## 2017-08-04 LAB — MAGNESIUM: MAGNESIUM: 1.9 mg/dL (ref 1.7–2.4)

## 2017-08-04 LAB — PHOSPHORUS: Phosphorus: 3.4 mg/dL (ref 2.5–4.6)

## 2017-08-04 MED ORDER — OSMOLITE 1.5 CAL PO LIQD
1000.0000 mL | ORAL | Status: DC
  Administered 2017-08-04: 1000 mL
  Filled 2017-08-04 (×2): qty 1000

## 2017-08-04 MED ORDER — ENSURE ENLIVE PO LIQD
237.0000 mL | Freq: Two times a day (BID) | ORAL | Status: DC
  Administered 2017-08-04: 237 mL via ORAL

## 2017-08-04 MED ORDER — HYDROMORPHONE HCL 1 MG/ML IJ SOLN
0.5000 mg | INTRAMUSCULAR | Status: DC | PRN
  Administered 2017-08-04 – 2017-08-09 (×10): 0.5 mg via INTRAVENOUS
  Filled 2017-08-04 (×10): qty 0.5

## 2017-08-04 MED ORDER — OSMOLITE 1.5 CAL PO LIQD
237.0000 mL | ORAL | Status: DC
  Filled 2017-08-04 (×6): qty 237

## 2017-08-04 MED ORDER — OSMOLITE 1.5 CAL PO LIQD: 237.0000 mL | ORAL | Status: DC

## 2017-08-04 MED ORDER — KETOROLAC TROMETHAMINE 15 MG/ML IJ SOLN
15.0000 mg | Freq: Four times a day (QID) | INTRAMUSCULAR | Status: DC
  Administered 2017-08-04 – 2017-08-09 (×19): 15 mg via INTRAVENOUS
  Filled 2017-08-04 (×20): qty 1

## 2017-08-04 MED ORDER — OSMOLITE 1.5 CAL PO LIQD
1000.0000 mL | ORAL | Status: AC
  Administered 2017-08-04: 1000 mL
  Filled 2017-08-04 (×2): qty 1000

## 2017-08-04 MED ORDER — FREE WATER
200.0000 mL | Status: DC
  Administered 2017-08-04 – 2017-08-06 (×8): 200 mL

## 2017-08-04 NOTE — Progress Notes (Signed)
[facility] Progress Note  1 Day Post-Op  Subjective: No major issues reported. Had some pain overnight and did not sleep much.  Overall no major complaints.   Objective: Vital signs in last 24 hours: Temp:  [97.6 F (36.4 C)-98.4 F (36.9 C)] 97.6 F (36.4 C) (06/13 0603) Pulse Rate:  [66-83] 83 (06/13 0603) BP: (135-145)/(76-96) 145/96 (06/13 0603) SpO2:  [90 %-93 %] 90 % (06/13 0603) Weight:  [195 lb 6.4 oz (88.6 kg)] 195 lb 6.4 oz (88.6 kg) (06/13 1149) Last BM Date: 08/03/17  Intake/Output from previous day: 06/12 0701 - 06/13 0700 In: 2880.9 [I.V.:2326.3; NG/GT:411.3; IV Piggyback:143.3] Out: 20 [Blood:20] Intake/Output this shift: Total I/O In: 240 [P.O.:240] Out: -   General appearance: alert, cooperative and no distress Resp: normal work breathing, port site on right chest c/d/i with dermabond GI: soft, appropriately tender, honeycomeb dressing without drainage, no signs of erythema, Jtube in place in LLQ, no drainage, minimally distended Extremities: extremities normal, atraumatic, no cyanosis or edema  Lab Results:  Recent Labs    08/04/17 0609  WBC 11.2*  HGB 15.6  HCT 48.2  PLT 182   BMET Recent Labs    08/04/17 0609  NA 141  K 4.3  CL 102  CO2 32  GLUCOSE 105*  BUN 15  CREATININE 0.93  CALCIUM 9.3    Studies/Results: Dg Chest Port 1 View  Result Date: 08/03/2017 CLINICAL DATA:  62 year old male status post surgical right Port-A-Cath and jejunostomy tube placement today. GE junction adenocarcinoma. EXAM: PORTABLE CHEST 1 VIEW COMPARISON:  PET-CT 07/12/2017 FINDINGS: Portable AP upright view at 1209 hours. Right subclavian approach chest porta cath in place. Catheter tip projects at the cavoatrial junction level. Mediastinal contours remain within normal limits. No pneumothorax, pulmonary edema, pleural effusion or consolidation. Small volume pneumoperitoneum suspected in the upper abdomen likely related to jejunostomy tube  placement. Paucity of upper abdominal bowel gas. IMPRESSION: 1. Right subclavian approach Port-A-Cath placement with no adverse features. Catheter tip at the cavoatrial junction level. 2. Small volume pneumoperitoneum, most likely related to jejunostomy tube placement today. Electronically Signed   By: Genevie Ann M.D.   On: 08/03/2017 12:25   Dg C-arm 1-60 Min-no Report  Result Date: 08/03/2017 Fluoroscopy was utilized by the requesting physician.  No radiographic interpretation.    ssessment/Plan: Mr. Taillon is a 62 yo with gastroesophageal junction cancer requiring port placement and jejunostomy feeding tube placement for progressive dysphagia. Doing fair.  Added toradol for pain control, changed morphine to dilaudid, encouraged Roxicodone intake  IS, OOB< ambulate Clear liquids ok, dietitian seeing for tube feed initiation Labs look ok LR stop this afternoon given intake  SCDs, heparin sq  Potentially home Friday/ Saturday with home health Abdominal binder in place  Case managers working on home health    LOS: 1 day    Virl Cagey 08/04/2017

## 2017-08-04 NOTE — Care Management (Signed)
Face-to-face encounter (required for Medicare/Medicaid patients) (Order [id number])  Nursing  Date: 08/04/2017 Department: [facility] Ordering/Authorizing: Virl Cagey, MD   Virl Cagey, MD NPI: [id number]    Patient Information   Patient Name Phillip Lane, Phillip Lane Sex Male DOB [birth year] SSN [id number]  Order Information   Order Date/Time Release Date/Time Start Date/Time End Date/Time  08/04/17 01:05 PM None 08/04/17 01:05 PM 08/04/17 01:05 PM  Order History  Inpatient  Date/Time Action Taken User Additional Information  08/04/17 1305 Sign Virl Cagey, MD   08/04/17 1305 Release Instance Virl Cagey, MD (auto-released) Released Order: [id number]  Order Questions   Question Answer Comment  The encounter with the patient was in whole, or in part, for the following medical condition, which is the primary reason for home health care jejunostomy tube in place   I certify that, based on my findings, the following services are medically necessary home health services Nursing   Reason for Medically [facility]- Teaching of Disease Process/Symptom Management   My clinical findings support the need for the above services OTHER SEE COMMENTS   Further, I certify that my clinical findings support that this patient is homebound due to: Pain interferes with ambulation/mobility       Comments   I Virl Cagey certify that this patient is under my care and that I, or a nurse practitioner or physician's assistant working with me, had a face-to-face encounter that meets the physician face-to-face encounter requirements with this patient on 08/04/2017. The encounter with the patient was in whole, or in part for the following medical condition(s) which is the primary reason for home health care (List medical condition): gastric cancer and new jejunotomy tube. Needs teaching at home and help with tube care until family and  patient educated and comfortable      Standing Order Information   Remaining Occurrences Interval Last Released     0/1 Once 08/04/2017

## 2017-08-04 NOTE — Care Management (Signed)
Patient Information   Patient Name Phillip Lane, Phillip Lane ([id number]) Sex Male DOB Feb 15, 1956  Room Bed  A307 A307-01  Patient Demographics   Address [place] [address] Phone [phone] [facility]) [phone] (Mobile) E-mail Address [email or web address]  Patient Ethnicity & Race   Ethnic Group Patient Race  Not Hispanic or Latino White or Caucasian  Emergency Contact(s)   Name Relation Home Work Mobile  Buntin,Cherie Spouse [phone]  [phone]  Documents on File    Status Date Received Description  Documents for the Patient  [place] Not Received    [place] E-Signature HIPAA Notice of Privacy Signed 36/62/94   Driver's License Received 76/54/65 exp 8.21.2025  Insurance Card Received 06/21/17 bcbs 2019  Advance Directives/Living Will/HCPOA/POA Not Received    Other Photo ID Not Received    [place] E-Signature HIPAA Notice of Privacy Spanish     Release of Information Not Received    Advanced Beneficiary Notice (ABN) Not Received    E-Signature AOB Spanish Not Received    HIM ROI Authorization  07/29/17 pet 06/23/17 pushed to power share and report and egd 06/21/17 report faxed to [facility]  Patient Photo   Photo of Patient  Documents for the Encounter  AOB (Assignment of Insurance Benefits) Not Received    E-signature AOB Signed 08/03/17   MEDICARE RIGHTS Not Received    E-signature Medicare Rights     Admission Information   Attending Provider Admitting Provider Admission Type Admission Date/Time  Virl Cagey, MD Virl Cagey, MD Urgent 08/03/17 [phone]  Discharge Date Hospital Service Auth/Cert Status Service Area   Surgery Incomplete [facility]  Unit Room/Bed Admission Status   AP-DEPT 300 A307/A307-01 Admission (Confirmed)   Admission   Complaint  gastric cancer  Hospital Account   Name Acct ID Class Status Primary Coverage  Phillip Lane, Phillip Lane [id number] Inpatient Open BLUE CROSS [place]      Guarantor Account (for [place] [id number])   Name Relation to [place]? Acct Type  Starleen Blue Self CHSA Yes Personal/Family  Address Phone    [address] [place] [address] [phone])        Coverage Information (for Hospital Account [id number])   F/O Payor/Plan Precert #  [facility] OTHER   Subscriber Subscriber #  Phillip Lane, Phillip Lane [id number]  Address Phone  [address] [place], [place] [address] [phone]

## 2017-08-04 NOTE — Progress Notes (Addendum)
Followed up with patient/spouse.   Reviewed information that had been discussed with patient earlier. Elaborated on nutrition care plan while he is undergoing tx. Discussed planned TF titration. Gave detailed tube feeding education on what pump feeding looks like, proper care of the TF formula/jejunostomy site, tube flushing, potential signs of intolerance, weight tracking, anticipated changes to bowels, adjustments that can be made depending on PO intake, medications etc.   Answered all nutrition related questions from spouse. She has RD contact information if tolerance issues arise.   TF was infusing at 80 while RD there. Pt denied any cramping, nausea, distension or other s/s intolerance  Addendum. Increased to 95. After fours hours, if tolerating, plan to advance to 110. His TF was restarted this afternoon at ~1430. TF should be stopped at ~0400 as he would have received the goal volume for his planned TF regimen at that point.    Will briefly follow up tomorrow to assure TF tolerance. CM notes that [facility] will provide supplies. Representative in to see this afternoon.    Burtis Junes RD, LDN, CNSC Clinical Nutrition Available Tues-Sat via Pager: [phone] 08/04/2017 3:14 PM

## 2017-08-04 NOTE — Telephone Encounter (Signed)
The EUS was faxed to both Dr Heber [place] and Dr Quitman Livings

## 2017-08-04 NOTE — Anesthesia Postprocedure Evaluation (Signed)
Anesthesia Post Note  Patient: Phillip Lane  Procedure(s) Performed: INSERTION PORT-A-CATH (N/A ) OPEN JEJUNOSTOMY TUBE PLACEMENT (N/A )  Patient location during evaluation: Nursing Unit Anesthesia Type: General Level of consciousness: awake and alert and patient cooperative Pain management: pain level controlled Vital Signs Assessment: post-procedure vital signs reviewed and stable Respiratory status: spontaneous breathing, nonlabored ventilation and respiratory function stable Cardiovascular status: blood pressure returned to baseline Postop Assessment: no apparent nausea or vomiting Anesthetic complications: no     Last Vitals:  Vitals:   08/03/17 2113 08/04/17 0603  BP: 135/76 (!) 145/96  Pulse: 66 83  Resp:    Temp: 36.9 C 36.4 C  SpO2: 93% 90%    Last Pain:  Vitals:   08/04/17 0603  TempSrc: Oral  PainSc:                  Skylarr Liz J

## 2017-08-04 NOTE — Telephone Encounter (Signed)
I called to confirm that both offices received the fax.  I left a message with Dr Heber [place] and spoke with the scheduler at Dr Unk Lightning office and she will call back if she does not receive the fax in the next 30 minutes

## 2017-08-04 NOTE — Care Management (Addendum)
[facility] will supply Osmolite and pump. A patient service technician will teach patient/family how to use pump.  [facility] will supply Home health RN for follow up post Discharge.  Potential DC Friday or Saturday.

## 2017-08-04 NOTE — Addendum Note (Signed)
Addendum  created 08/04/17 0750 by Charmaine Downs, CRNA   Sign clinical note

## 2017-08-04 NOTE — Care Management Note (Addendum)
Case Management Note  Patient Details  Name: Phillip Lane MRN: [id number] Date of Birth: 12/29/1955  Subjective/Objective:         Gastroesophageal cancer. From home with wife. Jejunostomy tube, Right port a cath placement 08/03/2017.      Action/Plan: DC with home health RN and jejunostomy feedings. Patient lives in [place], [place]. Family deciding which home health agencies they would like to use and CM will follow up with tube feeding options.   ADDENDUM: Family familiar with [facility].  Will call [facility] to see if they can manage all patient's needs.   --Have called [facility], [facility] , [facility] and [facility], all of which can not accept patient due to Insurance or can not provide services needed.   ADDENDUM 1215: - [facility] can provide Osmolite feedings /supplies.  Expected Discharge Date:     08/05/2017             Expected Discharge Plan:  [place]  In-House Referral:     Discharge planning Services  CM Consult  Post Acute Care Choice:  Home Health Choice offered to:  Patient  DME Arranged:    DME Agency:     HH Arranged:  RN, TPN HH Agency:     Status of Service:  In process, will continue to follow  If discussed at [place] Length of Stay Meetings, dates discussed:    Additional Comments:  Lovey Crupi, Chauncey Reading, RN 08/04/2017, 9:07 AM

## 2017-08-04 NOTE — Care Management (Signed)
Home Health (Order [id number])  Nursing  Date: 08/04/2017 Department: [facility] Ordering/Authorizing: Virl Cagey, MD   Virl Cagey, MD NPI: [id number]    Patient Information   Patient Name Phillip Lane, Phillip Lane Sex Male DOB 05-26-1955 SSN [id number]  Order Information   Order Date/Time Release Date/Time Start Date/Time End Date/Time  08/04/17 01:05 PM None 08/04/17 01:05 PM Until Specified  Order History  Inpatient  Date/Time Action Taken User Additional Information  08/04/17 1305 Sign Virl Cagey, MD   08/04/17 1305 Release Instance Virl Cagey, MD (auto-released) Released Order: [id number]  Order Questions   Question Answer Comment  To provide the following care/treatments RN       Standing Order Information   Remaining Occurrences Interval Last Released     0/1 At discharge 08/04/2017         Collection Information    Encounter   View Encounter

## 2017-08-04 NOTE — Progress Notes (Addendum)
Initial Nutrition Assessment  DOCUMENTATION CODES:  Not applicable  INTERVENTION:  Restart Osmolite 1.5 at 80 cc/hr.  Advance by 15 q 4 hrs with goal of 110cc/hr with d/c plan being for a nocturnal TF regimen of osmolite 1.5 @ 110 x 12 hrs qd. This would provide 1980 kcals (~83% of estimated needs), 83g pro (72%), 1006 ml fluid. Anticipate will be able to meet remainder of 2-3 Ensures/day  He will need to either drink 40-50oz of water/day or flush with 200 cc q4 hrs to meet estimated fluid needs.    RD to follow outpatient and regimen can be titrated based on tolerance, level of PO intake and weight.    Education materials given to patient, as well as RD contact information  NUTRITION DIAGNOSIS:  Increased nutrient needs related to cancer and cancer related treatments as evidenced by estimated nutritional requirements for this condition/anticipated tx regimen.   GOAL:  Patient will meet greater than or equal to 90% of their needs  MONITOR:  PO intake, Diet advancement, Labs, Supplement acceptance, TF tolerance, Weight trends  REASON FOR ASSESSMENT:  Consult Enteral/tube feeding initiation and management(Jejunostomy)  ASSESSMENT:  62 y/o male PMHx HTN/HLD, OSA. Abruptly developed dysphagia to meats end of April and presented to ED on 4/30 S/P egd, which did not show mass, but GEJ biopsy returned + for adenocarcinoma. Referred to Oncology. Full workup revealed limited disease and was planned to begin concomitant chemoradiation w/ curative intent. However, developed severe, rapidly progressive dysphagia following EUS. Pt referred to hospital for cath placement and open J tube insertion. RD consulted for TF.    Patient alone on RD arrival. He received 2 hrs of osmolite 1.5 @ 40 yesterday. He denies having side effects from that infusion. TF was held per MD.   Patient reports that he was at baseline until he abruptly developed dysphagia. He denies having any prior weight/appetite loss,  dysphagia, n/v or malaise. However, since his dx, he reports progressive dysphagia requiring him to switch to a FL diet. Recently, he has been maintaining himself on 2 ensures every fours hrs. However, his dysphagia progressed to point where even Ensure was hard to drink, hence  Begun to lose weight. He says his UBW is 205-215 lbs. No weight from this admission documented. Bed weight today was about consistent with his Onc appt wt on 6/10-will carry over; this Loss of ~10 lbs in 1.5 is not clinically significant.   Reviewed with patients the basic of feeding via jejunostomy. We discussed different feeding plans and tried to develop a plan that would be compatible with his lifestyle/habits. He says he sleeps "maybe 8 hrs" per day and is out and about the majority of the day, hence a quicker, nocturnal feed is preferred. He also does have oral intake, which may allow for less TF dependence. Given that he tolerated osmolite 1.5 well, will continue this concentrated, fiber-free formula to help reduce rate. Reviewed flushing, how its done, why its done. Showed example of tube feeding bag/pump.   He does not know what home-health agency he will be with yet. RD had conference with case manager regarding this. She is still working on finding a company.   RD discussed plan with surgeon. She was OK with restarting TF at this time and beginning at higher rate to facilitate quicker assessment of tolerance/solidification of TF plan. Per conversation, thought better to try to meet majority of needs with TF given he is still on CL diet and the level of  PO intake he will be able to maintain is still unknown. Will aim TF regimen to meer ~80-90% needs (see needs below)  Physical exam-no discernible muscle/fat wasting. Has abdominal binder on-unable to assess abdomen.   Labs: WDL, only slight leukocytosis/hyperglycemia  Meds: Ensure, Colace, toradol, IVF  Recent Labs  Lab 08/01/17 0900 08/04/17 0609  NA 143 141  K  3.9 4.3  CL 107 102  CO2 27 32  BUN 15 15  CREATININE 0.97 0.93  CALCIUM 9.7 9.3  MG  --  1.9  PHOS  --  3.4  GLUCOSE 129* 105*   NUTRITION - FOCUSED PHYSICAL EXAM: WDL Diet Order:   Diet Order           Diet clear liquid Room service appropriate? Yes; Fluid consistency: Thin  Diet effective now         EDUCATION NEEDS:  Education needs have been addressed  Skin:  Surgical incisions to chest/abdomen  Last BM:  6/12  Height:  Ht Readings from Last 1 Encounters:  07/28/17 5\' 10"  (1.778 m)   Weight:  Wt Readings from Last 1 Encounters:  08/04/17 195 lb 6.4 oz (88.6 kg)   Wt Readings from Last 10 Encounters:  08/04/17 195 lb 6.4 oz (88.6 kg)  08/01/17 195 lb 6.4 oz (88.6 kg)  07/28/17 199 lb (90.3 kg)  07/26/17 199 lb (90.3 kg)  07/13/17 200 lb (90.7 kg)  07/04/17 201 lb 12.8 oz (91.5 kg)  06/21/17 205 lb (93 kg)   Ideal Body Weight:  75.45 kg  BMI:  Body mass index is 28.04 kg/m.  Estimated Nutritional Needs: (DURING CONCOMITANT CHEMORADIATION) Kcal:  2400-2650 kcals (27-30 kcal/kg bw) Protein:  115-133g/kg bw Fluid:  >2.4 L/day  Burtis Junes RD, LDN, CNSC Clinical Nutrition Available Tues-Sat via Pager: [phone] 08/04/2017 12:44 PM

## 2017-08-04 NOTE — Telephone Encounter (Signed)
I was sent a copy of recent oncology office note by Dr. Quitman Livings.  In the note he writes that the patient never underwent endoscopic ultrasound but that is not the case.  I performed the endoscopic ultrasound for this man last week and sent copies of the report to Dr. Heber [place], or at least tried to send copies to Dr. Heber [place].  Possibly I should have sent it to Dr. Quitman Livings.Phillip Lane, Can you please contact Dr. Quitman Livings and [place] offices (through the cancer center in [place]) and make sure that they received a copy of last week's upper endoscopic ultrasound report.  Please apologize if there were any errors in communicatio. Thank you

## 2017-08-05 ENCOUNTER — Other Ambulatory Visit: Payer: Self-pay | Admitting: Dietician

## 2017-08-05 ENCOUNTER — Inpatient Hospital Stay (HOSPITAL_COMMUNITY): Payer: BLUE CROSS/BLUE SHIELD

## 2017-08-05 LAB — CBC WITH DIFFERENTIAL/PLATELET
Basophils Absolute: 0 10*3/uL (ref 0.0–0.1)
Basophils Relative: 0 %
EOS ABS: 0.2 10*3/uL (ref 0.0–0.7)
EOS PCT: 3 %
HCT: 48.1 % (ref 39.0–52.0)
Hemoglobin: 15.4 g/dL (ref 13.0–17.0)
LYMPHS ABS: 2.5 10*3/uL (ref 0.7–4.0)
Lymphocytes Relative: 29 %
MCH: 30.2 pg (ref 26.0–34.0)
MCHC: 32 g/dL (ref 30.0–36.0)
MCV: 94.3 fL (ref 78.0–100.0)
MONO ABS: 0.9 10*3/uL (ref 0.1–1.0)
MONOS PCT: 11 %
Neutro Abs: 4.9 10*3/uL (ref 1.7–7.7)
Neutrophils Relative %: 57 %
PLATELETS: 181 10*3/uL (ref 150–400)
RBC: 5.1 MIL/uL (ref 4.22–5.81)
RDW: 12.7 % (ref 11.5–15.5)
WBC: 8.5 10*3/uL (ref 4.0–10.5)

## 2017-08-05 LAB — BASIC METABOLIC PANEL
Anion gap: 9 (ref 5–15)
BUN: 17 mg/dL (ref 6–20)
CO2: 35 mmol/L — AB (ref 22–32)
CREATININE: 1 mg/dL (ref 0.61–1.24)
Calcium: 9.2 mg/dL (ref 8.9–10.3)
Chloride: 100 mmol/L — ABNORMAL LOW (ref 101–111)
GFR calc Af Amer: >60 mL/min (ref >60)
GFR calc non Af Amer: >60 mL/min (ref >60)
GLUCOSE: 93 mg/dL (ref 65–99)
Potassium: 4 mmol/L (ref 3.5–5.1)
SODIUM: 144 mmol/L (ref 135–145)

## 2017-08-05 MED ORDER — ONDANSETRON HCL 4 MG PO TABS
4.0000 mg | ORAL_TABLET | Freq: Four times a day (QID) | ORAL | Status: DC
  Administered 2017-08-05 – 2017-08-06 (×3): 4 mg via ORAL
  Filled 2017-08-05 (×4): qty 1

## 2017-08-05 MED ORDER — BISACODYL 10 MG RE SUPP
10.0000 mg | Freq: Once | RECTAL | Status: AC
  Administered 2017-08-05: 10 mg via RECTAL
  Filled 2017-08-05: qty 1

## 2017-08-05 MED ORDER — PROMETHAZINE HCL 25 MG/ML IJ SOLN
12.5000 mg | INTRAMUSCULAR | Status: DC | PRN
  Administered 2017-08-05 – 2017-08-10 (×9): 12.5 mg via INTRAVENOUS
  Filled 2017-08-05 (×9): qty 1

## 2017-08-05 MED ORDER — OSMOLITE 1.5 CAL PO LIQD: 1320.0000 mL | ORAL | Status: DC

## 2017-08-05 MED ORDER — KCL IN DEXTROSE-NACL 20-5-0.45 MEQ/L-%-% IV SOLN
INTRAVENOUS | Status: DC
  Administered 2017-08-05 – 2017-08-11 (×10): via INTRAVENOUS

## 2017-08-05 NOTE — Care Management Note (Signed)
Case Management Note  Patient Details  Name: Phillip Lane MRN: [id number] Date of Birth: 07-Aug-1955  Expected Discharge Date:     08/06/2017            Expected Discharge Plan:  [place]  In-House Referral:  Nutrition  Discharge planning Services  CM Consult  Post Acute Care Choice:  Home Health Choice offered to:  Patient  HH Arranged:  RN [facility] Agency:  Other - See comment, [facility]  Status of Service:  Completed, signed off  If discussed at [place] of Stay Meetings, dates discussed:    Additional Comments: [place] home over weekend. [facility] rep working with dietician on TF's; aware of DC over weekend. CM has contacted [facility] [place] who is planning on pt DC tomorrow; they confirm they have everything they need for referral.   Sherald Barge, RN 08/05/2017, 1:07 PM

## 2017-08-05 NOTE — Progress Notes (Signed)
[facility]  KUB- stool in colon. No dilated bowel, more paucity of bowel. Continue with holding TF tonight. Nausea meds ordered. IVF @ 50.   Curlene Labrum, MD [facility] [address] [place], [place] [address] [phone] (office)

## 2017-08-05 NOTE — Progress Notes (Signed)
[facility] Progress Note  2 Days Post-Op  Subjective: Vomiting since this AM. No BM. Tried a suppository. Flushed J tube without issues.   Objective: Vital signs in last 24 hours: Temp:  [97.4 F (36.3 C)-98.6 F (37 C)] 97.4 F (36.3 C) (06/14 1419) Pulse Rate:  [79-91] 79 (06/14 1419) Resp:  [20] 20 (06/14 1419) BP: (147-174)/(79-93) 174/93 (06/14 1419) SpO2:  [92 %-93 %] 93 % (06/14 1419) Weight:  [194 lb 3.6 oz (88.1 kg)] 194 lb 3.6 oz (88.1 kg) (06/14 0519) Last BM Date: 08/03/17  Intake/Output from previous day: 06/13 0701 - 06/14 0700 In: 2546.1 [P.O.:240; NG/GT:2306.1] Out: -  Intake/Output this shift: Total I/O In: 480 [P.O.:480] Out: -   General appearance: alert, cooperative and no distress Resp: normal work of breathing GI: soft, somewhat distended, j tube in place, honeycomb in place without staining  Lab Results:  Recent Labs    08/04/17 0609 08/05/17 0519  WBC 11.2* 8.5  HGB 15.6 15.4  HCT 48.2 48.1  PLT 182 181   BMET Recent Labs    08/04/17 0609 08/05/17 0519  NA 141 144  K 4.3 4.0  CL 102 100*  CO2 32 35*  GLUCOSE 105* 93  BUN 15 17  CREATININE 0.93 1.00  CALCIUM 9.3 9.2    Assessment/Plan: Phillip Lane is a 62 yo with gastric cancer and dysphagia. Likely with some degree of ileus and possible J tube intolerance.   -PRN for pain -Scheduled some nausea medication and PRN  -Holding TF for now, will likely hold overnight unless has BM, KUB ordered but not completed yet -CBC wnl  -SCDs, heparin sq -Nutrition recommending probably patient on 60cc Osmolite 1.5 at night to see if tolerates once doing better    LOS: 2 days    Phillip Lane 08/05/2017

## 2017-08-05 NOTE — Progress Notes (Signed)
Initial Nutrition Assessment  DOCUMENTATION CODES:  Not applicable  INTERVENTION:   TF held for now until MD gives order to restart. Will hold rate at 60 cc/hr x 12 rs upon reinitiation  New D/C TF plan. Gradually titrated TF:  Osmolite 1.5 @60cc  x 12 hrs. He is to advance rate by 25 cc/hr Q2 days, as tolerating, to goal of 110 cc/hr. Educated pt/spouse.   Goal rate, which would be reached in 4 days, provides 1980 kcals (~83% of est. needs), 83g pro (72%), 1006 ml fluid.   Once TF at goal, antcipate will be able to meet remainder of needs w/ 2-3 Ensures/day  He will need to either drink 40-50oz of water/day or flush with 200 cc q4 hrs to meet estimated fluid needs.    RD to follow outpatient and regimen can be titrated based on tolerance, level of PO intake and weight.    NUTRITION DIAGNOSIS:  Increased nutrient needs related to cancer and cancer related treatments as evidenced by estimated nutritional requirements for this condition/anticipated tx regimen.   GOAL:  Patient will meet greater than or equal to 90% of their needs  MONITOR:  PO intake, Diet advancement, Labs, Supplement acceptance, TF tolerance, Weight trends  ASSESSMENT:  62 y/o male PMHx HTN/HLD, OSA. Abruptly developed dysphagia to meats end of April and presented to ED on 4/30 S/P egd, which did not show mass, but GEJ biopsy returned + for adenocarcinoma. Referred to Oncology. Full workup revealed limited disease and was planned to begin concomitant chemoradiation w/ curative intent. However, developed severe, rapidly progressive dysphagia following EUS. Pt referred to hospital for cath placement and open J tube insertion. RD consulted for TF.    As noted earlier, patient had distention and n/v upon awaking this morning. Had gone to bed tolerating TF. Unable to determine if the TF was ever advanced to goal of 110cc/hr.   Abd Xray showed:Few mildly prominent predominantly fluid-filled loops of small bowel in the LEFT  mid abdomen question ileus.  Per discussion with surgeon, once stable, patient can be discharged on a lower volume regimen initially and can be titrated up over the course of several days at home. Patient probably does not need to meet goal at this time as his regimen kcal/proten was calculated to meet needs while undergoing chemoradiation, but this is not anticipated to begin at least for another week. Both patient and spouse are knowledgable and retain information well. They are quite capable of adjusting TF based on the patients response.   Developed titrated TF regimen that will allow more gradually ease patients body to goal volume. Will only begin at 60cc hr and can advance by 25cc/hr every 2 days, if tolerating. Instructed that if he can not tolerate goal rate, may need to lengthen time patient is on pump.    RD wrote titration schedule on handout for pt/spouse. They noted understanding. Questions answered.   Labs: WDL Meds:Colace, toradol, IVF  Recent Labs  Lab 08/01/17 0900 08/04/17 0609 08/05/17 0519  NA 143 141 144  K 3.9 4.3 4.0  CL 107 102 100*  CO2 27 32 35*  BUN 15 15 17   CREATININE 0.97 0.93 1.00  CALCIUM 9.7 9.3 9.2  MG  --  1.9  --   PHOS  --  3.4  --   GLUCOSE 129* 105* 93   NUTRITION - FOCUSED PHYSICAL EXAM: WDL Diet Order:   Diet Order           Diet clear  liquid Room service appropriate? Yes; Fluid consistency: Thin  Diet effective now         EDUCATION NEEDS:  Education needs have been addressed  Skin:  Surgical incisions to chest/abdomen  Last BM:  6/12  Height:  Ht Readings from Last 1 Encounters:  07/28/17 5\' 10"  (1.778 m)   Weight:  Wt Readings from Last 1 Encounters:  08/05/17 194 lb 3.6 oz (88.1 kg)   Wt Readings from Last 10 Encounters:  08/05/17 194 lb 3.6 oz (88.1 kg)  08/01/17 195 lb 6.4 oz (88.6 kg)  07/28/17 199 lb (90.3 kg)  07/26/17 199 lb (90.3 kg)  07/13/17 200 lb (90.7 kg)  07/04/17 201 lb 12.8 oz (91.5 kg)  06/21/17  205 lb (93 kg)   Ideal Body Weight:  75.45 kg  BMI:  Body mass index is 27.87 kg/m.  Estimated Nutritional Needs: (DURING CONCOMITANT CHEMORADIATION) Kcal:  2400-2650 kcals (27-30 kcal/kg bw) Protein:  115-133g/kg bw Fluid:  >2.4 L/day  Burtis Junes RD, LDN, CNSC Clinical Nutrition Available Tues-Sat via Pager: [phone] 08/05/2017 1:25 PM

## 2017-08-05 NOTE — Progress Notes (Signed)
Nutrition Brief Note  Following up to assess TF tolerance. Patient sitting on edge of bed. Patient reports that was tolerating well when went to bed. Awoke a couple times during night w/ mild distension though fell back asleep. Upon awaking this morning, reports severe distension, n/v and dry heaves. Has been going back/forth to restroom to vomit q 30 min per spouse. He reports not having passed flatus or have BM since surgery. Unable to take oral meds. Ileus? Notified MD via [place]. Will await for her assessment.   Burtis Junes RD, LDN, CNSC Clinical Nutrition Available Tues-Sat via Pager: [phone] 08/05/2017 12:13 PM

## 2017-08-06 ENCOUNTER — Inpatient Hospital Stay (HOSPITAL_COMMUNITY): Payer: BLUE CROSS/BLUE SHIELD

## 2017-08-06 ENCOUNTER — Encounter (HOSPITAL_COMMUNITY): Payer: Self-pay | Admitting: Radiology

## 2017-08-06 LAB — CBC WITH DIFFERENTIAL/PLATELET
Basophils Absolute: 0 10*3/uL (ref 0.0–0.1)
Basophils Relative: 0 %
Eosinophils Absolute: 0 10*3/uL (ref 0.0–0.7)
Eosinophils Relative: 0 %
HEMATOCRIT: 48.2 % (ref 39.0–52.0)
Hemoglobin: 16 g/dL (ref 13.0–17.0)
LYMPHS PCT: 12 %
Lymphs Abs: 1.3 10*3/uL (ref 0.7–4.0)
MCH: 30.5 pg (ref 26.0–34.0)
MCHC: 33.2 g/dL (ref 30.0–36.0)
MCV: 91.8 fL (ref 78.0–100.0)
MONO ABS: 1 10*3/uL (ref 0.1–1.0)
MONOS PCT: 9 %
NEUTROS ABS: 8.5 10*3/uL — AB (ref 1.7–7.7)
Neutrophils Relative %: 79 %
Platelets: 211 10*3/uL (ref 150–400)
RBC: 5.25 MIL/uL (ref 4.22–5.81)
RDW: 12.7 % (ref 11.5–15.5)
WBC: 10.9 10*3/uL — ABNORMAL HIGH (ref 4.0–10.5)

## 2017-08-06 LAB — BASIC METABOLIC PANEL
BUN: 27 mg/dL — ABNORMAL HIGH (ref 6–20)
CHLORIDE: 92 mmol/L — AB (ref 101–111)
Calcium: 9.8 mg/dL (ref 8.9–10.3)
Creatinine, Ser: 1.03 mg/dL (ref 0.61–1.24)
GFR calc Af Amer: >60 mL/min (ref >60)
GFR calc non Af Amer: >60 mL/min (ref >60)
Glucose, Bld: 141 mg/dL — ABNORMAL HIGH (ref 65–99)
POTASSIUM: 3.3 mmol/L — AB (ref 3.5–5.1)
Sodium: 139 mmol/L (ref 135–145)

## 2017-08-06 LAB — MAGNESIUM: MAGNESIUM: 2.1 mg/dL (ref 1.7–2.4)

## 2017-08-06 LAB — PHOSPHORUS: PHOSPHORUS: 4.9 mg/dL — AB (ref 2.5–4.6)

## 2017-08-06 MED ORDER — PHENOL 1.4 % MT LIQD
1.0000 | OROMUCOSAL | Status: DC | PRN
  Filled 2017-08-06: qty 177

## 2017-08-06 MED ORDER — BISACODYL 10 MG RE SUPP
10.0000 mg | Freq: Every day | RECTAL | Status: DC
  Administered 2017-08-06: 10 mg via RECTAL
  Filled 2017-08-06: qty 1

## 2017-08-06 MED ORDER — LACTATED RINGERS IV BOLUS
1000.0000 mL | Freq: Once | INTRAVENOUS | Status: AC
Start: 2017-08-06 — End: 2017-08-06
  Administered 2017-08-06: 1000 mL via INTRAVENOUS

## 2017-08-06 MED ORDER — HYDRALAZINE HCL 20 MG/ML IJ SOLN
10.0000 mg | Freq: Four times a day (QID) | INTRAMUSCULAR | Status: DC | PRN
  Administered 2017-08-10: 10 mg via INTRAVENOUS
  Filled 2017-08-06: qty 1

## 2017-08-06 MED ORDER — FAMOTIDINE IN NACL 20-0.9 MG/50ML-% IV SOLN
20.0000 mg | Freq: Two times a day (BID) | INTRAVENOUS | Status: DC
  Administered 2017-08-06 – 2017-08-10 (×9): 20 mg via INTRAVENOUS
  Filled 2017-08-06 (×10): qty 50

## 2017-08-06 MED ORDER — BISACODYL 10 MG RE SUPP
10.0000 mg | Freq: Two times a day (BID) | RECTAL | Status: DC
  Administered 2017-08-06: 10 mg via RECTAL
  Filled 2017-08-06: qty 1

## 2017-08-06 MED ORDER — ONDANSETRON HCL 4 MG/2ML IJ SOLN
4.0000 mg | Freq: Four times a day (QID) | INTRAMUSCULAR | Status: DC
  Administered 2017-08-06 – 2017-08-10 (×16): 4 mg via INTRAVENOUS
  Filled 2017-08-06 (×16): qty 2

## 2017-08-06 MED ORDER — POTASSIUM CHLORIDE 10 MEQ/100ML IV SOLN
10.0000 meq | INTRAVENOUS | Status: AC
  Administered 2017-08-06 (×3): 10 meq via INTRAVENOUS
  Filled 2017-08-06: qty 100

## 2017-08-06 NOTE — Progress Notes (Signed)
Pt c/o progressively worsening symptoms, (bloating, distention, nausea, etc.). Spoke with Dr. Constance Haw, verbal order to insert NG tube. 12 Fr placed, awaiting x-ray for verification of placement. Reddish/ brown output currently to no suction. Verbal order to also place J-tube to gravity with a Florette Thai catheter bag. Will continue to monitor.

## 2017-08-06 NOTE — Progress Notes (Signed)
Chart reviewed to assess if TF orders needed to be placed.   Pt still suffering from ileus related symptoms. Had NGT placed w/ 500 cc out. MD to continue holding TF.   No orders placed. Please ReConsult for TF management when ready to restart TF. Again, will hold osmolite 1.5 rate at 60cc/hr x12 hrs when this occurs.   Burtis Junes RD, LDN, CNSC Clinical Nutrition Available Tues-Sat via Pager: [phone] 08/06/2017 6:12 PM

## 2017-08-06 NOTE — Progress Notes (Signed)
[facility] Progress Note  3 Days Post-Op  Subjective: Nausea and some vomiting continues. Xray with some fluid filled loops but no dilated/ air fluid levels. Likely has some what of an ileus and also has some constipation with stool in the colon on the Xray. NG placed this AM and is to the distal esophagus, pulling out about 500cc, RN attempted to advance but met resistance. This is likely due to the cancer.  J tube placed to gravity bag. Nothing out.  Reported a small BM with suppository.   Wife anxious and tearful. Has already lost her son and is now dealing with her husband's cancer.  Reassured.   Objective: Vital signs in last 24 hours: Temp:  [97.4 F (36.3 C)-99.3 F (37.4 C)] 98 F (36.7 C) (06/15 0536) Pulse Rate:  [73-92] 92 (06/15 0804) Resp:  [16-20] 16 (06/15 0536) BP: (157-174)/(87-101) 163/101 (06/15 0804) SpO2:  [93 %-95 %] 95 % (06/15 0536) Weight:  [192 lb 10.9 oz (87.4 kg)] 192 lb 10.9 oz (87.4 kg) (06/15 0536) Last BM Date: 08/05/17(very small per patient)  Intake/Output from previous day: 06/14 0701 - 06/15 0700 In: 1033.3 [P.O.:480; I.V.:553.3] Out: -  Intake/Output this shift: Total I/O In: 200 [NG/GT:200] Out: -   General appearance: alert, cooperative and mild distress Resp: normal work breathing GI: soft, tender around incision and some bruising in the LLQ, J tube in place, no leaking Extremities: extremities normal, atraumatic, no cyanosis or edema  Lab Results:  Recent Labs    08/05/17 0519 08/06/17 0623  WBC 8.5 10.9*  HGB 15.4 16.0  HCT 48.1 48.2  PLT 181 211   BMET Recent Labs    08/05/17 0519 08/06/17 0623  NA 144 139  K 4.0 3.3*  CL 100* 92*  CO2 35* TEST NOT PERFORMED, REAGENT NOT AVAILABLE  GLUCOSE 93 141*  BUN 17 27*  CREATININE 1.00 1.03  CALCIUM 9.2 9.8    Studies/Results: Dg Abd 1 View  Result Date: 08/05/2017 CLINICAL DATA:  RIGHT-side lower abdominal pain since this morning, vomiting,  jejunostomy tube placed 2 days ago EXAM: ABDOMEN - 1 VIEW COMPARISON:  None FINDINGS: Nonobstructive bowel gas pattern. A few mildly dilated loops of small bowel are seen in the LEFT mid abdomen, predominantly fluid-filled with minimal air, question ileus. Linear gas collection at the porta hepatis of the liver likely represents a small amount of air, which may be normal 2 days post abdominal surgery. Scattered stool in colon. Bones unremarkable. Skin clips at mid abdomen. IMPRESSION: Few mildly prominent predominantly fluid-filled loops of small bowel in the LEFT mid abdomen question ileus. Potential small amount of free air in the RIGHT upper quadrant, not unexpected 2 days post open abdominal surgery. Electronically Signed   By: Lavonia Dana M.D.   On: 08/05/2017 17:18   Dg Chest Port 1 View  Result Date: 08/06/2017 CLINICAL DATA:  Nasogastric tube placement. EXAM: PORTABLE CHEST 1 VIEW COMPARISON:  08/03/2017. FINDINGS: Right sided Port-A-Cath tip is in the SVC. Nasogastric tube appears to terminate in the distal esophagus. Trachea is midline. Heart size stable. Thoracic aorta is calcified. Mild right basilar platelike atelectasis. Lungs are otherwise clear. Left hemidiaphragm is elevated. Left costophrenic angle was not included on the image. Trace residual pneumoperitoneum. IMPRESSION: 1. Nasogastric tube appears to terminate in the lower esophagus. 2. Right basilar atelectasis. 3. Trace residual pneumoperitoneum, presumably iatrogenic following recent open jejunostomy tube placement. 4.  Aortic atherosclerosis (ICD10-170.0). Electronically Signed   By: Lorin Picket  M.D.   On: 08/06/2017 10:10    Assessment/Plan: Mr. Pinzon is a 62 yo with gastric cancer and dysphagia. With Ileus and J tube feed intolerance.   -PRN for pain, changed to IV  -Scheduled some nausea medication and PRN  -No IV substitute for BP med, will do PRN hdyralazine  -Holding TF for now, and NG going to be replaced. RN attempted  to advance and then had to remove. It was pulling out some fluid so he was having some stuff back up past the cancer.   -Fluid was bloody, which is not unexpected, will schedule some Pepcid BID  -J tube to gravity, will not put anything down for now -Scheduled suppository for now BID -IVF @ 75 ordered, K replaced and Mg and Phos ordered but pending  -WBC 10.9, no fevers, likely just reactive, will monitor  -SCDS, heparin sq    LOS: 3 days    Virl Cagey 08/06/2017

## 2017-08-06 NOTE — Progress Notes (Signed)
Nursing staff had to insert another NG tube due to previous one kinking in back of throat and causing discomfort to the patient. Second NG tube placed, x-ray verified, hooked up to low intermittent suction. Draining well. Will continue to monitor.

## 2017-08-07 LAB — CBC WITH DIFFERENTIAL/PLATELET
Basophils Absolute: 0 10*3/uL (ref 0.0–0.1)
Basophils Relative: 0 %
EOS ABS: 0.2 10*3/uL (ref 0.0–0.7)
Eosinophils Relative: 3 %
HCT: 47.2 % (ref 39.0–52.0)
Hemoglobin: 14.9 g/dL (ref 13.0–17.0)
Lymphocytes Relative: 25 %
Lymphs Abs: 1.9 10*3/uL (ref 0.7–4.0)
MCH: 30.1 pg (ref 26.0–34.0)
MCHC: 31.6 g/dL (ref 30.0–36.0)
MCV: 95.4 fL (ref 78.0–100.0)
MONO ABS: 0.8 10*3/uL (ref 0.1–1.0)
MONOS PCT: 11 %
Neutro Abs: 4.6 10*3/uL (ref 1.7–7.7)
Neutrophils Relative %: 61 %
Platelets: 205 10*3/uL (ref 150–400)
RBC: 4.95 MIL/uL (ref 4.22–5.81)
RDW: 13.1 % (ref 11.5–15.5)
WBC: 7.5 10*3/uL (ref 4.0–10.5)

## 2017-08-07 LAB — BASIC METABOLIC PANEL
Anion gap: 12 (ref 5–15)
BUN: 35 mg/dL — ABNORMAL HIGH (ref 6–20)
CO2: 36 mmol/L — ABNORMAL HIGH (ref 22–32)
CREATININE: 1.39 mg/dL — AB (ref 0.61–1.24)
Calcium: 9.1 mg/dL (ref 8.9–10.3)
Chloride: 94 mmol/L — ABNORMAL LOW (ref 101–111)
GFR calc Af Amer: >60 mL/min (ref >60)
GFR, EST NON AFRICAN AMERICAN: 54 mL/min — AB (ref >60)
Glucose, Bld: 101 mg/dL — ABNORMAL HIGH (ref 65–99)
Potassium: 3.9 mmol/L (ref 3.5–5.1)
SODIUM: 142 mmol/L (ref 135–145)

## 2017-08-07 LAB — PHOSPHORUS: Phosphorus: 4.4 mg/dL (ref 2.5–4.6)

## 2017-08-07 LAB — MAGNESIUM: MAGNESIUM: 2.3 mg/dL (ref 1.7–2.4)

## 2017-08-07 LAB — PREALBUMIN: PREALBUMIN: 22.5 mg/dL (ref 18–38)

## 2017-08-07 MED ORDER — FREE WATER
50.0000 mL | Freq: Four times a day (QID) | Status: DC
  Administered 2017-08-07 – 2017-08-08 (×4): 50 mL

## 2017-08-07 MED ORDER — DOCUSATE SODIUM 50 MG/5ML PO LIQD
100.0000 mg | Freq: Two times a day (BID) | ORAL | Status: DC
  Administered 2017-08-07 – 2017-08-10 (×6): 100 mg via ORAL
  Filled 2017-08-07 (×11): qty 10

## 2017-08-07 MED ORDER — BISACODYL 10 MG RE SUPP: 10.0000 mg | Freq: Every day | RECTAL | Status: DC | PRN

## 2017-08-07 MED ORDER — LACTATED RINGERS IV BOLUS
500.0000 mL | Freq: Once | INTRAVENOUS | Status: AC
  Administered 2017-08-07: 500 mL via INTRAVENOUS

## 2017-08-07 NOTE — Progress Notes (Addendum)
Work note on chart.   Patient to get oncology to do FMLA given that they are managing his care following this J tube placement.   Lytes looking ok. Somewhat dehydrated from the NG output,  LR 500 bolus ordered in addition to fluids.  Curlene Labrum, MD [facility] [address] [place],  [address] [phone] (office)

## 2017-08-07 NOTE — Progress Notes (Signed)
[facility] Progress Note  4 Days Post-Op  Subjective: 1 small BM and some flatus reported. Feels less nauseated. NG out but put out 4L recorded. Still hiccupping some.   Objective: Vital signs in last 24 hours: Temp:  [98.2 F (36.8 C)-98.3 F (36.8 C)] 98.3 F (36.8 C) (06/16 0540) Pulse Rate:  [73-80] 74 (06/16 0540) Resp:  [16-18] 16 (06/16 0540) BP: (110-153)/(67-97) 125/79 (06/16 0540) SpO2:  [91 %-97 %] 96 % (06/16 0540) Weight:  [186 lb 6.4 oz (84.6 kg)] 186 lb 6.4 oz (84.6 kg) (06/16 0540) Last BM Date: 08/06/17  Intake/Output from previous day: 06/15 0701 - 06/16 0700 In: 2706.4 [I.V.:1388.3; NG/GT:200; IV Piggyback:1118.1] Out: 3790 [Urine:250; Emesis/NG output:4810] Intake/Output this shift: No intake/output data recorded.  General appearance: alert, cooperative and no distress Resp: normal work breathing GI: soft, less distended, staple line c/d/i with no erythema or drainage Port site less bruised, no erythema or drainage   Lab Results:  Recent Labs    08/06/17 0623 08/07/17 0618  WBC 10.9* 7.5  HGB 16.0 14.9  HCT 48.2 47.2  PLT 211 205   BMET Recent Labs    08/05/17 0519 08/06/17 0623  NA 144 139  K 4.0 3.3*  CL 100* 92*  CO2 35* TEST NOT PERFORMED, REAGENT NOT AVAILABLE  GLUCOSE 93 141*  BUN 17 27*  CREATININE 1.00 1.03  CALCIUM 9.2 9.8   Studies/Results: Dg Abd 1 View  Result Date: 08/05/2017 CLINICAL DATA:  RIGHT-side lower abdominal pain since this morning, vomiting, jejunostomy tube placed 2 days ago EXAM: ABDOMEN - 1 VIEW COMPARISON:  None FINDINGS: Nonobstructive bowel gas pattern. A few mildly dilated loops of small bowel are seen in the LEFT mid abdomen, predominantly fluid-filled with minimal air, question ileus. Linear gas collection at the porta hepatis of the liver likely represents a small amount of air, which may be normal 2 days post abdominal surgery. Scattered stool in colon. Bones unremarkable. Skin clips  at mid abdomen. IMPRESSION: Few mildly prominent predominantly fluid-filled loops of small bowel in the LEFT mid abdomen question ileus. Potential small amount of free air in the RIGHT upper quadrant, not unexpected 2 days post open abdominal surgery. Electronically Signed   By: Lavonia Dana M.D.   On: 08/05/2017 17:18   Dg Chest Port 1 View  Result Date: 08/06/2017 CLINICAL DATA:  NG tube placement EXAM: PORTABLE CHEST 1 VIEW COMPARISON:  08/06/2017 FINDINGS: Right Port-A-Cath and NG tube remain in place, unchanged. Bibasilar atelectasis or infiltrates, right greater than left, stable since prior study. Heart is borderline in size. No effusions or acute bony abnormality. IMPRESSION: Bibasilar atelectasis or infiltrates, right greater than left, stable. Electronically Signed   By: Rolm Baptise M.D.   On: 08/06/2017 13:21   Dg Chest Port 1 View  Result Date: 08/06/2017 CLINICAL DATA:  Nasogastric tube placement. EXAM: PORTABLE CHEST 1 VIEW COMPARISON:  08/03/2017. FINDINGS: Right sided Port-A-Cath tip is in the SVC. Nasogastric tube appears to terminate in the distal esophagus. Trachea is midline. Heart size stable. Thoracic aorta is calcified. Mild right basilar platelike atelectasis. Lungs are otherwise clear. Left hemidiaphragm is elevated. Left costophrenic angle was not included on the image. Trace residual pneumoperitoneum. IMPRESSION: 1. Nasogastric tube appears to terminate in the lower esophagus. 2. Right basilar atelectasis. 3. Trace residual pneumoperitoneum, presumably iatrogenic following recent open jejunostomy tube placement. 4.  Aortic atherosclerosis (ICD10-170.0). Electronically Signed   By: Lorin Picket M.D.   On: 08/06/2017 10:10  Assessment/Plan: Mr. Ryback is a 62 yo with gastric cancer and dysphagia. Ileus starting to improve. NG out and had BM.  -PRN for pain -Scheduled some nausea medication continued today  -No IV substitute for BP -Holding TF for now until have more  bowel function -Ok to have sips, patient educated on taking in and refraining based on hiccups, bloating, etc  -J tube clamped and will plan to flush 50 q6 hours  -Colace added back, suppository PRN  -IVF @ 75 ordered, lytes pending  -WBC wnl, no fevers, no signs of infection, d/c CBC  -SCDS, heparin sq    LOS: 4 days    Virl Cagey 08/07/2017

## 2017-08-07 NOTE — Progress Notes (Signed)
Denies nausea.  Abd soft, nondistended.  Passing gas and stated earlier bm was about 1/4 cup.  J tube draining small amount of dark fluid.

## 2017-08-07 NOTE — Progress Notes (Signed)
Walked again with family about 500 feet.    Also,  May have sips of coffee per Dr. Constance Haw

## 2017-08-07 NOTE — Progress Notes (Signed)
Patient pulled his NG tube out when trying to go to the bathroom for a BM.  Patient did not want to have NG tube replaced, patient was asked several times.  Patient stated he felt better and wanted to wait until he spoke with the doctor before having tube replaced.  Will contact Dr. Constance Haw.

## 2017-08-07 NOTE — Progress Notes (Signed)
Dr. Constance Haw notified regarding NG tube being out.

## 2017-08-07 NOTE — Progress Notes (Signed)
Denies nausea, active BS now and abd nondistented.  Walked at steady pace in hallway for about 500 feet.  Encouraged to deep breath and cough

## 2017-08-08 ENCOUNTER — Ambulatory Visit (HOSPITAL_COMMUNITY): Payer: BLUE CROSS/BLUE SHIELD

## 2017-08-08 ENCOUNTER — Ambulatory Visit (HOSPITAL_COMMUNITY): Payer: BLUE CROSS/BLUE SHIELD | Admitting: Hematology

## 2017-08-08 ENCOUNTER — Other Ambulatory Visit (HOSPITAL_COMMUNITY): Payer: BLUE CROSS/BLUE SHIELD

## 2017-08-08 ENCOUNTER — Inpatient Hospital Stay (HOSPITAL_COMMUNITY): Payer: BLUE CROSS/BLUE SHIELD

## 2017-08-08 LAB — BASIC METABOLIC PANEL
Anion gap: 13 (ref 5–15)
BUN: 38 mg/dL — ABNORMAL HIGH (ref 6–20)
CHLORIDE: 91 mmol/L — AB (ref 101–111)
CO2: 37 mmol/L — AB (ref 22–32)
CREATININE: 1.43 mg/dL — AB (ref 0.61–1.24)
Calcium: 9.2 mg/dL (ref 8.9–10.3)
GFR calc non Af Amer: 52 mL/min — ABNORMAL LOW (ref >60)
Glucose, Bld: 134 mg/dL — ABNORMAL HIGH (ref 65–99)
Potassium: 3.8 mmol/L (ref 3.5–5.1)
Sodium: 141 mmol/L (ref 135–145)

## 2017-08-08 LAB — MAGNESIUM: Magnesium: 2.5 mg/dL — ABNORMAL HIGH (ref 1.7–2.4)

## 2017-08-08 LAB — PHOSPHORUS: PHOSPHORUS: 4.8 mg/dL — AB (ref 2.5–4.6)

## 2017-08-08 MED ORDER — MILK AND MOLASSES ENEMA
1.0000 | Freq: Once | RECTAL | Status: AC
  Administered 2017-08-08: 250 mL via RECTAL

## 2017-08-08 MED ORDER — LACTATED RINGERS IV BOLUS
1000.0000 mL | Freq: Once | INTRAVENOUS | Status: AC
  Administered 2017-08-08: 1000 mL via INTRAVENOUS

## 2017-08-08 NOTE — Care Management (Signed)
CM contacted by pt's wife. She has spoken with [facility], they are aware pt has not yet been DC'd from hospital. CM will fax DC summary and notify [facility] when pt is ready for DC.

## 2017-08-08 NOTE — Progress Notes (Signed)
[facility]  Continued ileus on KUB. Enema X 2 today. Will continue to hold feeds. Small amount of free air unchanged and post op. Exam has remained benign/ appropriate. No fevers or hemodynamic issues. No wbc that would make me worried about leak or perforation.   Curlene Labrum, MD [facility] [address] [place], [place] [address] [phone] (office)

## 2017-08-08 NOTE — Progress Notes (Signed)
[facility] Progress Note  5 Days Post-Op  Subjective: Still with little as far as BM or flatus. Feels more distended. Only did sips yesterday, maybe took in <1 L total.  J tube was clamped.  No vomiting but having nausea.   Objective: Vital signs in last 24 hours: Temp:  [98.2 F (36.8 C)-98.8 F (37.1 C)] 98.2 F (36.8 C) (06/17 1510) Pulse Rate:  [86-96] 88 (06/17 1510) Resp:  [15-18] 18 (06/17 1510) BP: (119-154)/(76-92) 154/92 (06/17 1510) SpO2:  [92 %-95 %] 94 % (06/17 1510) Last BM Date: 08/07/17  Intake/Output from previous day: 06/16 0701 - 06/17 0700 In: 100 [NG/GT:100] Out: -  Intake/Output this shift: No intake/output data recorded.  General appearance: alert, cooperative and no distress Resp: normal work breathing GI: soft, tender at incision and J tube, J tube pulled 7cc of water out, placed 5cc water back, no leaking, incision c/d/i with staples, mildly disetnded Port site bruising evolving, no erythema or drainage   Lab Results:  Recent Labs    08/06/17 0623 08/07/17 0618  WBC 10.9* 7.5  HGB 16.0 14.9  HCT 48.2 47.2  PLT 211 205   BMET Recent Labs    08/07/17 0618 08/08/17 0703  NA 142 141  K 3.9 3.8  CL 94* 91*  CO2 36* 37*  GLUCOSE 101* 134*  BUN 35* 38*  CREATININE 1.39* 1.43*  CALCIUM 9.1 9.2    Assessment/Plan: Phillip Lane is a 62 yo with gastric cancer and dysphagia. S/p Port a catheter and Jejunostomy feeding tube placement. Patient with Ileus/ constipation.  -PRN for pain -Nausea med  -PRN IV hydralazine  -Ok to have sips, J tube to gravity bag  -Enemas today to see if helps with stool burden in colon -LR 1L bolus, and IVF increased to 100cc due to rising BUN/Cr  -No fevers, no signs of infection  -SCDS, heparin sq -Will repeat KUB, 5cc to the balloon (down from 7cc) do not think this is causing occlusion, but testing anyway  -Possibly do a SBFT tomorrow through J tube?     LOS: 5 days    Phillip Lane 08/08/2017

## 2017-08-09 ENCOUNTER — Inpatient Hospital Stay (HOSPITAL_COMMUNITY): Payer: BLUE CROSS/BLUE SHIELD

## 2017-08-09 ENCOUNTER — Encounter (HOSPITAL_COMMUNITY): Payer: Self-pay | Admitting: Radiology

## 2017-08-09 ENCOUNTER — Other Ambulatory Visit (HOSPITAL_COMMUNITY): Payer: BLUE CROSS/BLUE SHIELD

## 2017-08-09 LAB — BASIC METABOLIC PANEL
ANION GAP: 11 (ref 5–15)
BUN: 38 mg/dL — ABNORMAL HIGH (ref 6–20)
CHLORIDE: 94 mmol/L — AB (ref 101–111)
CO2: 36 mmol/L — ABNORMAL HIGH (ref 22–32)
Calcium: 8.8 mg/dL — ABNORMAL LOW (ref 8.9–10.3)
Creatinine, Ser: 1.33 mg/dL — ABNORMAL HIGH (ref 0.61–1.24)
GFR calc non Af Amer: 57 mL/min — ABNORMAL LOW (ref >60)
Glucose, Bld: 130 mg/dL — ABNORMAL HIGH (ref 65–99)
POTASSIUM: 3.7 mmol/L (ref 3.5–5.1)
SODIUM: 141 mmol/L (ref 135–145)

## 2017-08-09 LAB — MAGNESIUM: MAGNESIUM: 2.5 mg/dL — AB (ref 1.7–2.4)

## 2017-08-09 LAB — PHOSPHORUS: PHOSPHORUS: 4 mg/dL (ref 2.5–4.6)

## 2017-08-09 MED ORDER — LACTATED RINGERS IV BOLUS
1000.0000 mL | Freq: Once | INTRAVENOUS | Status: AC
  Administered 2017-08-09: 1000 mL via INTRAVENOUS

## 2017-08-09 MED ORDER — IOPAMIDOL (ISOVUE-300) INJECTION 61%
100.0000 mL | Freq: Once | INTRAVENOUS | Status: AC | PRN
  Administered 2017-08-09: 100 mL via INTRAVENOUS

## 2017-08-09 MED ORDER — MILK AND MOLASSES ENEMA
1.0000 | Freq: Once | RECTAL | Status: AC
  Administered 2017-08-09: 250 mL via RECTAL

## 2017-08-09 MED ORDER — IOPAMIDOL (ISOVUE-300) INJECTION 61%: 15.0000 mL | Freq: Once | INTRAVENOUS | Status: DC | PRN

## 2017-08-09 MED ORDER — POTASSIUM CHLORIDE 10 MEQ/100ML IV SOLN
10.0000 meq | INTRAVENOUS | Status: AC
  Administered 2017-08-09 (×2): 10 meq via INTRAVENOUS
  Filled 2017-08-09 (×2): qty 100

## 2017-08-09 MED ORDER — POTASSIUM CHLORIDE 10 MEQ/100ML IV SOLN
10.0000 meq | INTRAVENOUS | Status: AC
  Administered 2017-08-09: 10 meq via INTRAVENOUS
  Filled 2017-08-09: qty 100

## 2017-08-09 NOTE — Progress Notes (Signed)
[facility] Progress Note  6 Days Post-Op  Subjective: Having some nausea and bloating when came into room. CT scan with obstruction from the Jejunostomy balloon. Free air but no free fluid or extravasation on the CT scan.  Abdomen remains minimally tender around the incision.  Had a small BM and is voiding. Was given a fluid bolus earlier.   Objective: Vital signs in last 24 hours: Temp:  [98.9 F (37.2 C)-99.2 F (37.3 C)] 98.9 F (37.2 C) (06/18 1620) Pulse Rate:  [78-92] 85 (06/18 1620) Resp:  [18] 18 (06/18 1620) BP: (143-148)/(79-86) 143/81 (06/18 1620) SpO2:  [92 %-96 %] 95 % (06/18 1620) Last BM Date: 08/09/17(very small per patient)  Intake/Output from previous day: No intake/output data recorded. Intake/Output this shift: Total I/O In: 0  Out: 250 [Urine:250]  General appearance: alert, cooperative and no distress Resp: normal work breathing GI: soft, mildly distended, tender around the incision, no rebound or guarding, soft Extremities: extremities normal, atraumatic, no cyanosis or edema J tube site intact, no leaking, minimal fluid in J tube   Lab Results:  Recent Labs    08/07/17 0618  WBC 7.5  HGB 14.9  HCT 47.2  PLT 205   BMET Recent Labs    08/08/17 0703 08/09/17 0458  NA 141 141  K 3.8 3.7  CL 91* 94*  CO2 37* 36*  GLUCOSE 134* 130*  BUN 38* 38*  CREATININE 1.43* 1.33*  CALCIUM 9.2 8.8*   Personally reviewed CT- J tube in place, balloon occluding the lumen, no free fluid see or extravasation, tube appears abutting the abdominal wall, stomach very distended and small bowel proximal to balloon  Studies/Results: Dg Abd 1 View  Result Date: 08/08/2017 CLINICAL DATA:  Follow-up ileus EXAM: ABDOMEN - 1 VIEW COMPARISON:  Abdominal radiographs of August 05, 2017 FINDINGS: The open jejunostomy tube appears to terminate to the right of the body of L5. There are loops of mildly distended fluid and gas-filled small bowel in the mid  abdomen. The colonic stool burden is not excessive. No significant distention of the colon is observed. There is a small amount of gas within the rectum. There is some low lucency likely reflecting free extraluminal air in the right upper quadrant which may be secondary to the patient's recent open jejunostomy tube placement. IMPRESSION: Persistent mild small bowel ileus. Small amount of persistent free extraluminal gas that may be postoperative but correlation with clinical and laboratory values is needed in an effort to judge whether an occult bowel leak or perforation could be present. Electronically Signed   By: David  Martinique M.D.   On: 08/08/2017 15:19   Ct Abdomen Pelvis W Contrast  Result Date: 08/09/2017 CLINICAL DATA:  Gastric carcinoma with abdominal pain. EXAM: CT ABDOMEN AND PELVIS WITH CONTRAST TECHNIQUE: Multidetector CT imaging of the abdomen and pelvis was performed using the standard protocol following bolus administration of intravenous contrast. Oral contrast administered through jejunostomy catheter. CONTRAST:  170mL ISOVUE-300 IOPAMIDOL (ISOVUE-300) INJECTION 61% COMPARISON:  None. FINDINGS: Lower chest: There is bibasilar atelectatic change. Fluid is noted in the distal esophagus. Hepatobiliary: No focal liver lesions are evident. Gallbladder wall is not appreciably thickened. There is no biliary duct dilatation. Pancreas: No pancreatic mass or inflammatory focus. Spleen: No splenic lesions are evident. Adrenals/Urinary Tract: Adrenals appear normal bilaterally. There is no renal mass or hydronephrosis on either side. There is no renal or ureteral calculus on either side. Urinary bladder is midline with wall thickness within normal limits.  Stomach/Bowel: There are scattered foci of pneumoperitoneum. No portal venous air evident. There is marked distention of the stomach with fluid and to a lesser extent air. There is diffuse dilatation of the jejunum and proximal aspect. There is a  jejunostomy tube placed near the junction of the proximal and mid thirds of the jejunum. Transition zone is seen at the level of the balloon of the jejunostomy catheter. No appreciable bowel dilatation is noted distal to the jejunostomy catheter. The catheter is located within the jejunum without oral contrast extravasation. There are multiple sigmoid and descending colonic diverticula without diverticulitis. Moderate stool is seen in the colon. Vascular/Lymphatic: There is aortic atherosclerosis. There is also atherosclerotic calcification in the iliac arteries. There is dilatation of the distal aorta with a maximum transverse diameter of 3.8 x 3.4 cm. This dilatation arises inferior to the renal arteries and terminates at the bifurcation. There is no periaortic fluid. Major mesenteric arterial vessels are patent. There is no appreciable adenopathy in the abdomen or pelvis. Reproductive: Prostate and seminal vesicles are normal in size and contour. No pelvic mass evident. There is a sizable scrotal hydrocele on the right with a much smaller scrotal hydrocele on the left. Other: Appendix appears normal. No evident abscess or ascites in the abdomen or pelvis. Musculoskeletal: There is mild anterior wedging of the T12 vertebral body. There are no blastic or lytic bone lesions. No intramuscular lesions. Air is seen in the right abdominal wall and umbilical regions, likely due to recent jejunostomy placement. IMPRESSION: 1. Bowel obstruction near the junction of the proximal and mid portions of the jejunum at the site of the jejunostomy catheter balloon. Concern that jejunostomy balloon is contributing to the bowel obstruction given the proximity. 2. Foci of pneumoperitoneum. Question pneumoperitoneum secondary to recent jejunostomy catheter placement. The possibility of viscus perforation cannot be entirely excluded. In this regard, close clinical and imaging assessment advised. 3.  No contrast extravasation from  bowel. 4. Extensive left-sided colonic diverticulosis without diverticulitis. 5. Air in the umbilical region and right anterior abdominal wall are likely of postoperative etiology. 6. Fluid in the distal esophagus is likely due to the bowel obstruction. Note that the stomach is markedly distended with fluid. 7. Bilateral scrotal hydroceles, larger on the right than on the left. 8. Extensive aortoiliac atherosclerosis. Dilatation of the distal aorta measuring 3.8 x 3.4 cm. Recommend followup by ultrasound in 2 years. This recommendation follows ACR consensus guidelines: White Paper of the ACR Incidental Findings Committee II on Vascular Findings. J Am Coll Radiol 2013; 10:789-794. Aortic Atherosclerosis (ICD10-I70.0). Aortic aneurysm NOS (ICD10-I71.9). Critical Value/emergent results were called by telephone at the time of interpretation on 08/09/2017 at 11:41 am to Dr. Curlene Labrum , who verbally acknowledged these results. Electronically Signed   By: Lowella Grip III M.D.   On: 08/09/2017 11:41     Assessment/Plan: Mr. Lasky is a 62 yo with gastric cancer and progressive dysphagia,  s/p Port a catheter and Jejunostomy feeding tube placement. Patient with some degree of obstruction proximal due to the balloon of the jejunostomy tube also likely some degree of ileus distal due to no Bms for multiple days after surgery. All water removed from balloon today.   -PRN for pain -Nausea med  -PRN IV hydralazine  -NPO, J tube can go back to clamping  -Patient requested another enema  -LR bolus earlier, BMP in AM  -No fevers, no signs of infection, Will repeat CBC in AM  -SCDS, heparin sq -Hopefully with  balloon down patient will feel better and distention will improve and can resume feeds tomorrow    LOS: 6 days    Virl Cagey 08/09/2017

## 2017-08-09 NOTE — Progress Notes (Signed)
Per daytime nurse, patient walked around the floor several times > 100 feet and tolerated ambulation well.

## 2017-08-09 NOTE — Progress Notes (Signed)
Late entry. Patient c/o being unable to void this afternoon around 1345. Bladder scan completed, urine volume of 271. Pt denied any urge to void, pain or discomfort. Also reported "feeling bloated like I'm going to pop." abdominal distention noted, wife at bedside states "he's more distended today." Dr. Constance Haw on unit rounding, notified of patient complaints, bladder scan volume and distension. Pt seen by Dr.Bridges. Nursing discussed with MD, states patient was able to void while she was present. Nursing to continue monitoring. Donavan Foil, RN

## 2017-08-09 NOTE — Progress Notes (Signed)
Late entry  Patient reports one small bowel movement last night following second enema, unseen by RN. Patient complaining of pain and nausea, no vomiting.

## 2017-08-09 NOTE — Progress Notes (Addendum)
[facility]  Patient with continued small BM but feeling nauseated and distended. K replaced. Ordering CT a/p with IV and oral contrast.  To RN: Oral Contrast through J tube. Get 300-500cc in the J tube, whatever the patient can tolerate.  Do it over a period of time and put in 100cc at a time and keep the J tube clamped.    Discussed case with Dr. Thornton Papas, and agrees CT with contrast and J tube contrast best. Giving another bolus of LR prior to CT scan. Will see patient after lunch.   Curlene Labrum, MD [facility] [address] [place], [place] [address] [phone] (office)

## 2017-08-09 NOTE — Care Management Note (Signed)
Case Management Note  Patient Details  Name: Phillip Lane MRN: [id number] Date of Birth: 1955/09/25  If discussed at [place] Length of Stay Meetings, dates discussed:  08/09/2017  Additional Comments:  Sherald Barge, RN 08/09/2017, 12:11 PM

## 2017-08-10 DIAGNOSIS — E43 Unspecified severe protein-calorie malnutrition: Secondary | ICD-10-CM

## 2017-08-10 LAB — BASIC METABOLIC PANEL
Anion gap: 8 (ref 5–15)
BUN: 24 mg/dL — AB (ref 6–20)
CO2: 34 mmol/L — ABNORMAL HIGH (ref 22–32)
Calcium: 8.5 mg/dL — ABNORMAL LOW (ref 8.9–10.3)
Chloride: 100 mmol/L — ABNORMAL LOW (ref 101–111)
Creatinine, Ser: 1.04 mg/dL (ref 0.61–1.24)
GFR calc Af Amer: >60 mL/min (ref >60)
Glucose, Bld: 106 mg/dL — ABNORMAL HIGH (ref 65–99)
POTASSIUM: 4.3 mmol/L (ref 3.5–5.1)
SODIUM: 142 mmol/L (ref 135–145)

## 2017-08-10 LAB — MAGNESIUM: Magnesium: 2.3 mg/dL (ref 1.7–2.4)

## 2017-08-10 LAB — CBC WITH DIFFERENTIAL/PLATELET
BASOS ABS: 0 10*3/uL (ref 0.0–0.1)
BASOS PCT: 0 %
EOS PCT: 2 %
Eosinophils Absolute: 0.1 10*3/uL (ref 0.0–0.7)
HCT: 43.8 % (ref 39.0–52.0)
Hemoglobin: 14.4 g/dL (ref 13.0–17.0)
LYMPHS PCT: 21 %
Lymphs Abs: 1.4 10*3/uL (ref 0.7–4.0)
MCH: 31.3 pg (ref 26.0–34.0)
MCHC: 32.9 g/dL (ref 30.0–36.0)
MCV: 95.2 fL (ref 78.0–100.0)
Monocytes Absolute: 1.3 10*3/uL — ABNORMAL HIGH (ref 0.1–1.0)
Monocytes Relative: 19 %
Neutro Abs: 4 10*3/uL (ref 1.7–7.7)
Neutrophils Relative %: 58 %
PLATELETS: 217 10*3/uL (ref 150–400)
RBC: 4.6 MIL/uL (ref 4.22–5.81)
RDW: 12.9 % (ref 11.5–15.5)
WBC: 6.8 10*3/uL (ref 4.0–10.5)

## 2017-08-10 LAB — PHOSPHORUS: PHOSPHORUS: 2.8 mg/dL (ref 2.5–4.6)

## 2017-08-10 MED ORDER — OSMOLITE 1.5 CAL PO LIQD: 1000.0000 mL | ORAL | Status: DC

## 2017-08-10 MED ORDER — SUCRALFATE 1 GM/10ML PO SUSP
1.0000 g | Freq: Three times a day (TID) | ORAL | Status: DC
  Administered 2017-08-10 – 2017-08-11 (×5): 1 g via ORAL
  Filled 2017-08-10 (×5): qty 10

## 2017-08-10 MED ORDER — FAMOTIDINE 40 MG/5ML PO SUSR: 40.0000 mg | Freq: Two times a day (BID) | ORAL | Status: DC

## 2017-08-10 MED ORDER — METOCLOPRAMIDE HCL 5 MG/5ML PO SOLN
5.0000 mg | Freq: Three times a day (TID) | ORAL | Status: DC
  Administered 2017-08-10 – 2017-08-11 (×4): 5 mg
  Filled 2017-08-10 (×4): qty 10

## 2017-08-10 MED ORDER — FAMOTIDINE 40 MG/5ML PO SUSR
40.0000 mg | Freq: Two times a day (BID) | ORAL | Status: DC
  Filled 2017-08-10 (×4): qty 5

## 2017-08-10 MED ORDER — FAMOTIDINE 20 MG PO TABS: 20.0000 mg | ORAL_TABLET | Freq: Two times a day (BID) | ORAL | Status: DC

## 2017-08-10 MED ORDER — VERAPAMIL HCL 80 MG PO TABS
80.0000 mg | ORAL_TABLET | Freq: Three times a day (TID) | ORAL | Status: DC
  Administered 2017-08-11 (×2): 80 mg
  Filled 2017-08-10 (×2): qty 1

## 2017-08-10 MED ORDER — OSMOLITE 1.5 CAL PO LIQD
1000.0000 mL | ORAL | Status: DC
  Administered 2017-08-11: 1000 mL
  Filled 2017-08-10: qty 1000

## 2017-08-10 MED ORDER — OXYCODONE HCL 20 MG/ML PO CONC
5.0000 mg | ORAL | Status: DC | PRN
Start: 2017-08-10 — End: 2017-08-11

## 2017-08-10 MED ORDER — ONDANSETRON 4 MG PO TBDP
4.0000 mg | ORAL_TABLET | Freq: Four times a day (QID) | ORAL | Status: DC
  Administered 2017-08-10 – 2017-08-11 (×4): 4 mg via ORAL
  Filled 2017-08-10 (×5): qty 1

## 2017-08-10 MED ORDER — FAMOTIDINE 40 MG/5ML PO SUSR
20.0000 mg | Freq: Two times a day (BID) | ORAL | Status: DC
  Administered 2017-08-11: 20 mg
  Filled 2017-08-10 (×3): qty 2.5

## 2017-08-10 MED ORDER — FAMOTIDINE 40 MG/5ML PO SUSR
20.0000 mg | Freq: Two times a day (BID) | ORAL | Status: DC
  Filled 2017-08-10 (×4): qty 2.5

## 2017-08-10 MED ORDER — OXYCODONE HCL 20 MG/ML PO CONC
5.0000 mg | ORAL | Status: DC | PRN
Start: 2017-08-10 — End: 2017-08-10

## 2017-08-10 MED ORDER — OXYCODONE HCL 20 MG/ML PO CONC: 5.0000 mg | ORAL | Status: DC | PRN

## 2017-08-10 MED ORDER — OSMOLITE 1.5 CAL PO LIQD
1000.0000 mL | ORAL | Status: DC
  Administered 2017-08-10: 1000 mL
  Filled 2017-08-10 (×2): qty 1000

## 2017-08-10 MED ORDER — FREE WATER
100.0000 mL | Status: DC
  Administered 2017-08-10 – 2017-08-11 (×6): 100 mL

## 2017-08-10 MED ORDER — ENSURE ENLIVE PO LIQD
237.0000 mL | Freq: Two times a day (BID) | ORAL | Status: DC
  Administered 2017-08-10: 237 mL via ORAL

## 2017-08-10 MED ORDER — VERAPAMIL HCL ER 200 MG PO CP24
200.0000 mg | ORAL_CAPSULE | Freq: Every day | ORAL | Status: DC
  Administered 2017-08-10: 200 mg via ORAL

## 2017-08-10 MED ORDER — SUCRALFATE 1 GM/10ML PO SUSP: 1.0000 g | Freq: Three times a day (TID) | ORAL | Status: DC

## 2017-08-10 MED ORDER — VERAPAMIL HCL ER 200 MG PO CP24: 200.0000 mg | ORAL_CAPSULE | Freq: Every day | ORAL | Status: DC

## 2017-08-10 MED ORDER — FAMOTIDINE IN NACL 20-0.9 MG/50ML-% IV SOLN
20.0000 mg | Freq: Once | INTRAVENOUS | Status: AC
  Administered 2017-08-10: 20 mg via INTRAVENOUS
  Filled 2017-08-10 (×2): qty 50

## 2017-08-10 MED ORDER — DOCUSATE SODIUM 50 MG/5ML PO LIQD
100.0000 mg | Freq: Two times a day (BID) | ORAL | Status: DC
  Administered 2017-08-10 – 2017-08-11 (×2): 100 mg
  Filled 2017-08-10 (×4): qty 10

## 2017-08-10 NOTE — Progress Notes (Signed)
Nutrition Follow-up  DOCUMENTATION CODES:  Severe malnutrition in context of acute illness/injury  INTERVENTION:   Restarted TF at 30 cc/hr. Will hold at rate until surgeon evaluates. Increase to 60 when able.   Tentative D/C TF plan. Gradually titrated TF:  Osmolite 1.5 '@60cc'  x 12 hrs. He is to advance rate by 25 cc/hr Q2 days, as tolerating, to goal of 110 cc/hr. Educated pt/spouse.   Goal rate, which would be reached in 4 days, provides 1980 kcals (~81% of est. needs), 83g pro (75%), 1006 ml fluid.   Once TF at goal, antcipate will be able to meet remainder of needs w/ 2-3 Ensures/day-will need to reevaluate if patient unable to take oral intake  He will need flush with 200 cc q4 hrs to meet estimated fluid needs.    RD to follow outpatient and regimen can be titrated based on tolerance, level of PO intake and weight.    NUTRITION DIAGNOSIS:  Severe Malnutrition related to acute illness, altered GI function(Ileus/SBO) as evidenced by intake of </= 50% of estimated needs for >/= to 5 days and loss of >4% bw x ~10 days.   NEW  GOAL:  Patient will meet greater than or equal to 90% of their needs   Not met  MONITOR:  Diet advancement, Labs, Weight trends, I & O's, TF tolerance  ASSESSMENT:  62 y/o male PMHx HTN/HLD, OSA. Abruptly developed dysphagia to meats end of April and presented to ED on 4/30 S/P egd, which did not show mass, but GEJ biopsy returned + for adenocarcinoma. Referred to Oncology. Full workup revealed limited disease and was planned to begin concomitant chemoradiation w/ curative intent. However, developed severe, rapidly progressive dysphagia following EUS. Pt referred to hospital for cath placement and open J tube insertion. RD consulted for TF.    Interval Hx:  6/13: TF initiated that evening. Tolerating up to went to bedf 6/14: Patient woke up w/ severe distension and n/v. Tf held 6/15. NGT placed. 4 L removed. 6/16: NGT removed. Feels better. 6/17:  Recurrent distension/nausea. Abd xray shows persistent ileus 6/18: CT w/ contrast shows bowl obstruction 2/2 jejunostomy balloon. Water removed from balloon. Feels better afterwards  RD consulted for reinitiation of TF via Jejunostomy. Start at 30 cc/hr per MD.   Patient alone on visit today. TF had been infusing @ 30 cc for 3 hrs on RD arrival. He denies any increased distension or nausea since restarting.   Per nursing, patient developed vomiting when trying to eat CL lunch. Patient says he threw up "immediately" after eating. Per discussion w/ MD, this may have been more related to esophageal obstructing cancer rather than intolerance to enteral intake.   Patient says he had a large BM yesterday evening. He feels better overall.   Will hold TF at 30 cc until MD evaluates this afternoon.   If patient is, in fact, unable to tolerate PO intake, his regimen may need to be adjusted slightly as we had planned for patient to receive a small amount of nutrition orally to help reduce rate and length of time patient needed to be on pump.  Per earlier discussion with surgeon, once stable, patient can be discharged on a lower volume regimen initially and can be titrated up over the course of several days at home. Patient probably does not need to meet the target goal at this time, as his regimen kcal/proten was calculated to meet needs while undergoing chemoradiation, but this is not anticipated to begin at least  for another week. Both patient and spouse are knowledgable and retain information well. They are quite capable of adjusting TF based on the patients response.   He has lost ~9 lbs since he was seen at the cancer center. This is a loss of >4% bw in 9 days. This coupled with insuffienct intake x5 days (<50% est needs) qualifies him for severe malnutrition in acute context. Will recalculate needs based on new dry weight.   Labs: Bun/Creat peaked at 38/1.33, now 24/1.04, Mag/phos had been elevated,  now WDL. Glu: 106-134 Meds:Colace, toradol, IVF, Zofran, Carafate,   Recent Labs  Lab 08/08/17 0703 08/09/17 0458 08/10/17 0500  NA 141 141 142  K 3.8 3.7 4.3  CL 91* 94* 100*  CO2 37* 36* 34*  BUN 38* 38* 24*  CREATININE 1.43* 1.33* 1.04  CALCIUM 9.2 8.8* 8.5*  MG 2.5* 2.5* 2.3  PHOS 4.8* 4.0 2.8  GLUCOSE 134* 130* 106*   Diet Order:   Diet Order           Diet NPO time specified  Diet effective now         EDUCATION NEEDS:  Education needs have been addressed  Skin:  Surgical incisions to chest/abdomen  Last BM:  6/18  Height:  Ht Readings from Last 1 Encounters:  08/10/17 '5\' 10"'  (1.778 m)   Weight:  Wt Readings from Last 1 Encounters:  08/10/17 186 lb 4.6 oz (84.5 kg)   Wt Readings from Last 10 Encounters:  08/10/17 186 lb 4.6 oz (84.5 kg)  08/01/17 195 lb 6.4 oz (88.6 kg)  07/28/17 199 lb (90.3 kg)  07/26/17 199 lb (90.3 kg)  07/13/17 200 lb (90.7 kg)  07/04/17 201 lb 12.8 oz (91.5 kg)  06/21/17 205 lb (93 kg)   Ideal Body Weight:  75.45 kg  BMI:  Body mass index is 26.73 kg/m.  Estimated Nutritional Needs: (DURING CONCOMITANT CHEMORADIATION) Kcal:  2450-2600 (29-31 kcal/kg bw) Protein:  110-127g (1.3-1.5g/kg bw) Fluid:  >2.4 L/day (13m/kcal)  NBurtis JunesRD, LDN, CNSC Clinical Nutrition Available Tues-Sat via Pager: [phone]/19/2019 3:21 PM

## 2017-08-10 NOTE — Progress Notes (Signed)
RD re consulted to resume TF. Rate to be held at 30 cc/hr initially, per md. Will restart now. Full follow up note to follow this afternoon.   Burtis Junes RD, LDN, CNSC Clinical Nutrition Available Tues-Sat via Pager: [phone] 08/10/2017 11:55 AM

## 2017-08-10 NOTE — Progress Notes (Signed)
[facility] Progress Note  7 Days Post-Op  Subjective: Having BMs, less bloated but still with sensation of nausea and spitting up at times. Had spit up without any intake. Unsure if this is from the gastric cancer. Does not feel overly bloated from the TF that have been at 30cc.  He spit up gastric juices/ bile for me while in the room.  Wondering if some of this is more of a sensation nausea from the cancer. BM today per report.   Objective: Vital signs in last 24 hours: Temp:  [98.6 F (37 C)-98.7 F (37.1 C)] 98.6 F (37 C) (06/19 1616) Pulse Rate:  [71-85] 85 (06/19 1616) Resp:  [16-18] 16 (06/19 1616) BP: (142-183)/(87-97) 183/97 (06/19 1616) SpO2:  [94 %-97 %] 94 % (06/19 1616) Weight:  [186 lb 4.6 oz (84.5 kg)] 186 lb 4.6 oz (84.5 kg) (06/19 0702) Last BM Date: 08/09/17  Intake/Output from previous day: 06/18 0701 - 06/19 0700 In: 1986.7 [I.V.:1786.7; IV Piggyback:200] Out: 250 [Urine:250] Intake/Output this shift: Total I/O In: 1424.5 [I.V.:1250; NG/GT:124.5; IV Piggyback:50] Out: -   General appearance: alert, cooperative and no distress Resp: normal work breathing, port site with improved bruising, no erythema or drainage GI: soft, mildly distended, J tube in place, no erythema or drainage from the wound Extremities: extremities normal, atraumatic, no cyanosis or edema J tube had bilious drainage in it prior to starting feeds   Lab Results:  Recent Labs    08/10/17 0500  WBC 6.8  HGB 14.4  HCT 43.8  PLT 217   BMET Recent Labs    08/09/17 0458 08/10/17 0500  NA 141 142  K 3.7 4.3  CL 94* 100*  CO2 36* 34*  GLUCOSE 130* 106*  BUN 38* 24*  CREATININE 1.33* 1.04  CALCIUM 8.8* 8.5*    Anti-infectives:  Assessment/Plan: Mr. Ahlgren is a 62 yo with gastric cancer and progressive dysphagia,  s/p Port a catheter and Jejunostomy feeding tube placement. Patient jejunostomy tube balloon was causing proximal obstruction, and he also had some  degree of ileus distal. He has been on 30cc of feeds per hour and has tolerates this, but he is still having the sensation of nausea and is spitting up bile/ gastric juices. I am unsure at this time if this is related to his gastric cancer and po intake since we know that the J tube is no longer causing obstruction and since he has been having BMs today.   -PRN for pain -Home BP med changed to TID pill that will be crushed and go through the J tube  -Nausea medscheduled, and reglan added for motility and possibly will help the stomach empty  -Sips for comfort by mouth, carafate by mouth to help with esophageal burning -J tube feeds to 60 now, and will see how he tolerates  -No fevers, no leukocytosis, lytes good  -SCDS, heparin sq    LOS: 7 days    Virl Cagey 08/10/2017

## 2017-08-10 NOTE — Progress Notes (Signed)
Patient states he had large bowel movement earlier in shift and states he feels as if his abdomen feels much better.

## 2017-08-10 NOTE — Progress Notes (Signed)
Pt having increased nausea with any PO intake. Wife states pt "threw up everything" after attempting to eat lunch. RN did not witness. Dr. Constance Haw paged and made aware. New orders to change PO meds to Per Tube and make NPO for now. Wife and patient updated. Verbalized understanding.

## 2017-08-11 LAB — BASIC METABOLIC PANEL
Anion gap: 7 (ref 5–15)
BUN: 16 mg/dL (ref 6–20)
CALCIUM: 8.4 mg/dL — AB (ref 8.9–10.3)
CO2: 29 mmol/L (ref 22–32)
CREATININE: 0.87 mg/dL (ref 0.61–1.24)
Chloride: 105 mmol/L (ref 101–111)
GFR calc Af Amer: >60 mL/min (ref >60)
GFR calc non Af Amer: >60 mL/min (ref >60)
GLUCOSE: 108 mg/dL — AB (ref 65–99)
Potassium: 3.9 mmol/L (ref 3.5–5.1)
Sodium: 141 mmol/L (ref 135–145)

## 2017-08-11 LAB — PHOSPHORUS: Phosphorus: 2.4 mg/dL — ABNORMAL LOW (ref 2.5–4.6)

## 2017-08-11 LAB — MAGNESIUM: MAGNESIUM: 2.3 mg/dL (ref 1.7–2.4)

## 2017-08-11 MED ORDER — VERAPAMIL HCL 80 MG PO TABS: 80.0000 mg | ORAL_TABLET | Freq: Three times a day (TID) | ORAL | 1 refills | Status: DC

## 2017-08-11 MED ORDER — METOCLOPRAMIDE HCL 5 MG/5ML PO SOLN: 5.0000 mg | Freq: Three times a day (TID) | ORAL | 0 refills | Status: DC

## 2017-08-11 MED ORDER — DOCUSATE SODIUM 50 MG/5ML PO LIQD: 100.0000 mg | Freq: Two times a day (BID) | ORAL | 0 refills | Status: DC

## 2017-08-11 MED ORDER — FAMOTIDINE 40 MG/5ML PO SUSR: 20.0000 mg | Freq: Two times a day (BID) | ORAL | 0 refills | Status: DC

## 2017-08-11 MED ORDER — SUCRALFATE 1 GM/10ML PO SUSP: 1.0000 g | Freq: Three times a day (TID) | ORAL | 2 refills | Status: AC

## 2017-08-11 MED ORDER — OXYCODONE HCL 20 MG/ML PO CONC: 6.0000 mg | ORAL | 0 refills | Status: DC | PRN

## 2017-08-11 MED ORDER — BISACODYL 10 MG RE SUPP: 10.0000 mg | Freq: Every day | RECTAL | 0 refills | Status: DC | PRN

## 2017-08-11 MED ORDER — ONDANSETRON 4 MG PO TBDP: 4.0000 mg | ORAL_TABLET | Freq: Four times a day (QID) | ORAL | 1 refills | Status: AC | PRN

## 2017-08-11 NOTE — Discharge Summary (Signed)
Physician Discharge Summary  Patient ID: Phillip Lane MRN: [id number] DOB/AGE: 1955/12/31 62 y.o.  Admit date: 08/03/2017 Discharge date: 08/11/2017  Admission Diagnoses:  Discharge Diagnoses:  Active Problems:   Gastroesophageal cancer (HCC)   Gastric cancer (HCC)   Protein-calorie malnutrition, severe   Discharged Condition: [place] Hospital Course: [name] was admitted on 08/03/2017 after undergoing a port a catheter placement to the right subclavian and a jejunostomy feeding tube placement. Post operatively, he had issues with a partial ileus and did not tolerate his tube feeds. He had been struggling with progressively worsening dysphagia, and the jejunostomy tube was placed for feeds and hydration.    We attempted to give him TF through the jejunostomy tube and he had issues with nausea and bloating. An NG was placed he did have 4L of output. He ultimately started to have some BMs but was still having some nausea. A CT was performed, and this demonstrated a bowel obstruction in the jejunum at the balloon which had 5cc of sterile water in it at taht time. The balloon was completely deflated, and over the course of the next two days his distention and bloating improved. He continued to have BMs, and he was started back on TFs which were advanced to 60cc/ hr.  He has a goal TF that is higher and he is going to work at home to get to this goal provided by the nutrition team.    The patient prior to being discharged was placed on all liquid meds or crushed meds per his Jejunostomy tube. He is taking carafate by mouth for comfort given his progressive spitting up from his cancer.  He is on scheduled reglan to help with motility and to help with nausea, and has as needed nausea medication.   Consults: Dietition  Significant Diagnostic Studies: CT a/p- jejunostomy balloon obstructing lumen with dilated bowel and stomach proximal, no leakage of contrast, no free fluid, minimal free air post  operatively   Treatments: Port a catheter placement and jejunostomy tube placement 08/03/2017    Discharge Exam: Blood pressure (!) 143/72, pulse 85, temperature 97.6 F (36.4 C), temperature source Oral, resp. rate 20, height 5\' 10"  (1.778 m), weight 186 lb 4.6 oz (84.5 kg), SpO2 96 %. General appearance: alert, cooperative and no distress Resp: normal work breathing GI: soft, minimally distended, minimally tender, staples c/d/i with no erythema or drainage, J tube in place (RN to remove staples prior to discharge and place steristrips) Extremities: extremities normal, atraumatic, no cyanosis or edema  Disposition: Discharge disposition: 01-Home or Self Care       Discharge Instructions    Call MD for:  difficulty breathing, headache or visual disturbances   Complete by:  As directed    Call MD for:  extreme fatigue   Complete by:  As directed    Call MD for:  persistant dizziness or light-headedness   Complete by:  As directed    Call MD for:  persistant nausea and vomiting   Complete by:  As directed    Call MD for:  redness, tenderness, or signs of infection (pain, swelling, redness, odor or green/yellow discharge around incision site)   Complete by:  As directed    Call MD for:  severe uncontrolled pain   Complete by:  As directed    Call MD for:  temperature >100.4   Complete by:  As directed    Increase activity slowly   Complete by:  As directed  Allergies as of 08/11/2017      Reactions   Adhesive [tape] Other (See Comments)   Paper tape only      Medication List    STOP taking these medications   atorvastatin 40 MG tablet Commonly known as:  LIPITOR   losartan 100 MG tablet Commonly known as:  COZAAR   omeprazole 20 MG capsule Commonly known as:  PRILOSEC   Verapamil HCl CR 200 MG Cp24 Replaced by:  verapamil 80 MG tablet   Vitamin D3 5000 units Caps     TAKE these medications   bisacodyl 10 MG suppository Commonly known as:   DULCOLAX Place 1 suppository (10 mg total) rectally daily as needed for moderate constipation.   docusate 50 MG/5ML liquid Commonly known as:  COLACE Place 10 mLs (100 mg total) into feeding tube 2 (two) times daily.   famotidine 40 MG/5ML suspension Commonly known as:  PEPCID Place 2.5 mLs (20 mg total) into feeding tube 2 (two) times daily.   lidocaine-prilocaine cream Commonly known as:  EMLA Apply to affected area once   metoCLOPramide 5 MG/5ML solution Commonly known as:  REGLAN Place 5 mLs (5 mg total) into feeding tube 4 (four) times daily -  before meals and at bedtime.   ondansetron 4 MG disintegrating tablet Commonly known as:  ZOFRAN-ODT Take 1 tablet (4 mg total) by mouth every 6 (six) hours as needed for nausea or vomiting. What changed:    medication strength  how much to take  when to take this  additional instructions   oxyCODONE 20 MG/ML concentrated solution Commonly known as:  ROXICODONE INTENSOL Place 0.3 mLs (6 mg total) into feeding tube every 4 (four) hours as needed for severe pain or breakthrough pain.   sucralfate 1 GM/10ML suspension Commonly known as:  CARAFATE Take 10 mLs (1 g total) by mouth 4 (four) times daily -  with meals and at bedtime.   verapamil 80 MG tablet Commonly known as:  CALAN Place 1 tablet (80 mg total) into feeding tube every 8 (eight) hours. Crush med to fine powder and mix with 15-30 cc of water and give through the tube. Replaces:  Verapamil HCl CR 200 MG Cp24      Follow-up Information    [facility] Follow up.   Why:  supply Osmolite and pump for tube feeds. Contact information: [address] [place] Follow up.   Why:  home health RN  Contact information: [place] On 08/30/2017.   Specialty:  Oncology Why:  at 9:00 am for Labs, Physician and treatment Contact information: [address] [phone] Prudy Feeler [place]       Virl Cagey, MD Follow up in 2 week(s).   Specialty:  General Surgery Why:  2- 3 weeks; j tube check Contact information: [address] Linna Hoff [facility] [address] [phone]        Curlene Labrum, MD Follow up in 1 week(s).   Specialty:  Family Medicine Why:  1-2 weeks to assess for liquid medication changes listed in discharge. And to check your blood pressure given the blood pressure medication change.  Contact information: [place] [address] [phone]           Discharge Instructions: Shower per your regular routine. Keep the J tube covered while showering. No  submerging in a bath.  Take liquid tylenol or tylenol suppository as needed for mild pain control, and take liquid roxicodone for breakthrough pain. Take colace for constipation related to narcotic pain medication. You can stop the colace if you are having loose stools.  Do not pick at the staples. You can keep a dry dressing over the area as desired. Take the Carafate by mouth to soothe the esophagus. All other meds should be taken by the tube. Most of the meds are liquid.  Your new BP medication, which is three times a day (as it is not an extended release), will have to be crushed and put through the tube. Crush the med to a fine powder and mix with 15-30 cc of water and put through the tube. Flush the tube with 30cc of water after the medication is given.  For NOW do not restart your losartan, your Vit D, or your statin. Follow up with your PCP, in the next 1-2 weeks, to discuss any liquid options that might be available versus holding these medications until your are able to swallow oral intake again.  No heavy lifting > 10 lbs, excessive squatting, bending, pulling/ pushing for the next 8 weeks after surgery.  Increase activity as tolerated, walking and moving around is good. Incentive spirometry to continue  while at home until moving around more and becoming more active.  ORAL Diet as tolerated but I would only take in sips of very thin liquids to prevent you from spitting them up.   You may ultimately need a spit cup, if you are continuing to spit frequently.   NOTE: If you do not tolerate the full feeds for a night or two it is ok, let your home health RN know, let us know and let the oncology doctors know. **It is more important to get in your daily free water intake to prevent dehydration. Your goal is 1200 cc which is 200cc every 4 hours, or if needed you can given 100 cc every 2 hours to reach the same goal, if you are not tolerating a large bolus of water initially.   Jejunostomy tube Care: Abdominal binder/ ace bandage around the abdomen and over the jejunostomy tube to keep it from being pulled out while sleeping and while active.  Keep the tube and bolster at the stitch (black thread) that is marking the top of the bolster (stitch is 8cm from the thicker part of the tube at the end). You do not want the tube to be looser or tighter than this to keep it in place and comfortable. There is NO sterile water in the balloon port now as this was causing an obstruction in your intestines. Do NOT fill the balloon port with any liquid.   Follow remaining Jejunostomy tube care and feeding as per the home health RN. Follow the dietitians instructions for feeds and supplements to give you sufficient calories.  Flush the jejunostomy tube regularly before/ after feeds start and during the day.     Care of a Feeding Tube Feeding tubes are often given to those who have trouble swallowing or cannot take food or medicine. A feeding tube can:  Go into the nose and down to the stomach.  Go through the skin in the belly (abdomen) and into the stomach or small bowel.  Supplies needed to care for the tube site:  Clean gloves.  Clean wash cloth, gauze pads, or soft paper towel.  Cotton swabs.  Skin  barrier ointment  or cream.  Soap and water.  Precut foam pads or gauze (that go around the tube).  Tube tape. Tube site care 1. Have all supplies ready. 2. Wash hands well. 3. Put on clean gloves. 4. Remove dirty foam pads or gauze near the tube site, if present. 5. Check the skin around the tube site for redness, rash, puffiness (swelling), leaking fluid, or extra tissue growth. Call your doctor if you see any of these. 6. Wet the gauze and cotton swabs with water and soap. 7. Wipe the area closest to the tube with cotton swabs. Wipe the surrounding skin with moistened gauze. Rinse with water. 8. Dry the skin and tube site with a dry gauze pad or soft paper towel. Do not use antibiotic ointments at the tube site. 9. If the skin is red, apply petroleum jelly in a circular motion, using a cotton swab. Your doctor may suggest a different cream or ointment. Use what the doctor suggests. 10. Apply a new pre-cut foam pad or gauze around the tube. Tape the edges down. Foam pads or gauze may be left off if there is no fluid at the tube site. 11. Use tape or a device that will attach your feeding tube to your skin or do as directed. Rotate where you tape the tube. 12. Sit the person up. 13. Throw away used supplies. 14. Remove gloves. 15. Wash hands. Supplies needed to flush a feeding tube:  Clean gloves.  60 mL syringe (that connects to feeding tube).  Towel.  Water. Flushing a feeding tube 1. Have all supplies ready. 2. Wash hands well. 3. Put on clean gloves. 4. Pull 30 mL of water into the syringe. 5. Bend (kink) the feeding tube while disconnecting it from the feeding-bag tubing or while removing the plug at the end of the tube. 6. Insert the tip of the syringe into the end of the feeding tube. Stop bending the tube. Slowly inject the water. 7. If you cannot inject the water, the person with the feeding tube should lay on their left side. ? Do not use a syringe smaller than 60 mL  to flush the tubing. ? Do not inject the water with force. 8. After injecting the water, remove the syringe. 9. Always flush the tube before giving the first medicine, between medicines, and after the final medicine before starting a feeding. ? Do not mix medicines with liquid food (formula) before giving medicines. ? Do not mix medicines with other medicines before giving medicines. ? Completely flush medicines through the tube so they do not mix with the liquid food. 10. Throw away used supplies. 11. Remove gloves. 12. Wash hands. This information is not intended to replace advice given to you by your health care provider. Make sure you discuss any questions you have with your health care provider. Document Released: 11/03/2011 Document Revised: 07/17/2015 Document Reviewed: 09/23/2011 Elsevier Interactive Patient Education  2017 [employer]   Signed: Virl Cagey 08/11/2017, 12:45 PM

## 2017-08-11 NOTE — Progress Notes (Signed)
Nutrition Brief Follow Up Note  RD asked to review TF regimen/instructions as pt will be discharging today.   Met with patient/spouse. Reviewed the instructions regarding the gradually titrated TF regimen (see pior note); he will infuse x12 hrs _0  cc tonight, and if tolerates, will increase to 85 cc for a couple days, and then advance to goal of 110 cc/hr x 12 hrs. Reviewed what should be done if pt experiences intolerance to a heightened rate  Pt does not sound like he will be taking anything by mouth. Because of this, patient will need to flush w/ 250 cc q 4 hrs to meet hydration needs (except while asleep) to provide additional 1.25 L fluid for total of ~2.25 L/day. Explained this is only estimate and he may require more. Reviewed s/s of dehydration.   Also discussed potential need to increase goal rate since he will not be eating at all by mouth. His current regimen goal alone would provide 2000 kcals, which is likely a little on low side. They are told to check wt frequently. [facility] RD will f/u outpatient next week.   Verified w/ CM that the patients TF supplies will be present at home on D/C today and they will be instructed by [facility] staff regarding TF administration this evening as well.   Answered questions.   Burtis Junes RD, LDN, CNSC Clinical Nutrition Available Tues-Sat via Pager: [phone] 08/11/2017 1:12 PM

## 2017-08-11 NOTE — Care Management Note (Signed)
Case Management Note  Patient Details  Name: Phillip Lane MRN: [id number] Date of Birth: [birth year]  Expected Discharge Date:  08/11/17               Expected Discharge Plan:  [place]  In-House Referral:  Nutrition  Discharge planning Services  CM Consult  Post Acute Care Choice:  Home Health, Durable Medical Equipment Choice offered to:  Patient  DME Arranged:  Tube feeding DME Agency:  [place]:  RN [facility] Agency:  Other - See comment  Status of Service:  Completed, signed off  If discussed at [place] of Stay Meetings, dates discussed:  08/11/17  Additional Comments: DC home today. CM has notified [facility] and will fax DC summary when available. Per conversation with [facility] rep on 08/09/17, RN will be able to make first visit on Friday. Juliann Pulse, [facility] rep, aware of DC today, TF and pump should be delivered to pt today.   Sherald Barge, RN 08/11/2017, 1:00 PM

## 2017-08-11 NOTE — Discharge Instructions (Signed)
Discharge Instructions: Shower per your regular routine. Keep the J tube covered while showering. No submerging in a bath.  Take liquid tylenol or tylenol suppository as needed for mild pain control, and take liquid roxicodone for breakthrough pain. Take colace for constipation related to narcotic pain medication. You can stop the colace if you are having loose stools.  Do not pick at the staples. You can keep a dry dressing over the area as desired. Take the Carafate by mouth to soothe the esophagus. All other meds should be taken by the tube. Most of the meds are liquid.  Your new BP medication, which is three times a day (as it is not an extended release), will have to be crushed and put through the tube. Crush the med to a fine powder and mix with 15-30 cc of water and put through the tube. Flush the tube with 30cc of water after the medication is given.  For NOW do not restart your losartan, your Vit D, or your statin. Follow up with your PCP, in the next 1-2 weeks, to discuss any liquid options that might be available versus holding these medications until your are able to swallow oral intake again.  No heavy lifting > 10 lbs, excessive squatting, bending, pulling/ pushing for the next 8 weeks after surgery.  Increase activity as tolerated, walking and moving around is good. Incentive spirometry to continue while at home until moving around more and becoming more active.  ORAL Diet as tolerated but I would only take in sips of very thin liquids to prevent you from spitting them up.   You may ultimately need a spit cup, if you are continuing to spit frequently.   NOTE: If you do not tolerate the full feeds for a night or two it is ok, let your home health RN know, let us know and let the oncology doctors know. **It is more important to get in your daily free water intake to prevent dehydration. Your goal is 1200 cc which is 200cc every 4 hours, or if needed you can given 100 cc every 2 hours to  reach the same goal, if you are not tolerating a large bolus of water initially.   Jejunostomy tube Care: Abdominal binder/ ace bandage around the abdomen and over the jejunostomy tube to keep it from being pulled out while sleeping and while active.  Keep the tube and bolster at the stitch (black thread) that is marking the top of the bolster (stitch is 8cm from the thicker part of the tube at the end). You do not want the tube to be looser or tighter than this to keep it in place and comfortable. There is NO sterile water in the balloon port now as this was causing an obstruction in your intestines. Do NOT fill the balloon port with any liquid.   Follow remaining Jejunostomy tube care and feeding as per the home health RN. Follow the dietitians instructions for feeds and supplements to give you sufficient calories.  Flush the jejunostomy tube regularly before/ after feeds start and during the day.     Care of a Feeding Tube Feeding tubes are often given to those who have trouble swallowing or cannot take food or medicine. A feeding tube can:  Go into the nose and down to the stomach.  Go through the skin in the belly (abdomen) and into the stomach or small bowel.  Supplies needed to care for the tube site:  Clean gloves.  Clean wash  cloth, gauze pads, or soft paper towel.  Cotton swabs.  Skin barrier ointment or cream.  Soap and water.  Precut foam pads or gauze (that go around the tube).  Tube tape. Tube site care 1. Have all supplies ready. 2. Wash hands well. 3. Put on clean gloves. 4. Remove dirty foam pads or gauze near the tube site, if present. 5. Check the skin around the tube site for redness, rash, puffiness (swelling), leaking fluid, or extra tissue growth. Call your doctor if you see any of these. 6. Wet the gauze and cotton swabs with water and soap. 7. Wipe the area closest to the tube with cotton swabs. Wipe the surrounding skin with moistened gauze. Rinse  with water. 8. Dry the skin and tube site with a dry gauze pad or soft paper towel. Do not use antibiotic ointments at the tube site. 9. If the skin is red, apply petroleum jelly in a circular motion, using a cotton swab. Your doctor may suggest a different cream or ointment. Use what the doctor suggests. 10. Apply a new pre-cut foam pad or gauze around the tube. Tape the edges down. Foam pads or gauze may be left off if there is no fluid at the tube site. 11. Use tape or a device that will attach your feeding tube to your skin or do as directed. Rotate where you tape the tube. 12. Sit the person up. 13. Throw away used supplies. 14. Remove gloves. 15. Wash hands. Supplies needed to flush a feeding tube:  Clean gloves.  60 mL syringe (that connects to feeding tube).  Towel.  Water. Flushing a feeding tube 1. Have all supplies ready. 2. Wash hands well. 3. Put on clean gloves. 4. Pull 30 mL of water into the syringe. 5. Bend (kink) the feeding tube while disconnecting it from the feeding-bag tubing or while removing the plug at the end of the tube. 6. Insert the tip of the syringe into the end of the feeding tube. Stop bending the tube. Slowly inject the water. 7. If you cannot inject the water, the person with the feeding tube should lay on their left side. ? Do not use a syringe smaller than 60 mL to flush the tubing. ? Do not inject the water with force. 8. After injecting the water, remove the syringe. 9. Always flush the tube before giving the first medicine, between medicines, and after the final medicine before starting a feeding. ? Do not mix medicines with liquid food (formula) before giving medicines. ? Do not mix medicines with other medicines before giving medicines. ? Completely flush medicines through the tube so they do not mix with the liquid food. 10. Throw away used supplies. 11. Remove gloves. 12. Wash hands. This information is not intended to replace advice  given to you by your health care provider. Make sure you discuss any questions you have with your health care provider. Document Released: 11/03/2011 Document Revised: 07/17/2015 Document Reviewed: 09/23/2011 Elsevier Interactive Patient Education  2017 [employer].

## 2017-08-11 NOTE — Progress Notes (Signed)
Pt discharged home today per Dr. Constance Haw. Pt's IV site D/C'd and WDL. Pt's VSS. Wife return demonstrated how to give medications via syringe/gravity per J Tube. Questions answered accordingly. Staples to midline incision removed per Dr. Constance Haw order, steri's placed over incision site. Wife and pt educated on the importance of allowing steri's to fall off and not to pull them off. Verbalized understanding. Pt and wife provided with home medication list, discharge instructions and prescriptions. Verbalized understanding. Wife given tube feed supplies from room. Pt left floor via WC in stable condition accompanied by NT.

## 2017-08-12 ENCOUNTER — Telehealth (HOSPITAL_COMMUNITY): Payer: Self-pay | Admitting: Dietician

## 2017-08-12 NOTE — Telephone Encounter (Signed)
Received call from spouse regarding concerns of patient TF tolerance. Apparently the patient did well with osmolite 1.5 @ 60cc hr over night. They tried advancing to 85 this morning, but patient said he started to "have gas and feel full". She is worried he is receiving too much free water with his meds. She asked for some clarification regarding appropriate fluid amounts. Pt states he has NOT been urinating frequently and HAS a dry mouth. He is unable to take ANYTHING by mouth.   RD reviewed fluid estimates. Needs ~2-2.2 L. He is getting about .55 L from TF alone. Reviewed ways to get the rest of the fluid. Recommended giving it during med pass. Further advised to hold rate of TF @ 60 tonight.   They are told to call nurse on call at cancer center if patient develops any acute complications over weekend. RD will not be back in office until 6/26  Burtis Junes RD, LDN, [facility] Clinical Nutrition Available Tues-Sat via Pager: [phone] 08/12/2017 3:10 PM

## 2017-08-15 ENCOUNTER — Telehealth (HOSPITAL_COMMUNITY): Payer: Self-pay | Admitting: *Deleted

## 2017-08-15 ENCOUNTER — Ambulatory Visit (HOSPITAL_COMMUNITY): Payer: BLUE CROSS/BLUE SHIELD

## 2017-08-15 ENCOUNTER — Encounter: Payer: BLUE CROSS/BLUE SHIELD | Admitting: Cardiothoracic Surgery

## 2017-08-15 ENCOUNTER — Other Ambulatory Visit (HOSPITAL_COMMUNITY): Payer: BLUE CROSS/BLUE SHIELD

## 2017-08-16 ENCOUNTER — Other Ambulatory Visit (HOSPITAL_COMMUNITY): Payer: Self-pay | Admitting: Hematology

## 2017-08-16 ENCOUNTER — Inpatient Hospital Stay (HOSPITAL_COMMUNITY): Payer: BLUE CROSS/BLUE SHIELD

## 2017-08-16 ENCOUNTER — Other Ambulatory Visit (HOSPITAL_COMMUNITY): Payer: BLUE CROSS/BLUE SHIELD

## 2017-08-16 ENCOUNTER — Ambulatory Visit (HOSPITAL_COMMUNITY): Payer: BLUE CROSS/BLUE SHIELD

## 2017-08-16 ENCOUNTER — Ambulatory Visit (HOSPITAL_COMMUNITY): Payer: BLUE CROSS/BLUE SHIELD | Admitting: Hematology

## 2017-08-16 DIAGNOSIS — M549 Dorsalgia, unspecified: Secondary | ICD-10-CM

## 2017-08-16 MED ORDER — CYCLOBENZAPRINE HCL 5 MG PO TABS: 5.0000 mg | ORAL_TABLET | Freq: Three times a day (TID) | ORAL | 0 refills | Status: AC | PRN

## 2017-08-17 ENCOUNTER — Other Ambulatory Visit: Payer: Self-pay | Admitting: General Surgery

## 2017-08-17 ENCOUNTER — Ambulatory Visit (HOSPITAL_COMMUNITY): Payer: BLUE CROSS/BLUE SHIELD | Admitting: Hematology

## 2017-08-18 ENCOUNTER — Encounter (HOSPITAL_COMMUNITY): Payer: Self-pay | Admitting: Dietician

## 2017-08-18 NOTE — Progress Notes (Signed)
Called patient's wife to follow up on Jejunostomy Tube feeds as RD had stated he would at hospital d/c.   She states that the patient has just recently gotten up to a rate of 110 cc/hr. Unfortunately, he has had a lot of trouble staying on the pump for 12 hrs. Apparently the patient has severe back pain, and this is aggravated by lying down with the pump infusing. She says, except for 1x, the patient has never been on the pump >8 hrs; first night was only on 3 hrs. They recently received muscle relaxer Rx.  Wt wise, she says patient weighed himself today as 180 lbs. She reports him losing ~1 lb/day, which would be expected given inability to stay on pump >8 hrs.   Patient is taking nothing by mouth and wife says nothing goes down or comes up from esophagus; it is completely closed off. She says the patient gets choked on his secretions at night because he cannot swallow them; he has to spit them out.   Fortunately, patient does sound to have been getting enough fluids. Spouse flushing tube regularly (often 300cc at time) and trying to provide 1.5 liters in free water. She says pt has not had any dizziness. His urine is clear. He IS fatigued, but this likely result of insufficient kcals. She wonders if she can provide >300 cc at a time or if pt has "upper limit on water".   RD recommended that the patient break up his pump feedings more. He can do some at night and some during the day. Wife was concerned if TF could be given while pt in recliner. He can certainly administer TF while sitting in recliner and watching TV. She can also flush with >300 cc/time if patient tolerates.   Patient IS tolerating tube feeding, no constipation/diarrhea. In fact, pt stopped taking stool softeners as he was having diarrhea with them. They havent used them for a while now. Also, wife reports PCP weaning pt off BP meds slowly.   RD answered questions. Encouraged to reinforce to pt the need to stay on pump and try to make  up the hours he misses during nights. She has appt with [facility] RD tom.   Burtis Junes RD, LDN, [facility] Clinical Nutrition Available Tues-Sat via Pager: [phone] 08/18/2017 12:33 PM

## 2017-08-19 ENCOUNTER — Inpatient Hospital Stay (HOSPITAL_COMMUNITY): Payer: BLUE CROSS/BLUE SHIELD

## 2017-08-19 ENCOUNTER — Institutional Professional Consult (permissible substitution): Payer: BLUE CROSS/BLUE SHIELD | Admitting: Cardiothoracic Surgery

## 2017-08-19 ENCOUNTER — Other Ambulatory Visit (HOSPITAL_COMMUNITY): Payer: Self-pay | Admitting: *Deleted

## 2017-08-19 ENCOUNTER — Other Ambulatory Visit: Payer: Self-pay

## 2017-08-19 ENCOUNTER — Encounter: Payer: Self-pay | Admitting: Cardiothoracic Surgery

## 2017-08-19 VITALS — BP 132/81 | HR 96 | Resp 18 | Ht 70.0 in | Wt 179.0 lb

## 2017-08-19 DIAGNOSIS — C159 Malignant neoplasm of esophagus, unspecified: Secondary | ICD-10-CM

## 2017-08-19 MED ORDER — ZOLPIDEM TARTRATE 5 MG/ACT PO SOLN: 1.0000 | Freq: Every day | ORAL | 0 refills | Status: DC

## 2017-08-19 MED ORDER — OSMOLITE 1.5 CAL PO LIQD: ORAL | 0 refills | Status: AC

## 2017-08-19 NOTE — Progress Notes (Signed)
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  Patient s/p J tube placement on 6/12 for dysphagia.  Planning to start chemo and radiation therapy.  Past medical history of HTN, HLD, OSA.  Met with patient and wife today in clinic.  Wife with multiple questions.  Concerned about J-tube pulling out and thinks it has moved.  Previous documentation from RD reviewed.  Wife reports patient tolerated 130m of osmolite 1.5 from 6pm until 5:30am last night very well. This is the first night that he has tolerated feeding at goal rate since tube has been placed.  Had bowel movement this am (loose not watery).  Wife giving about 15059mwater flush (about 30048m times per day). Uses water flush for medications as well.    Nutrition Focused Physical Exam: deferred  Medications: reglan, pepcid, colace stopped  Labs: reviewed  Anthropometrics:   Height: 70 inches Weight: 179 lb taken today in clinic UBW: 205 -215 lb (noted last 205 lb in April BMI: 26  9% weight loss int 2 months   Estimated Energy Needs  Kcals: 2450-2600 calories/d Protein: 110-127 g/d Fluid: > 2.4 L/d  NUTRITION DIAGNOSIS: Inadequate oral nutrition related to cancer, dysphagia as evidenced by 9% weight loss and new J-tube   INTERVENTION:  Recommend increasing tube feeding to 140m54mr 12 hours to better meet nutritional needs.  Encouraged to adjust tube feeding up by 10ml32mly until goal rate reached.   Tube feeding will provide 2520 calories, 105 g protein and 2776 ml free water (free water from formula and 1.5 L flush).  Wife and patient feel comfortable with that.  RD will contact [facility] wife to call surgeon's office regarding concern for tube pulling out some from original location.  Wife verbalized understanding.   All questions answered to wife and patient's satisfaction.      MONITORING, EVALUATION, GOAL: weight trends, intake   NEXT VISIT: July 9  Dayna Geurts B. AllenZenia Resides LWarren RRobinhoodstered Dietitian [phone])

## 2017-08-19 NOTE — Progress Notes (Signed)
[address]       [place] [address]             [phone]                    Amante D Speranza [place] Medical Record #[id number] Date of Birth: [birth year]  Referring: Derek Jack, MD Primary Care: Curlene Labrum, MD Primary Cardiologist: No primary care provider on file.  Chief Complaint:    Chief Complaint  Patient presents with  . Esophageal Cancer    New patient consultation PET 07/12/2017, CT Abd/ Pelvis 08/09/2017    History of Present Illness:    Phillip Lane 62 y.o. 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  At the end of April the patient was first evaluated because of the piece of steak getting stuck in his throat requiring endoscopy to remove.  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  Further staging has not been completed, and EUS could not be completed because of near complete obstruction of the GE junction and inability to pass the scope.  A feeding jejunostomy tube was placed, the patient had problem with postoperative ileus and bowel obstruction which ultimately resolved.  He is scheduled in early July to start radiation and chemo.    Patient denies any previous GI complaints he says he has not been bothered by significant reflux over the years.  He does smoke he denies any previous cardiac history   Current Activity/ Functional Status:  Patient is independent with mobility/ambulation, transfers, ADL's, IADL's.   Zubrod Score: At the time of surgery this patient's most appropriate activity status/level should be described as: []     0    Normal activity, no symptoms [x]     1    Restricted in physical strenuous activity but ambulatory, able to do out light work []     2    Ambulatory and capable of self care, unable to do work activities, up and about               >50 % of waking hours                              []     3    Only limited self care, in bed  greater than 50% of waking hours []     4    Completely disabled, no self care, confined to bed or chair []     5    Moribund   Past Medical History:  Diagnosis Date  . Hyperlipidemia   . Hypertension   . Sleep apnea, obstructive     Past Surgical History:  Procedure Laterality Date  . colonoscopy     [place] 79 polyps removed, age 20: ? POLYPS, AGE 89: ? POLYPS  . ESOPHAGEAL DILATION N/A 06/21/2017   Procedure: ESOPHAGEAL DILATION;  Surgeon: Danie Binder, MD;  Location: [facility];  Service: Endoscopy;  Laterality: N/A;  . ESOPHAGOGASTRODUODENOSCOPY N/A 06/21/2017   Procedure: ESOPHAGOGASTRODUODENOSCOPY (EGD);  Surgeon: Danie Binder, MD;  Location: [facility];  Service: Endoscopy;  Laterality: N/A;  . EUS N/A 07/28/2017   Procedure: UPPER ENDOSCOPIC ULTRASOUND (EUS) RADIAL;  Surgeon: [name], MD;  Location: [facility];  Service: Endoscopy;  Laterality: N/A;  . EYE SURGERY Bilateral    cataract  . JEJUNOSTOMY N/A  08/03/2017   Procedure: OPEN JEJUNOSTOMY TUBE PLACEMENT;  Surgeon: Virl Cagey, MD;  Location: [facility];  Service: General;  Laterality: N/A;  . PORTACATH PLACEMENT N/A 08/03/2017   Procedure: INSERTION PORT-A-CATH;  Surgeon: Virl Cagey, MD;  Location: [facility];  Service: General;  Laterality: N/A;    Family History  Problem Relation Age of Onset  . Hypertension Mother   . Hypertension Father   . Diabetes Father   . Colon cancer Neg Hx   . Colon polyps Neg Hx      Social History   Tobacco Use  Smoking Status Current Every Day Smoker  . Packs/day: 1.00  . Types: Cigarettes  Smokeless Tobacco Never Used    Social History   Substance and Sexual Activity  Alcohol Use Yes   Comment: occ:1-2X/WEEK-BEER     Allergies  Allergen Reactions  . Adhesive [Tape] Other (See Comments)    Paper tape only    Current Outpatient Medications  Medication Sig Dispense Refill  . bisacodyl (DULCOLAX) 10 MG suppository Place 1  suppository (10 mg total) rectally daily as needed for moderate constipation. 12 suppository 0  . cyclobenzaprine (FLEXERIL) 5 MG tablet Take 1 tablet (5 mg total) by mouth 3 (three) times daily as needed for muscle spasms. 30 tablet 0  . docusate (COLACE) 50 MG/5ML liquid Place 10 mLs (100 mg total) into feeding tube 2 (two) times daily. 100 mL 0  . famotidine (PEPCID) 40 MG/5ML suspension Place 2.5 mLs (20 mg total) into feeding tube 2 (two) times daily. 50 mL 0  . lidocaine-prilocaine (EMLA) cream Apply to affected area once 30 g 3  . metoCLOPramide (REGLAN) 5 MG/5ML solution Place 5 mLs (5 mg total) into feeding tube 4 (four) times daily -  before meals and at bedtime. 120 mL 0  . ondansetron (ZOFRAN-ODT) 4 MG disintegrating tablet Take 1 tablet (4 mg total) by mouth every 6 (six) hours as needed for nausea or vomiting. 60 tablet 1  . oxyCODONE (ROXICODONE INTENSOL) 20 MG/ML concentrated solution Place 0.3 mLs (6 mg total) into feeding tube every 4 (four) hours as needed for severe pain or breakthrough pain. 15 mL 0  . sucralfate (CARAFATE) 1 GM/10ML suspension Take 10 mLs (1 g total) by mouth 4 (four) times daily -  with meals and at bedtime. 420 mL 2  . verapamil (CALAN) 80 MG tablet Place 1 tablet (80 mg total) into feeding tube every 8 (eight) hours. Crush med to fine powder and mix with 15-30 cc of water and give through the tube. 90 tablet 1  . Nutritional Supplements (FEEDING SUPPLEMENT, OSMOLITE 1.5 CAL,) LIQD Give osmolite 1.5 at 124ml/hr for 12 hours via continuous pump through J-tube per day.  Start at rate of 198ml/hr for 12 hours and increase by 51ml daily until goal rate of 141ml reached. (Patient not taking: Reported on 08/19/2017) 1659 mL 0  . Zolpidem Tartrate 5 MG/ACT SOLN Take 1 Act (5 mg total) by mouth at bedtime. 7.7 mL 0   No current facility-administered medications for this visit.     Pertinent items are noted in HPI.   Review of Systems:     Cardiac Review of Systems:  [Y] = yes  or   [ N ] = no   Chest Pain [ n   ]  Resting SOB Florencio.Farrier   ] Exertional SOB  [ n ]  Orthopnea [ n ]   Pedal Edema [ n  ]  Palpitations Florencio.Farrier  ] Syncope  [ n ]   Presyncope [  n ]   General Review of Systems: [Y] = yes [  ]=no Constitional: recent weight change [ y ];  Wt loss over the last 3 months [  57 ] anorexia [  ]; fatigue [ y ]; nausea [ y ]; night sweats [  ]; fever [  ]; or chills [  ];           Eye : blurred vision [  ]; diplopia [   ]; vision changes [  ];  Amaurosis fugax[  ]; Resp: cough [  ];  wheezing[  ];  hemoptysis[  ]; shortness of breath[  ]; paroxysmal nocturnal dyspnea[  ]; dyspnea on exertion[  ]; or orthopnea[  ];  GI:  gallstones[  ], vomiting[ y ];  dysphagia[  ]; melena[  ];  hematochezia [  ]; heartburn[ y ];   Hx of  Colonoscopy[  ]; GU: kidney stones [  ]; hematuria[  ];   dysuria [  ];  nocturia[  ];  history of     obstruction [  ]; urinary frequency [  ]             Skin: rash, swelling[  ];, hair loss[  ];  peripheral edema[  ];  or itching[  ]; Musculosketetal: myalgias[  ];  joint swelling[  ];  joint erythema[  ];  joint pain[  ];  back pain[  ];  Heme/Lymph: bruising[  ];  bleeding[  ];  anemia[  ];  Neuro: TIA[  ];  headaches[  ];  stroke[  ];  vertigo[  ];  seizures[  ];   paresthesias[  ];  difficulty walking[  ];  Psych:depression[  ]; anxiety[  ];  Endocrine: diabetes[  ];  thyroid dysfunction[  ];  Immunizations: Flu up to date [  ]; Pneumococcal up to date [  ];  Other:    PHYSICAL EXAMINATION: BP 132/81   Pulse 96   Resp 18   Ht 5\' 10"  (1.778 m)   Wt 179 lb (81.2 kg)   SpO2 96% Comment: RA  BMI 25.68 kg/m  General appearance: alert, cooperative and no distress Head: Normocephalic, without obvious abnormality, atraumatic Neck: no adenopathy, no carotid bruit, no JVD, supple, symmetrical, trachea midline and thyroid not enlarged, symmetric, no tenderness/mass/nodules Lymph nodes: Cervical, supraclavicular, and axillary nodes  normal. Resp: clear to auscultation bilaterally Back: symmetric, no curvature. ROM normal. No CVA tenderness. Cardio: regular rate and rhythm, S1, S2 normal, no murmur, click, rub or gallop GI: soft, non-tender; bowel sounds normal; no masses,  no organomegaly Extremities: extremities normal, atraumatic, no cyanosis or edema and Homans sign is negative, no sign of DVT Neurologic: Grossly normal Feeding jejunostomy and place left abdomen without evidence of infection  Diagnostic Studies & Laboratory data:     Recent Radiology Findings:  CLINICAL DATA:  Initial treatment strategy for adenocarcinoma at the GE junction.  EXAM: NUCLEAR MEDICINE PET SKULL BASE TO THIGH  TECHNIQUE: 9.9 mCi F-18 FDG was injected intravenously. Full-ring PET imaging was performed from the skull base to thigh after the radiotracer. CT data was obtained and used for attenuation correction and anatomic localization.  Fasting blood glucose: 93 mg/dl  COMPARISON:  None.  FINDINGS: Mediastinal blood pool activity: SUV max 2.4  NECK:  No hypermetabolic cervical lymph nodes are identified.There are no lesions of the pharyngeal mucosal space.  Incidental CT findings: Bilateral  carotid atherosclerosis.  CHEST:  There are no hypermetabolic mediastinal, hilar or axillary lymph nodes. There is intense focal hypermetabolic activity within the distal esophagus, extending to the gastroesophageal junction (approximately 4.2 cm in length). This corresponds with wall thickening or an ill-defined mass and has an SUV max of 14.7. No suspicious pulmonary activity or suspicious pulmonary nodularity.  Incidental CT findings: Diffuse coronary artery atherosclerosis with lesser atherosclerosis of the aorta and great vessels. Mild-to-moderate centrilobular emphysema.  ABDOMEN/PELVIS:  There is no hypermetabolic activity within the liver, adrenal glands, spleen or pancreas. There is no hypermetabolic  nodal activity. 8 mm celiac node on image 120/4 demonstrates activity similar to blood pool (SUV max 2.6).  Incidental CT findings: Subjective mild hepatic steatosis. There is aortic and branch vessel atherosclerosis with mild dilatation of the aorta to 3.4 cm AP. There are extensive diverticular changes throughout the sigmoid colon. Right greater than left scrotal hydroceles are noted.  SKELETON:  There is no hypermetabolic activity to suggest osseous metastatic disease.  Incidental CT findings: none  IMPRESSION: 1. Intense hypermetabolic activity in the distal esophagus extending to the GE junction, corresponding with known adenocarcinoma. 2. No hypermetabolic regional adenopathy or distant metastases demonstrated. 3. Aortic Atherosclerosis (ICD10-I70.0) and Emphysema (ICD10-J43.9). 4. Aortic aneurysm NOS (ICD10-I71.9). Recommend followup by ultrasound in 3 years. This recommendation follows ACR consensus guidelines: White Paper of the ACR Incidental Findings Committee II on Vascular Findings. [facility] Coll Radiol 2013; 10:789-794   Electronically Signed   By: Richardean Sale M.D.   On: 07/12/2017 14:46    Dg Abd 1 View  Result Date: 08/08/2017 CLINICAL DATA:  Follow-up ileus EXAM: ABDOMEN - 1 VIEW COMPARISON:  Abdominal radiographs of August 05, 2017 FINDINGS: The open jejunostomy tube appears to terminate to the right of the body of L5. There are loops of mildly distended fluid and gas-filled small bowel in the mid abdomen. The colonic stool burden is not excessive. No significant distention of the colon is observed. There is a small amount of gas within the rectum. There is some low lucency likely reflecting free extraluminal air in the right upper quadrant which may be secondary to the patient's recent open jejunostomy tube placement. IMPRESSION: Persistent mild small bowel ileus. Small amount of persistent free extraluminal gas that may be postoperative but correlation  with clinical and laboratory values is needed in an effort to judge whether an occult bowel leak or perforation could be present. Electronically Signed   By: David  Martinique M.D.   On: 08/08/2017 15:19   Dg Abd 1 View  Result Date: 08/05/2017 CLINICAL DATA:  RIGHT-side lower abdominal pain since this morning, vomiting, jejunostomy tube placed 2 days ago EXAM: ABDOMEN - 1 VIEW COMPARISON:  None FINDINGS: Nonobstructive bowel gas pattern. A few mildly dilated loops of small bowel are seen in the LEFT mid abdomen, predominantly fluid-filled with minimal air, question ileus. Linear gas collection at the porta hepatis of the liver likely represents a small amount of air, which may be normal 2 days post abdominal surgery. Scattered stool in colon. Bones unremarkable. Skin clips at mid abdomen. IMPRESSION: Few mildly prominent predominantly fluid-filled loops of small bowel in the LEFT mid abdomen question ileus. Potential small amount of free air in the RIGHT upper quadrant, not unexpected 2 days post open abdominal surgery. Electronically Signed   By: Lavonia Dana M.D.   On: 08/05/2017 17:18   Ct Abdomen Pelvis W Contrast  Result Date: 08/09/2017 CLINICAL DATA:  Gastric carcinoma with abdominal  pain. EXAM: CT ABDOMEN AND PELVIS WITH CONTRAST TECHNIQUE: Multidetector CT imaging of the abdomen and pelvis was performed using the standard protocol following bolus administration of intravenous contrast. Oral contrast administered through jejunostomy catheter. CONTRAST:  110mL ISOVUE-300 IOPAMIDOL (ISOVUE-300) INJECTION 61% COMPARISON:  None. FINDINGS: Lower chest: There is bibasilar atelectatic change. Fluid is noted in the distal esophagus. Hepatobiliary: No focal liver lesions are evident. Gallbladder wall is not appreciably thickened. There is no biliary duct dilatation. Pancreas: No pancreatic mass or inflammatory focus. Spleen: No splenic lesions are evident. Adrenals/Urinary Tract: Adrenals appear normal  bilaterally. There is no renal mass or hydronephrosis on either side. There is no renal or ureteral calculus on either side. Urinary bladder is midline with wall thickness within normal limits. Stomach/Bowel: There are scattered foci of pneumoperitoneum. No portal venous air evident. There is marked distention of the stomach with fluid and to a lesser extent air. There is diffuse dilatation of the jejunum and proximal aspect. There is a jejunostomy tube placed near the junction of the proximal and mid thirds of the jejunum. Transition zone is seen at the level of the balloon of the jejunostomy catheter. No appreciable bowel dilatation is noted distal to the jejunostomy catheter. The catheter is located within the jejunum without oral contrast extravasation. There are multiple sigmoid and descending colonic diverticula without diverticulitis. Moderate stool is seen in the colon. Vascular/Lymphatic: There is aortic atherosclerosis. There is also atherosclerotic calcification in the iliac arteries. There is dilatation of the distal aorta with a maximum transverse diameter of 3.8 x 3.4 cm. This dilatation arises inferior to the renal arteries and terminates at the bifurcation. There is no periaortic fluid. Major mesenteric arterial vessels are patent. There is no appreciable adenopathy in the abdomen or pelvis. Reproductive: Prostate and seminal vesicles are normal in size and contour. No pelvic mass evident. There is a sizable scrotal hydrocele on the right with a much smaller scrotal hydrocele on the left. Other: Appendix appears normal. No evident abscess or ascites in the abdomen or pelvis. Musculoskeletal: There is mild anterior wedging of the T12 vertebral body. There are no blastic or lytic bone lesions. No intramuscular lesions. Air is seen in the right abdominal wall and umbilical regions, likely due to recent jejunostomy placement. IMPRESSION: 1. Bowel obstruction near the junction of the proximal and mid  portions of the jejunum at the site of the jejunostomy catheter balloon. Concern that jejunostomy balloon is contributing to the bowel obstruction given the proximity. 2. Foci of pneumoperitoneum. Question pneumoperitoneum secondary to recent jejunostomy catheter placement. The possibility of viscus perforation cannot be entirely excluded. In this regard, close clinical and imaging assessment advised. 3.  No contrast extravasation from bowel. 4. Extensive left-sided colonic diverticulosis without diverticulitis. 5. Air in the umbilical region and right anterior abdominal wall are likely of postoperative etiology. 6. Fluid in the distal esophagus is likely due to the bowel obstruction. Note that the stomach is markedly distended with fluid. 7. Bilateral scrotal hydroceles, larger on the right than on the left. 8. Extensive aortoiliac atherosclerosis. Dilatation of the distal aorta measuring 3.8 x 3.4 cm. Recommend followup by ultrasound in 2 years. This recommendation follows ACR consensus guidelines: White Paper of the ACR Incidental Findings Committee II on Vascular Findings. J Am Coll Radiol 2013; 10:789-794. Aortic Atherosclerosis (ICD10-I70.0). Aortic aneurysm NOS (ICD10-I71.9). Critical Value/emergent results were called by telephone at the time of interpretation on 08/09/2017 at 11:41 am to Dr. Curlene Labrum , who verbally acknowledged these results.  Electronically Signed   By: Lowella Grip III M.D.   On: 08/09/2017 11:41   Dg Chest Port 1 View  Result Date: 08/06/2017 CLINICAL DATA:  Nasogastric tube placement. EXAM: PORTABLE CHEST 1 VIEW COMPARISON:  08/03/2017. FINDINGS: Right sided Port-A-Cath tip is in the SVC. Nasogastric tube appears to terminate in the distal esophagus. Trachea is midline. Heart size stable. Thoracic aorta is calcified. Mild right basilar platelike atelectasis. Lungs are otherwise clear. Left hemidiaphragm is elevated. Left costophrenic angle was not included on the image.  Trace residual pneumoperitoneum. IMPRESSION: 1. Nasogastric tube appears to terminate in the lower esophagus. 2. Right basilar atelectasis. 3. Trace residual pneumoperitoneum, presumably iatrogenic following recent open jejunostomy tube placement. 4.  Aortic atherosclerosis (ICD10-170.0). Electronically Signed   By: Lorin Picket M.D.   On: 08/06/2017 10:10     I have independently reviewed the above radiology studies  and reviewed the findings with the patient.   Recent Lab Findings: Lab Results  Component Value Date   WBC 6.8 08/10/2017   HGB 14.4 08/10/2017   HCT 43.8 08/10/2017   PLT 217 08/10/2017   GLUCOSE 108 (H) 08/11/2017   ALT 24 08/01/2017   AST 20 08/01/2017   NA 141 08/11/2017   K 3.9 08/11/2017   CL 105 08/11/2017   CREATININE 0.87 08/11/2017   BUN 16 08/11/2017   CO2 29 08/11/2017   EUS: ENDOSCOPIC FINDING: Findings: 1. Tight, nearly obstructing malignant stricture in the distal esophagus, presumed to be at the GE junction. I was unable to advance standard adult gastroscope or the much larger diameter radial echoendoscope through the stricture (lumen 1-10mm, see images). ENDOSONOGRAPHIC FINDING: 1. I was only above to evaluated the proximal end of the malignant stricture described above however the lesion is clearly circumferential, at least 1.3cm thick and at least passes into and through the muscularis propria layer (uT3). 2. Incomplete local nodal evaluation, uNX - The GE junction adenocarcinoma is causing a near complete stricture through which I was unable to advance standard gastroscope or a radial echoendoscope. I was however, able to evaluate the proximal end of the mass and it is clearly AT LEAST uT3Nx - He will likely benefit from neoadjuvant chemo/XRT   ENDO:06/21/2017 Patchy moderate inflammation characterized by congestion (edema), erosions and erythema was found in the duodenal bulb and in the second portion of the duodenum. - IMPACTED Food in  the lower third of the esophagus RESULTING IN ESOPHAGITIS. Removal was successful. - Benign-appearing esophageal STRICTURE. Dilated. - Enlarged gastric folds AT THE CARDIA. Biopsied. - MILD Gastritis AND EROSIVE duodenitis.  Diagnosis [id number] 1. 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 - WARTHIN-STARRY STAIN POSITIVE FOR HELICOBACTER PYLORI. - NO DYSPLASIA OR MALIGNANCY. 2. Esophagogastric junction, biopsy - POORLY DIFFERENTIATED ADENOCARCINOMA.  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  At this point we would consider lower dose radiation with follow-up surgical resection in mind.  I will see the patient back in 4 weeks by that point he should have started his treatments.  He will need follow-up restaging scans prior to surgery and consider EUS at that time  I  spent 60 minutes with  the patient face to face and greater then 50% of the time was spent in counseling and coordination of care.    Grace Isaac MD      [address] [place] [address] Office [phone]   Beeper [phone]  08/19/2017 4:04 PM

## 2017-08-19 NOTE — Addendum Note (Signed)
Addended by: Jennet Maduro B on: 08/19/2017 12:36 PM   Modules accepted: Orders

## 2017-08-22 ENCOUNTER — Other Ambulatory Visit (HOSPITAL_COMMUNITY): Payer: BLUE CROSS/BLUE SHIELD

## 2017-08-22 ENCOUNTER — Ambulatory Visit (HOSPITAL_COMMUNITY): Payer: BLUE CROSS/BLUE SHIELD

## 2017-08-23 ENCOUNTER — Ambulatory Visit (HOSPITAL_COMMUNITY): Payer: BLUE CROSS/BLUE SHIELD

## 2017-08-23 ENCOUNTER — Other Ambulatory Visit (HOSPITAL_COMMUNITY): Payer: BLUE CROSS/BLUE SHIELD

## 2017-08-26 ENCOUNTER — Other Ambulatory Visit (HOSPITAL_COMMUNITY): Payer: Self-pay | Admitting: *Deleted

## 2017-08-26 MED ORDER — PROCHLORPERAZINE MALEATE 10 MG PO TABS: 10.0000 mg | ORAL_TABLET | Freq: Four times a day (QID) | ORAL | 1 refills | Status: AC | PRN

## 2017-08-29 ENCOUNTER — Ambulatory Visit (HOSPITAL_COMMUNITY): Payer: BLUE CROSS/BLUE SHIELD

## 2017-08-29 ENCOUNTER — Encounter (HOSPITAL_COMMUNITY): Payer: Self-pay | Admitting: *Deleted

## 2017-08-29 ENCOUNTER — Other Ambulatory Visit (HOSPITAL_COMMUNITY): Payer: BLUE CROSS/BLUE SHIELD

## 2017-08-29 NOTE — Progress Notes (Signed)
Phillip Lane from [facility] called and advised that the insurance company has approved his treatment.  Patient will start concurrent radiation and chemotherapy tomorrow as scheduled. Patient is aware.

## 2017-08-30 ENCOUNTER — Inpatient Hospital Stay (HOSPITAL_COMMUNITY): Payer: BLUE CROSS/BLUE SHIELD | Admitting: Hematology

## 2017-08-30 ENCOUNTER — Encounter (HOSPITAL_COMMUNITY): Payer: Self-pay | Admitting: Hematology

## 2017-08-30 ENCOUNTER — Inpatient Hospital Stay (HOSPITAL_COMMUNITY): Payer: BLUE CROSS/BLUE SHIELD | Admitting: Dietician

## 2017-08-30 ENCOUNTER — Inpatient Hospital Stay (HOSPITAL_COMMUNITY): Payer: BLUE CROSS/BLUE SHIELD

## 2017-08-30 ENCOUNTER — Inpatient Hospital Stay (HOSPITAL_COMMUNITY): Payer: BLUE CROSS/BLUE SHIELD | Attending: Hematology

## 2017-08-30 ENCOUNTER — Other Ambulatory Visit: Payer: Self-pay

## 2017-08-30 VITALS — BP 131/88 | HR 85 | Temp 97.5°F | Resp 18 | Wt 174.3 lb

## 2017-08-30 DIAGNOSIS — R5383 Other fatigue: Secondary | ICD-10-CM | POA: Diagnosis not present

## 2017-08-30 DIAGNOSIS — K5900 Constipation, unspecified: Secondary | ICD-10-CM | POA: Insufficient documentation

## 2017-08-30 DIAGNOSIS — Z934 Other artificial openings of gastrointestinal tract status: Secondary | ICD-10-CM | POA: Insufficient documentation

## 2017-08-30 DIAGNOSIS — R140 Abdominal distension (gaseous): Secondary | ICD-10-CM | POA: Insufficient documentation

## 2017-08-30 DIAGNOSIS — K2950 Unspecified chronic gastritis without bleeding: Secondary | ICD-10-CM

## 2017-08-30 DIAGNOSIS — R232 Flushing: Secondary | ICD-10-CM | POA: Diagnosis not present

## 2017-08-30 DIAGNOSIS — Z79899 Other long term (current) drug therapy: Secondary | ICD-10-CM | POA: Insufficient documentation

## 2017-08-30 DIAGNOSIS — I10 Essential (primary) hypertension: Secondary | ICD-10-CM | POA: Insufficient documentation

## 2017-08-30 DIAGNOSIS — R1319 Other dysphagia: Secondary | ICD-10-CM

## 2017-08-30 DIAGNOSIS — E785 Hyperlipidemia, unspecified: Secondary | ICD-10-CM

## 2017-08-30 DIAGNOSIS — R110 Nausea: Secondary | ICD-10-CM | POA: Insufficient documentation

## 2017-08-30 DIAGNOSIS — R12 Heartburn: Secondary | ICD-10-CM | POA: Diagnosis not present

## 2017-08-30 DIAGNOSIS — Z5111 Encounter for antineoplastic chemotherapy: Secondary | ICD-10-CM | POA: Diagnosis not present

## 2017-08-30 DIAGNOSIS — C160 Malignant neoplasm of cardia: Secondary | ICD-10-CM

## 2017-08-30 DIAGNOSIS — R634 Abnormal weight loss: Secondary | ICD-10-CM

## 2017-08-30 DIAGNOSIS — F17210 Nicotine dependence, cigarettes, uncomplicated: Secondary | ICD-10-CM | POA: Insufficient documentation

## 2017-08-30 DIAGNOSIS — B9681 Helicobacter pylori [H. pylori] as the cause of diseases classified elsewhere: Secondary | ICD-10-CM | POA: Diagnosis not present

## 2017-08-30 DIAGNOSIS — G4733 Obstructive sleep apnea (adult) (pediatric): Secondary | ICD-10-CM | POA: Diagnosis not present

## 2017-08-30 LAB — CBC WITH DIFFERENTIAL/PLATELET
Basophils Absolute: 0 10*3/uL (ref 0.0–0.1)
Basophils Relative: 0 %
EOS PCT: 1 %
Eosinophils Absolute: 0.1 10*3/uL (ref 0.0–0.7)
HCT: 45.7 % (ref 39.0–52.0)
Hemoglobin: 15.6 g/dL (ref 13.0–17.0)
LYMPHS ABS: 3 10*3/uL (ref 0.7–4.0)
LYMPHS PCT: 32 %
MCH: 31.2 pg (ref 26.0–34.0)
MCHC: 34.1 g/dL (ref 30.0–36.0)
MCV: 91.4 fL (ref 78.0–100.0)
Monocytes Absolute: 0.9 10*3/uL (ref 0.1–1.0)
Monocytes Relative: 9 %
Neutro Abs: 5.4 10*3/uL (ref 1.7–7.7)
Neutrophils Relative %: 58 %
PLATELETS: 253 10*3/uL (ref 150–400)
RBC: 5 MIL/uL (ref 4.22–5.81)
RDW: 12.8 % (ref 11.5–15.5)
WBC: 9.4 10*3/uL (ref 4.0–10.5)

## 2017-08-30 LAB — COMPREHENSIVE METABOLIC PANEL
ALBUMIN: 3.7 g/dL (ref 3.5–5.0)
ALK PHOS: 104 U/L (ref 38–126)
ALT: 40 U/L (ref 0–44)
AST: 24 U/L (ref 15–41)
Anion gap: 10 (ref 5–15)
BUN: 20 mg/dL (ref 6–20)
CALCIUM: 9.3 mg/dL (ref 8.9–10.3)
CO2: 28 mmol/L (ref 22–32)
Chloride: 102 mmol/L (ref 98–111)
Creatinine, Ser: 0.77 mg/dL (ref 0.61–1.24)
GFR calc Af Amer: >60 mL/min (ref >60)
GFR calc non Af Amer: >60 mL/min (ref >60)
GLUCOSE: 113 mg/dL — AB (ref 70–99)
Potassium: 4.5 mmol/L (ref 3.5–5.1)
SODIUM: 140 mmol/L (ref 135–145)
Total Bilirubin: 0.4 mg/dL (ref 0.3–1.2)
Total Protein: 7.2 g/dL (ref 6.5–8.1)

## 2017-08-30 MED ORDER — DIPHENHYDRAMINE HCL 50 MG/ML IJ SOLN
50.0000 mg | Freq: Once | INTRAMUSCULAR | Status: AC
  Administered 2017-08-30: 50 mg via INTRAVENOUS

## 2017-08-30 MED ORDER — DIPHENHYDRAMINE HCL 50 MG/ML IJ SOLN
INTRAMUSCULAR | Status: AC
Start: 2017-08-30 — End: ?
  Filled 2017-08-30: qty 1

## 2017-08-30 MED ORDER — SODIUM CHLORIDE 0.9 % IV SOLN
50.0000 mg/m2 | Freq: Once | INTRAVENOUS | Status: AC
  Administered 2017-08-30: 102 mg via INTRAVENOUS
  Filled 2017-08-30: qty 17

## 2017-08-30 MED ORDER — SODIUM CHLORIDE 0.9 % IV SOLN
299.2000 mg | Freq: Once | INTRAVENOUS | Status: AC
  Administered 2017-08-30: 300 mg via INTRAVENOUS
  Filled 2017-08-30: qty 30

## 2017-08-30 MED ORDER — SODIUM CHLORIDE 0.9 % IV SOLN
Freq: Once | INTRAVENOUS | Status: AC
  Administered 2017-08-30: 12:00:00 via INTRAVENOUS

## 2017-08-30 MED ORDER — HEPARIN SOD (PORK) LOCK FLUSH 100 UNIT/ML IV SOLN
500.0000 [IU] | Freq: Once | INTRAVENOUS | Status: AC | PRN
  Administered 2017-08-30: 500 [IU]

## 2017-08-30 MED ORDER — FAMOTIDINE IN NACL 20-0.9 MG/50ML-% IV SOLN
20.0000 mg | Freq: Once | INTRAVENOUS | Status: AC
  Administered 2017-08-30: 20 mg via INTRAVENOUS

## 2017-08-30 MED ORDER — FAMOTIDINE IN NACL 20-0.9 MG/50ML-% IV SOLN
INTRAVENOUS | Status: AC
  Filled 2017-08-30: qty 50

## 2017-08-30 MED ORDER — PALONOSETRON HCL INJECTION 0.25 MG/5ML
0.2500 mg | Freq: Once | INTRAVENOUS | Status: AC
  Administered 2017-08-30: 0.25 mg via INTRAVENOUS

## 2017-08-30 MED ORDER — METHYLPREDNISOLONE SODIUM SUCC 125 MG IJ SOLR
100.0000 mg | Freq: Once | INTRAMUSCULAR | Status: AC
  Administered 2017-08-30: 100 mg via INTRAVENOUS

## 2017-08-30 MED ORDER — PALONOSETRON HCL INJECTION 0.25 MG/5ML
INTRAVENOUS | Status: AC
  Filled 2017-08-30: qty 5

## 2017-08-30 MED ORDER — SODIUM CHLORIDE 0.9 % IV SOLN
20.0000 mg | Freq: Once | INTRAVENOUS | Status: AC
  Administered 2017-08-30: 20 mg via INTRAVENOUS
  Filled 2017-08-30: qty 2

## 2017-08-30 MED ORDER — METHYLPREDNISOLONE SODIUM SUCC 125 MG IJ SOLR
INTRAMUSCULAR | Status: AC
  Filled 2017-08-30: qty 2

## 2017-08-30 NOTE — Progress Notes (Signed)
[address], [place] [address]   CLINIC:  Medical Oncology/Hematology  PCP:  Curlene Labrum, MD [place] [id number] [phone]   REASON FOR VISIT:  Follow-up for GE junction adenocarcinoma.  CURRENT THERAPY: Chemoradiation therapy.  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         CANCER STAGING: Cancer Staging No matching staging information was found for the patient.   INTERVAL HISTORY:  Phillip Lane 62 y.o. male returns for follow-up of his GE junction adenocarcinoma.  He had jejunostomy tube placed on 08/03/2017 by Dr. Constance Haw.  He lost about 12 pounds since the diagnosis.  Slowly his tube feeds are being increased.  He is receiving tube feeds via pump at nighttime for 10 hours at 140 mL/h.  He does not report any regurgitation.  He is able to swallow his sputum and coughs it back up in the evenings.  Denies any fevers or infections.  Energy levels are reported at 75%.  Has not lost any weight in the last 2 weeks.    REVIEW OF SYSTEMS:  Review of Systems  Constitutional: Positive for fatigue.  HENT:   Positive for trouble swallowing.   All other systems reviewed and are negative.    PAST MEDICAL/SURGICAL HISTORY:  Past Medical History:  Diagnosis Date  . Hyperlipidemia   . Hypertension   . Sleep apnea,  obstructive    Past Surgical History:  Procedure Laterality Date  . colonoscopy     [place] 40 polyps [place], age 79: ? POLYPS, AGE 69: ? POLYPS  . ESOPHAGEAL DILATION N/A 06/21/2017   Procedure: ESOPHAGEAL DILATION;  Surgeon: Danie Binder, MD;  Location: [facility];  Service: Endoscopy;  Laterality: N/A;  . ESOPHAGOGASTRODUODENOSCOPY N/A 06/21/2017   Procedure: ESOPHAGOGASTRODUODENOSCOPY (EGD);  Surgeon: Danie Binder, MD;  Location: [facility];  Service: Endoscopy;  Laterality: N/A;  . EUS N/A 07/28/2017   Procedure: UPPER ENDOSCOPIC ULTRASOUND (EUS) RADIAL;  Surgeon: [name], MD;  Location: [facility];  Service: Endoscopy;  Laterality: N/A;  . EYE SURGERY Bilateral    cataract  . JEJUNOSTOMY N/A 08/03/2017   Procedure: OPEN JEJUNOSTOMY TUBE PLACEMENT;  Surgeon: Virl Cagey, MD;  Location: [facility] ORS;  Service: General;  Laterality: N/A;  . PORTACATH PLACEMENT N/A 08/03/2017   Procedure: INSERTION PORT-A-CATH;  Surgeon: Virl Cagey, MD;  Location: [facility];  Service: General;  Laterality: N/A;     SOCIAL HISTORY:  Social History   Socioeconomic History  . Marital status: Married    Spouse name: Not on file  . Number of children: Not on file  . Years of education: Not on file  . Highest education level: Not on file  Occupational History  . Not on file  Social Needs  .  Financial resource strain: Not on file  . Food insecurity:    Worry: Not on file    Inability: Not on file  . Transportation needs:    Medical: Not on file    Non-medical: Not on file  Tobacco Use  . Smoking status: Current Every Day Smoker    Packs/day: 1.00    Types: Cigarettes  . Smokeless tobacco: Never Used  Substance and Sexual Activity  . Alcohol use: Yes    Comment: occ:1-2X/WEEK-BEER  . Drug use: Never  . Sexual activity: Not on file  Lifestyle  . Physical activity:    Days per week: Not on file    Minutes per session: Not on file  . Stress: Not on file    Relationships  . Social connections:    Talks on phone: Not on file    Gets together: Not on file    Attends religious service: Not on file    Active member of club or organization: Not on file    Attends meetings of clubs or organizations: Not on file    Relationship status: Not on file  . Intimate partner violence:    Fear of current or ex partner: Not on file    Emotionally abused: Not on file    Physically abused: Not on file    Forced sexual activity: Not on file  Other Topics Concern  . Not on file  Social History Narrative  . Not on file    FAMILY HISTORY:  Family History  Problem Relation Age of Onset  . Hypertension Mother   . Hypertension Father   . Diabetes Father   . Colon cancer Neg Hx   . Colon polyps Neg Hx     CURRENT MEDICATIONS:  Outpatient Encounter Medications as of 08/30/2017  Medication Sig  . atorvastatin (LIPITOR) 40 MG tablet Take 40 mg by mouth daily.   . bisacodyl (DULCOLAX) 10 MG suppository Place 1 suppository (10 mg total) rectally daily as needed for moderate constipation.  . cyclobenzaprine (FLEXERIL) 5 MG tablet Take 1 tablet (5 mg total) by mouth 3 (three) times daily as needed for muscle spasms.  Marland Kitchen docusate (COLACE) 50 MG/5ML liquid Place 10 mLs (100 mg total) into feeding tube 2 (two) times daily.  . famotidine (PEPCID) 40 MG/5ML suspension Place 2.5 mLs (20 mg total) into feeding tube 2 (two) times daily.  Marland Kitchen lidocaine-prilocaine (EMLA) cream Apply to affected area once  . metoCLOPramide (REGLAN) 5 MG/5ML solution Place 5 mLs (5 mg total) into feeding tube 4 (four) times daily -  before meals and at bedtime.  . Nutritional Supplements (FEEDING SUPPLEMENT, OSMOLITE 1.5 CAL,) LIQD Give osmolite 1.5 at 129ml/hr for 12 hours via continuous pump through J-tube per day.  Start at rate of 161ml/hr for 12 hours and increase by 41ml daily until goal rate of 186ml reached.  . ondansetron (ZOFRAN-ODT) 4 MG disintegrating tablet Take 1 tablet (4 mg  total) by mouth every 6 (six) hours as needed for nausea or vomiting.  Marland Kitchen oxyCODONE (ROXICODONE INTENSOL) 20 MG/ML concentrated solution Place 0.3 mLs (6 mg total) into feeding tube every 4 (four) hours as needed for severe pain or breakthrough pain.  Marland Kitchen prochlorperazine (COMPAZINE) 10 MG tablet Take 1 tablet (10 mg total) by mouth every 6 (six) hours as needed for nausea or vomiting.  . sucralfate (CARAFATE) 1 GM/10ML suspension Take 10 mLs (1 g total) by mouth 4 (four) times daily -  with meals and at bedtime.  Marland Kitchen  verapamil (CALAN) 80 MG tablet Place 1 tablet (80 mg total) into feeding tube every 8 (eight) hours. Crush med to fine powder and mix with 15-30 cc of water and give through the tube.  Marland Kitchen Zolpidem Tartrate 5 MG/ACT SOLN Take 1 Act (5 mg total) by mouth at bedtime.   No facility-administered encounter medications on file as of 08/30/2017.     ALLERGIES:  Allergies  Allergen Reactions  . Adhesive [Tape] Other (See Comments)    Paper tape only     PHYSICAL EXAM:  ECOG Performance status: 1  I have reviewed his vitals.  Blood pressure is 121/77.  Pulse rate is 94.  Respiratory rate is 18.  Temperature 98.2.  Saturations are 96. Physical Exam  His jejunostomy tube site is within normal limits.  No signs of infection. LABORATORY DATA:  I have reviewed the labs as listed.  CBC    Component Value Date/Time   WBC 9.4 08/30/2017 1013   RBC 5.00 08/30/2017 1013   HGB 15.6 08/30/2017 1013   HCT 45.7 08/30/2017 1013   PLT 253 08/30/2017 1013   MCV 91.4 08/30/2017 1013   MCH 31.2 08/30/2017 1013   MCHC 34.1 08/30/2017 1013   RDW 12.8 08/30/2017 1013   LYMPHSABS 3.0 08/30/2017 1013   MONOABS 0.9 08/30/2017 1013   EOSABS 0.1 08/30/2017 1013   BASOSABS 0.0 08/30/2017 1013   CMP Latest Ref Rng & Units 08/30/2017 08/11/2017 08/10/2017  Glucose 70 - 99 mg/dL 113(H) 108(H) 106(H)  BUN 6 - 20 mg/dL 20 16 24(H)  Creatinine 0.61 - 1.24 mg/dL 0.77 0.87 1.04  Sodium 135 - 145 mmol/L 140 141  142  Potassium 3.5 - 5.1 mmol/L 4.5 3.9 4.3  Chloride 98 - 111 mmol/L 102 105 100(L)  CO2 22 - 32 mmol/L 28 29 34(H)  Calcium 8.9 - 10.3 mg/dL 9.3 8.4(L) 8.5(L)  Total Protein 6.5 - 8.1 g/dL 7.2 - -  Total Bilirubin 0.3 - 1.2 mg/dL 0.4 - -  Alkaline Phos 38 - 126 U/L 104 - -  AST 15 - 41 U/L 24 - -  ALT 0 - 44 U/L 40 - -       DIAGNOSTIC IMAGING:   I have independently reviewed CT scan of the abdomen and pelvis dated 08/09/2017.    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  GE junction poorly differentiated adenocarcinoma (uT3uNx): - EGD on 06/21/2017 shows benign-appearing stenosis which was dilated in the distal esophagus.  Localized prominent gastric folds were found in the cardia, which was biopsied.  There was also localized mild inflammation with congestion and erythema in the antrum, biopsies were taken for H. pylori testing. - Stomach biopsy shows chronic gastritis with H. pylori positive.  Esophageal gastric junction biopsy shows poorly differentiated adenocarcinoma. -I have discussed the findings of the pathology report with the patient and his wife in detail. -PET CT scan dated 07/12/2017 showed hypermetabolic uptake in the distal esophagus, extending into GE junction.  The segment is approximately 4.2 cm Hottenstein. - On 07/28/2017 EGD/EUS done by Dr. Edison Nasuti showed circumferential tight, nearly obstructing stricture at GE junction, 1.3 cm thick, with the lumen of 1 to 2 mm.  UT3 NX. -Jejunostomy tube was placed on 08/03/2017.  He lost about 12 pounds since the diagnosis.  He is receiving tube feeds via pump 10 hours at nighttime, at 140 mL/h.  He is tolerating them very well.  He has not lost any weight in the last couple of weeks. -  He started radiation therapy this morning.  He will receive his first weekly carboplatin and paclitaxel today.  We discussed about the side effects in detail.  We will see him back in 1 week for follow-up.      Orders placed this encounter:   No orders of the defined types were placed in this encounter.     Phillip Jack, MD [place] [phone]

## 2017-08-30 NOTE — Progress Notes (Signed)
Nutrition Follow-up 62 y/o male PMHx HTN/HLD, OSA. Abruptly developed dysphagia to meats end of April and presented to ED on 4/30 S/P egd, which did not show mass, but GEJ biopsy  + for adenocarcinoma. Referred to Oncology. Full workup revealed limited disease. During workup, developed severe, rapidly progressive dysphagia requiring hospitalization for cath placement and open J tube insertion. Started TF. Pt begian chemoradiation as tentative bridge to surgery on 7/9.   Patient and wife seen today at pts first chemo infusion. He had his first radiation tx this morning. He says it went well.   Were last seen in hospital ~3 weeks ago, though had spoken on phone with 2 weeks ago. He was 180 lbs when spoke to 6/27. He is now 174.3.   Though charted wt history would indicate that pt is progressively losing weight, Pt/spouse state that he weighs himself daily and has been 174 for 4-5 days now. They actually report that he was 171 lbs at one time, but has "gained a few lbs".  Current tube feeding regimen is as follows. They administer 7 cans of Osmolite 1.5 at a rate of 140/hr. They flush with 1200-1500 mls/day (300cc boluses every 3-4 hrs). This provides: 2485 kcals, 104.3g Pro, 1267 ml free water (+1.2-1.5 L from flushes).   They report the infusion of 7 cans at 140cc/hr only takes about 10 hrs? Though, calculations show this would take 11.85 hrs. They are sure they start   at 5pm ,with only a couple cans, and then add more at 9:30. The tube feed typically runs out at about 4 in the morning. They are certain he receives a total of 7 cans. It sounds that pump is not correctly collaborated and he must be actually receiving at a rate >140/hr. He does experience nausea shortly after starting the TF, but this goes away "after a little bit". He takes compazine regularly to manage his nausea. He is no longer taking the reglan.   He still is unable to take anything by mouth. However, he says He "experimented". He  drank some coffee and "felt like it went to my stomach", so he then tried pudding and "also felt like it went to my stomach". However, shortly after, he had stomach cramping and eventually threw up these items.   Spouse has several miscellaneous questions about feeding and the tube itself.   Wt Readings from Last 10 Encounters:  08/30/17 174 lb 4.8 oz (79.1 kg)  08/19/17 179 lb (81.2 kg)  08/19/17 179 lb (81.2 kg)  08/10/17 186 lb 4.6 oz (84.5 kg)  08/01/17 195 lb 6.4 oz (88.6 kg)  07/28/17 199 lb (90.3 kg)  07/26/17 199 lb (90.3 kg)  07/13/17 200 lb (90.7 kg)  07/04/17 201 lb 12.8 oz (91.5 kg)  06/21/17 205 lb (93 kg)   MEDICATIONS:  Chemo: taxol, carboplatin w/ decadron Supportive meds: Dulcolax, ambien, carafate, compazine, zofran, oxycodone, h2ra, colace, flexeril.   LABS:  Unremarkable, Albumin: 3.7  Recent Labs  Lab 08/30/17 1013  NA 140  K 4.5  CL 102  CO2 28  BUN 20  CREATININE 0.77  CALCIUM 9.3  GLUCOSE 113*    ANTHROPOMETRICS: Height:  Ht Readings from Last 1 Encounters:  08/19/17 5\' 10"  (1.778 m)   Weight:  Wt Readings from Last 1 Encounters:  08/30/17 174 lb 4.8 oz (79.1 kg)   BMI:  BMI Readings from Last 1 Encounters:  08/30/17 25.01 kg/m   Wt change. Down 5 lbs x 1.5 weeks in chart.  However, pt/spouse state wt stable for 4-5 days.  ESTIMATED ENERGY NEEDS:  Kcal: 2400-2600 (30-33 kcal/kg bw) Protein:105-120g Pro (1.3-1.5 g/kg bw) Fluid: >2.4 L/day (1 ml/kcal)   NUTRITION - FOCUSED PHYSICAL EXAM: WDL  NUTRITION DIAGNOSIS:  Increased nutrient needs related to cancer and cancer related treatments as evidenced by estimated nutritional requirements for this condition and tx   DOCUMENTATION CODES:  Not applicable  INTERVENTION:  As stated above, though chart hx suggest continued wt loss, spouse/pt report stability 4-5 days. Pt weighs himself daily and they are quite capable of adjusting the TF/fluids as they need to.They even ask today if  they could go higher on the rate. They can increase as high as he tolerates.   We discussed numerous topics foods including: expected timetable of reintroduction of foods, what he should eat when he is able, how to manage any chemo related side effects, how much tube can protrude from body before they can be concerned etc. RD deferred several questions to the surgeon, with whom they see this Thursday.   Patient seems to be doing well on current regimen and it is meeting his estimated needs. No changes need to be made at this time.   RD will follow up x 1 week.   GOAL:  Patient will meet greater than or equal to 90% of their needs  MONITOR:  PO intake, Labs, Weight trends, TF tolerance, Diet advancement, Supplement acceptance  Next Visit: 1 week  Burtis Junes RD, LDN, CNSC Clinical Nutrition Available Tues-Sat via Pager: [phone] 08/30/2017 11:57 AM

## 2017-08-30 NOTE — Progress Notes (Signed)
1325 - Called to bedside by pt's spouse. Pt reports feeling flushed, hot, and nauseated.  Taxol infusion stopped and NS infusing at 999 ml/h.  Dr. Delton Coombes to bedside.  VSS.  Orders rec'd for Solumedrol 100 mg IV, and to restart Taxol infusion approx 15 min post Solumedrol administration. Pt denies flushing/feeling hot, nausea after infusion stopped.   1359 - Taxol infusion restarted at 63 ml/h and titrated up to the max rate to infuse remainder of medication. Pt tolerated w/o adverse reaction.  Tolerated carboplatin infusion w/o adverse reaction.  Alert, in no distress.  VSS.  Discharged ambulatory in c/o spouse.

## 2017-08-30 NOTE — Assessment & Plan Note (Signed)
1.  GE junction poorly differentiated adenocarcinoma (uT3uNx): - EGD on 06/21/2017 shows benign-appearing stenosis which was dilated in the distal esophagus.  Localized prominent gastric folds were found in the cardia, which was biopsied.  There was also localized mild inflammation with congestion and erythema in the antrum, biopsies were taken for H. pylori testing. - Stomach biopsy shows chronic gastritis with H. pylori positive.  Esophageal gastric junction biopsy shows poorly differentiated adenocarcinoma. -I have discussed the findings of the pathology report with the patient and his wife in detail. -PET CT scan dated 07/12/2017 showed hypermetabolic uptake in the distal esophagus, extending into GE junction.  The segment is approximately 4.2 cm Kr[place]. - On 07/28/2017 EGD/EUS done by Dr. Edison Nasuti showed circumferential tight, nearly obstructing stricture at GE junction, 1.3 cm thick, with the lumen of 1 to 2 mm.  UT3 NX. -Jejunostomy tube was placed on 08/03/2017.  He lost about 12 pounds since the diagnosis.  He is receiving tube feeds via pump 10 hours at nighttime, at 140 mL/h.  He is tolerating them very well.  He has not lost any weight in the last couple of weeks. - He started radiation therapy this morning.  He will receive his first weekly carboplatin and paclitaxel today.  We discussed about the side effects in detail.  We will see him back in 1 week for follow-up.

## 2017-08-31 ENCOUNTER — Telehealth (HOSPITAL_COMMUNITY): Payer: Self-pay | Admitting: *Deleted

## 2017-08-31 NOTE — Telephone Encounter (Signed)
Contacted pt's wife for 24 hour f/u post first chemotherapy tx yesterday.  She reports he was nauseated and weak last night, but states he is feeling well today with no complaints.  Instructed to contact the clinic should anything change.  Verbalizes understanding.

## 2017-09-01 ENCOUNTER — Ambulatory Visit (INDEPENDENT_AMBULATORY_CARE_PROVIDER_SITE_OTHER): Payer: Self-pay | Admitting: General Surgery

## 2017-09-01 ENCOUNTER — Encounter: Payer: Self-pay | Admitting: General Surgery

## 2017-09-01 VITALS — BP 139/79 | HR 96 | Temp 97.8°F | Resp 18 | Wt 178.0 lb

## 2017-09-01 DIAGNOSIS — C160 Malignant neoplasm of cardia: Secondary | ICD-10-CM

## 2017-09-01 NOTE — Progress Notes (Signed)
[facility] Note   HPI:  62 y.o. Male presents to clinic for post-op follow-up evaluation of after port and jejunostomy tube placement. Patient reports he is doing well. He is tolerating 140cc of feeds for 10 hrs at night. He has started his radiation and chemotherapy. He saw Dr. Servando Snare and has follow up in August.    Review of Systems:  NO fevers or chills Tolerating tube feeds Not taking in anything by mouth, tried with vomiting a popsicle last week All other review of systems: otherwise negative   Vital Signs:  BP 139/79 (BP Location: Left Arm, Patient Position: Sitting, Cuff Size: Normal)   Pulse 96   Temp 97.8 F (36.6 C) (Temporal)   Resp 18   Wt 178 lb (80.7 kg)   BMI 25.54 kg/m    Physical Exam:  Physical Exam  Constitutional: He appears well-developed.  HENT:  Head: Normocephalic.  Eyes: Pupils are equal, round, and reactive to light.  Cardiovascular: Normal rate.  Pulmonary/Chest: Effort normal.  Abdominal: Soft. He exhibits no distension. There is no tenderness.  Incision healing, j tube in place, no drainage or skin changes, bumper in location as expected  Vitals reviewed. Port site healed with no sign of infection   Laboratory studies: None   Imaging:  None    Assessment:  62 y.o. yo Male with gastroesophageal cancer s/p port and jejunostomy tube. He is doing well.  Plan:  - Activity as tolerated, no lifting 10 lbs for 6 weeks post op - Follow up PRN    All of the above recommendations were discussed with the patient and patient's family, and all of patient's and family's questions were answered to their expressed satisfaction.  Curlene Labrum, MD [facility] [address] [place], [place] [id number] [phone] (office)

## 2017-09-01 NOTE — Patient Instructions (Signed)
Activity as tolerated. Driving is ok as [place] as you can slam on the breaks and are not taking narcotics/ pain medication.

## 2017-09-06 ENCOUNTER — Inpatient Hospital Stay (HOSPITAL_BASED_OUTPATIENT_CLINIC_OR_DEPARTMENT_OTHER): Payer: BLUE CROSS/BLUE SHIELD | Admitting: Hematology

## 2017-09-06 ENCOUNTER — Encounter (HOSPITAL_COMMUNITY): Payer: Self-pay | Admitting: Hematology

## 2017-09-06 ENCOUNTER — Inpatient Hospital Stay (HOSPITAL_COMMUNITY): Payer: BLUE CROSS/BLUE SHIELD

## 2017-09-06 ENCOUNTER — Inpatient Hospital Stay (HOSPITAL_COMMUNITY): Payer: BLUE CROSS/BLUE SHIELD | Admitting: Dietician

## 2017-09-06 ENCOUNTER — Other Ambulatory Visit: Payer: Self-pay

## 2017-09-06 VITALS — BP 128/78 | HR 76 | Temp 97.9°F | Resp 18

## 2017-09-06 VITALS — BP 117/78 | HR 90 | Temp 97.9°F | Resp 18 | Wt 174.2 lb

## 2017-09-06 DIAGNOSIS — F17210 Nicotine dependence, cigarettes, uncomplicated: Secondary | ICD-10-CM

## 2017-09-06 DIAGNOSIS — C160 Malignant neoplasm of cardia: Secondary | ICD-10-CM

## 2017-09-06 DIAGNOSIS — K5900 Constipation, unspecified: Secondary | ICD-10-CM

## 2017-09-06 DIAGNOSIS — R634 Abnormal weight loss: Secondary | ICD-10-CM | POA: Diagnosis not present

## 2017-09-06 DIAGNOSIS — B9681 Helicobacter pylori [H. pylori] as the cause of diseases classified elsewhere: Secondary | ICD-10-CM

## 2017-09-06 DIAGNOSIS — I10 Essential (primary) hypertension: Secondary | ICD-10-CM

## 2017-09-06 DIAGNOSIS — Z934 Other artificial openings of gastrointestinal tract status: Secondary | ICD-10-CM

## 2017-09-06 DIAGNOSIS — Z79899 Other long term (current) drug therapy: Secondary | ICD-10-CM

## 2017-09-06 DIAGNOSIS — E785 Hyperlipidemia, unspecified: Secondary | ICD-10-CM

## 2017-09-06 DIAGNOSIS — G4733 Obstructive sleep apnea (adult) (pediatric): Secondary | ICD-10-CM

## 2017-09-06 DIAGNOSIS — R110 Nausea: Secondary | ICD-10-CM

## 2017-09-06 DIAGNOSIS — R1319 Other dysphagia: Secondary | ICD-10-CM | POA: Diagnosis not present

## 2017-09-06 DIAGNOSIS — R5383 Other fatigue: Secondary | ICD-10-CM

## 2017-09-06 DIAGNOSIS — K2950 Unspecified chronic gastritis without bleeding: Secondary | ICD-10-CM

## 2017-09-06 LAB — COMPREHENSIVE METABOLIC PANEL
ALK PHOS: 93 U/L (ref 38–126)
ALT: 31 U/L (ref 0–44)
ANION GAP: 10 (ref 5–15)
AST: 25 U/L (ref 15–41)
Albumin: 3.6 g/dL (ref 3.5–5.0)
BILIRUBIN TOTAL: 0.6 mg/dL (ref 0.3–1.2)
BUN: 19 mg/dL (ref 6–20)
CALCIUM: 8.9 mg/dL (ref 8.9–10.3)
CO2: 27 mmol/L (ref 22–32)
Chloride: 100 mmol/L (ref 98–111)
Creatinine, Ser: 0.72 mg/dL (ref 0.61–1.24)
GFR calc non Af Amer: >60 mL/min (ref >60)
Glucose, Bld: 102 mg/dL — ABNORMAL HIGH (ref 70–99)
Potassium: 4.3 mmol/L (ref 3.5–5.1)
SODIUM: 137 mmol/L (ref 135–145)
Total Protein: 7 g/dL (ref 6.5–8.1)

## 2017-09-06 LAB — CBC WITH DIFFERENTIAL/PLATELET
BASOS PCT: 1 %
Basophils Absolute: 0 10*3/uL (ref 0.0–0.1)
EOS ABS: 0.1 10*3/uL (ref 0.0–0.7)
Eosinophils Relative: 1 %
HCT: 42 % (ref 39.0–52.0)
HEMOGLOBIN: 14.2 g/dL (ref 13.0–17.0)
Lymphocytes Relative: 14 %
Lymphs Abs: 0.9 10*3/uL (ref 0.7–4.0)
MCH: 30.8 pg (ref 26.0–34.0)
MCHC: 33.8 g/dL (ref 30.0–36.0)
MCV: 91.1 fL (ref 78.0–100.0)
MONO ABS: 0.5 10*3/uL (ref 0.1–1.0)
MONOS PCT: 8 %
NEUTROS ABS: 4.8 10*3/uL (ref 1.7–7.7)
NEUTROS PCT: 76 %
Platelets: 226 10*3/uL (ref 150–400)
RBC: 4.61 MIL/uL (ref 4.22–5.81)
RDW: 12.8 % (ref 11.5–15.5)
WBC: 6.3 10*3/uL (ref 4.0–10.5)

## 2017-09-06 MED ORDER — SODIUM CHLORIDE 0.9 % IV SOLN
Freq: Once | INTRAVENOUS | Status: AC
  Administered 2017-09-06: 12:00:00 via INTRAVENOUS

## 2017-09-06 MED ORDER — DEXAMETHASONE SODIUM PHOSPHATE 100 MG/10ML IJ SOLN
20.0000 mg | Freq: Once | INTRAMUSCULAR | Status: AC
  Administered 2017-09-06: 20 mg via INTRAVENOUS
  Filled 2017-09-06: qty 2

## 2017-09-06 MED ORDER — DIPHENHYDRAMINE HCL 50 MG/ML IJ SOLN
50.0000 mg | Freq: Once | INTRAMUSCULAR | Status: AC
  Administered 2017-09-06: 50 mg via INTRAVENOUS
  Filled 2017-09-06: qty 1

## 2017-09-06 MED ORDER — DIPHENHYDRAMINE HCL 50 MG/ML IJ SOLN
INTRAMUSCULAR | Status: AC
  Filled 2017-09-06: qty 1

## 2017-09-06 MED ORDER — PALONOSETRON HCL INJECTION 0.25 MG/5ML
0.2500 mg | Freq: Once | INTRAVENOUS | Status: AC
  Administered 2017-09-06: 0.25 mg via INTRAVENOUS
  Filled 2017-09-06: qty 5

## 2017-09-06 MED ORDER — HEPARIN SOD (PORK) LOCK FLUSH 100 UNIT/ML IV SOLN
500.0000 [IU] | Freq: Once | INTRAVENOUS | Status: AC
  Administered 2017-09-06: 500 [IU] via INTRAVENOUS

## 2017-09-06 MED ORDER — HEPARIN SOD (PORK) LOCK FLUSH 100 UNIT/ML IV SOLN
INTRAVENOUS | Status: AC
  Filled 2017-09-06: qty 5

## 2017-09-06 MED ORDER — SODIUM CHLORIDE 0.9 % IV SOLN
50.0000 mg/m2 | Freq: Once | INTRAVENOUS | Status: AC
  Administered 2017-09-06: 102 mg via INTRAVENOUS
  Filled 2017-09-06: qty 17

## 2017-09-06 MED ORDER — FAMOTIDINE IN NACL 20-0.9 MG/50ML-% IV SOLN
20.0000 mg | Freq: Once | INTRAVENOUS | Status: AC
  Administered 2017-09-06: 20 mg via INTRAVENOUS
  Filled 2017-09-06: qty 50

## 2017-09-06 MED ORDER — SODIUM CHLORIDE 0.9 % IV SOLN
299.2000 mg | Freq: Once | INTRAVENOUS | Status: AC
  Administered 2017-09-06: 300 mg via INTRAVENOUS
  Filled 2017-09-06: qty 30

## 2017-09-06 NOTE — Patient Instructions (Signed)
[facility] Discharge Instructions for Patients Receiving Chemotherapy  Today you received the following chemotherapy agents taxol and carboplatin   If you develop nausea and vomiting that is not controlled by your nausea medication, call the clinic.   BELOW ARE SYMPTOMS THAT SHOULD BE REPORTED IMMEDIATELY:  *FEVER GREATER THAN 100.5 F  *CHILLS WITH OR WITHOUT FEVER  NAUSEA AND VOMITING THAT IS NOT CONTROLLED WITH YOUR NAUSEA MEDICATION  *UNUSUAL SHORTNESS OF BREATH  *UNUSUAL BRUISING OR BLEEDING  TENDERNESS IN MOUTH AND THROAT WITH OR WITHOUT PRESENCE OF ULCERS  *URINARY PROBLEMS  *BOWEL PROBLEMS  UNUSUAL RASH Items with * indicate a potential emergency and should be followed up as soon as possible.  Feel free to call the clinic should you have any questions or concerns. The clinic phone number is [phone].  Please show the CHEMO ALERT CARD at check-in to the Emergency Department and triage nurse.   

## 2017-09-06 NOTE — Patient Instructions (Signed)
[facility] at [facility] Discharge Instructions  Today you saw Dr. K.   Thank you for choosing [facility] at [facility] to provide your oncology and hematology care.  To afford each patient quality time with our provider, please arrive at least 15 minutes before your scheduled appointment time.   If you have a lab appointment with the Cancer Center please come in thru the  Main Entrance and check in at the main information desk  You need to re-schedule your appointment should you arrive 10 or more minutes late.  We strive to give you quality time with our providers, and arriving late affects you and other patients whose appointments are after yours.  Also, if you no show three or more times for appointments you may be dismissed from the clinic at the providers discretion.     Again, thank you for choosing [facility].  Our hope is that these requests will decrease the amount of time that you wait before being seen by our physicians.       _____________________________________________________________  Should you have questions after your visit to [facility], please contact our office at [phone] between the hours of 8:30 a.m. and 4:30 p.m.  Voicemails left after 4:30 p.m. will not be returned until the following business day.  For prescription refill requests, have your pharmacy contact our office.       Resources For Cancer Patients and their Caregivers ? American Cancer Society: Can assist with transportation, wigs, general needs, runs Look Good Feel Better.        1-888-227-6333 ? Cancer Care: Provides financial assistance, online support groups, medication/co-pay assistance.  1-800-813-HOPE (4673) ? [facility] Assists [place] cancer patients and their families through emotional , educational and financial support.  336-427-4357 ? [place] DSS Where to apply for food  stamps, Medicaid and utility assistance. 336-342-1394 ? [facility]: Transportation to medical appointments. 336-347-2287 ? Social Security Administration: May apply for disability if have a Stage IV cancer. 336-342-7796 1-800-772-1213 ? [place] Aging, Disability and Transit Services: Assists with nutrition, care and transit needs. 336-349-2343  Cancer Center Support Programs:   > Cancer Support Group  2nd Tuesday of the month 1pm-2pm, Journey Room   > Creative Journey  3rd Tuesday of the month 1130am-1pm, Journey Room    

## 2017-09-06 NOTE — Assessment & Plan Note (Signed)
1.  GE junction poorly differentiated adenocarcinoma (uT3uNx): - EGD on 06/21/2017 shows benign-appearing stenosis which was dilated in the distal esophagus.  Localized prominent gastric folds were found in the cardia, which was biopsied.  There was also localized mild inflammation with congestion and erythema in the antrum, biopsies were taken for H. pylori testing. - Stomach biopsy shows chronic gastritis with H. pylori positive.  Esophageal gastric junction biopsy shows poorly differentiated adenocarcinoma. -I have discussed the findings of the pathology report with the patient and his wife in detail. -PET CT scan dated 07/12/2017 showed hypermetabolic uptake in the distal esophagus, extending into GE junction.  The segment is approximately 4.2 cm Amiri. - On 07/28/2017 EGD/EUS done by Dr. Edison Nasuti showed circumferential tight, nearly obstructing stricture at GE junction, 1.3 cm thick, with the lumen of 1 to 2 mm.  UT3 NX. -Jejunostomy tube was placed on 08/03/2017.  He lost about 12 pounds since the diagnosis.  He is receiving tube feeds via pump over more than 12 hours at nighttime, at 110-140 mL/h.  He is tolerating them very well.  He has not lost any weight in the last couple of weeks. - Radiation with first week of carbotaxol started on 08/30/2017. -He felt slightly nauseous and weak on the day of chemotherapy.  The next day he was back to his baseline.  His weight is more or less stable.  He will proceed with cycle 2 today.  His blood counts are adequate to proceed without any dose modification.

## 2017-09-06 NOTE — Progress Notes (Signed)
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[place])      08/02/2017 -  Chemotherapy    The patient had palonosetron (ALOXI) injection 0.25 mg, 0.25 mg, Intravenous,  Once, 1 of 1 cycle Administration: 0.25 mg (08/30/2017) CARBOplatin (PARAPLATIN) 300 mg in sodium chloride 0.9 % 250 mL chemo infusion, 300 mg (100 % of original dose 299.2 mg), Intravenous,  Once, 1 of 1 cycle Dose modification:   (original dose 299.2 mg, Cycle 1),   (Cycle 1) Administration: 300 mg (08/30/2017) PACLitaxel (TAXOL) 102 mg in sodium chloride 0.9 % 250 mL chemo infusion (</= 80mg /m2), 50 mg/m2 = 102 mg, Intravenous,  Once, 1 of 1 cycle Administration: 102 mg (08/30/2017)  for chemotherapy treatment.         INTERVAL HISTORY:  Mr. Desa 62 y.o. male returns for routine follow-up of GE junction adenocarcinoma and consideration for 2nd cycle of chemotherapy. Patient has PEG tube and is receiving tube feeding daily. He is up to 7 cans daily. Patient has been having bloating with the tube feeding and has been taking imodium which has helped. Patient is tolerating chemo well. He did have nausea with the last treatment last week. He had vomiting several episodes. He took zofran that night which didn't seem to help much. The next day he felt a lot better no nausea. Patient denies any diarrhea however has constipation from time to time which is relieved with softeners. Denies any new pains. Denies any numbness or tingling. His energy level is remaining good at 75%.     REVIEW OF SYSTEMS:  Review of Systems    Constitutional: Positive for fatigue.  HENT:   Positive for trouble swallowing.   Eyes: Negative.   Respiratory: Negative.   Cardiovascular: Negative.   Gastrointestinal: Negative.   Endocrine: Negative.   Genitourinary: Negative.    Musculoskeletal: Negative.   Skin: Negative.   Neurological: Negative.   Hematological: Negative.   Psychiatric/Behavioral: Negative.      PAST MEDICAL/SURGICAL HISTORY:  Past Medical History:  Diagnosis Date  . Hyperlipidemia   . Hypertension   . Sleep apnea, obstructive    Past Surgical History:  Procedure Laterality Date  . colonoscopy     [place] 89 polyps removed, age 67: ? POLYPS, AGE 71: ? POLYPS  . ESOPHAGEAL DILATION N/A 06/21/2017   Procedure: ESOPHAGEAL DILATION;  Surgeon: Danie Binder, MD;  Location[facility];  Service: Endoscopy;  Laterality: N/A;  . ESOPHAGOGASTRODUODENOSCOPY N/A 06/21/2017   Procedure: ESOPHAGOGASTRODUODENOSCOPY (EGD);  Surgeon: Danie Binder, MD;  Location: [facility];  Service: Endoscopy;  Laterality: N/A;  . EUS N/A 07/28/2017   Procedure: UPPER ENDOSCOPIC ULTRASOUND (EUS) RADIAL;  Surgeon: [name], MD;  Location: [facility];  Service: Endoscopy;  Laterality: N/A;  . EYE SURGERY Bilateral    cataract  . JEJUNOSTOMY N/A 08/03/2017   Procedure: OPEN JEJUNOSTOMY TUBE PLACEMENT;  Surgeon: Virl Cagey, MD;  Location: [facility] ORS;  Service: General;  Laterality: N/A;  . PORTACATH PLACEMENT N/A 08/03/2017   Procedure: INSERTION PORT-A-CATH;  Surgeon: Virl Cagey, MD;  Location: [facility]  ORS;  Service: General;  Laterality: N/A;     SOCIAL HISTORY:  Social History   Socioeconomic History  . Marital status: Married    Spouse name: Not on file  . Number of children: Not on file  . Years of education: Not on file  . Highest education level: Not on file  Occupational History  . Not on file  Social Needs  . Financial resource strain: Not on file  . Food insecurity:    Worry: Not  on file    Inability: Not on file  . Transportation needs:    Medical: Not on file    Non-medical: Not on file  Tobacco Use  . Smoking status: Current Every Day Smoker    Packs/day: 1.00    Types: Cigarettes  . Smokeless tobacco: Never Used  Substance and Sexual Activity  . Alcohol use: Yes    Comment: occ:1-2X/WEEK-BEER  . Drug use: Never  . Sexual activity: Not on file  Lifestyle  . Physical activity:    Days per week: Not on file    Minutes per session: Not on file  . Stress: Not on file  Relationships  . Social connections:    Talks on phone: Not on file    Gets together: Not on file    Attends religious service: Not on file    Active member of club or organization: Not on file    Attends meetings of clubs or organizations: Not on file    Relationship status: Not on file  . Intimate partner violence:    Fear of current or ex partner: Not on file    Emotionally abused: Not on file    Physically abused: Not on file    Forced sexual activity: Not on file  Other Topics Concern  . Not on file  Social History Narrative  . Not on file    FAMILY HISTORY:  Family History  Problem Relation Age of Onset  . Hypertension Mother   . Hypertension Father   . Diabetes Father   . Colon cancer Neg Hx   . Colon polyps Neg Hx     CURRENT MEDICATIONS:  Outpatient Encounter Medications as of 09/06/2017  Medication Sig  . acetaminophen (TYLENOL) 160 MG/5ML liquid Take by mouth every 4 (four) hours as needed for fever.  Marland Kitchen atorvastatin (LIPITOR) 40 MG tablet Take 40 mg by mouth daily.   . bisacodyl (DULCOLAX) 10 MG suppository Place 1 suppository (10 mg total) rectally daily as needed for moderate constipation.  . cyclobenzaprine (FLEXERIL) 5 MG tablet Take 1 tablet (5 mg total) by mouth 3 (three) times daily as needed for muscle spasms.  Marland Kitchen docusate (COLACE) 50 MG/5ML liquid Place 10 mLs (100 mg total) into feeding tube 2 (two) times daily.  . famotidine (PEPCID) 40 MG/5ML  suspension Place 2.5 mLs (20 mg total) into feeding tube 2 (two) times daily.  Marland Kitchen lidocaine-prilocaine (EMLA) cream Apply to affected area once  . Loperamide HCl (IMODIUM A-D) 1 MG/7.5ML LIQD Take by mouth.  . metoCLOPramide (REGLAN) 5 MG/5ML solution Place 5 mLs (5 mg total) into feeding tube 4 (four) times daily -  before meals and at bedtime.  . Nutritional Supplements (FEEDING SUPPLEMENT, OSMOLITE 1.5 CAL,) LIQD Give osmolite 1.5 at 112ml/hr for 12 hours via continuous pump through J-tube per day.  Start at rate of 148ml/hr for 12 hours and increase by 17ml daily until goal rate of 156ml reached.  . ondansetron (ZOFRAN-ODT) 4 MG disintegrating tablet Take 1  tablet (4 mg total) by mouth every 6 (six) hours as needed for nausea or vomiting.  Marland Kitchen oxyCODONE (ROXICODONE INTENSOL) 20 MG/ML concentrated solution Place 0.3 mLs (6 mg total) into feeding tube every 4 (four) hours as needed for severe pain or breakthrough pain.  Marland Kitchen prochlorperazine (COMPAZINE) 10 MG tablet Take 1 tablet (10 mg total) by mouth every 6 (six) hours as needed for nausea or vomiting.  . sucralfate (CARAFATE) 1 GM/10ML suspension Take 10 mLs (1 g total) by mouth 4 (four) times daily -  with meals and at bedtime.  . verapamil (CALAN) 80 MG tablet Place 1 tablet (80 mg total) into feeding tube every 8 (eight) hours. Crush med to fine powder and mix with 15-30 cc of water and give through the tube.  Marland Kitchen Zolpidem Tartrate 5 MG/ACT SOLN Take 1 Act (5 mg total) by mouth at bedtime.   No facility-administered encounter medications on file as of 09/06/2017.     ALLERGIES:  Allergies  Allergen Reactions  . Adhesive [Tape] Other (See Comments)    Paper tape only     PHYSICAL EXAM:  ECOG Performance status: 1  Vitals:   09/06/17 1133  BP: 117/78  Pulse: 90  Resp: 18  Temp: 97.9 F (36.6 C)  SpO2: 98%   Filed Weights   09/06/17 1133  Weight: 174 lb 3.2 oz (79 kg)    Physical Exam   LABORATORY DATA:  I have reviewed the  labs as listed.  CBC    Component Value Date/Time   WBC 6.3 09/06/2017 0932   RBC 4.61 09/06/2017 0932   HGB 14.2 09/06/2017 0932   HCT 42.0 09/06/2017 0932   PLT 226 09/06/2017 0932   MCV 91.1 09/06/2017 0932   MCH 30.8 09/06/2017 0932   MCHC 33.8 09/06/2017 0932   RDW 12.8 09/06/2017 0932   LYMPHSABS 0.9 09/06/2017 0932   MONOABS 0.5 09/06/2017 0932   EOSABS 0.1 09/06/2017 0932   BASOSABS 0.0 09/06/2017 0932   CMP Latest Ref Rng & Units 09/06/2017 08/30/2017 08/11/2017  Glucose 70 - 99 mg/dL 102(H) 113(H) 108(H)  BUN 6 - 20 mg/dL 19 20 16   Creatinine 0.61 - 1.24 mg/dL 0.72 0.77 0.87  Sodium 135 - 145 mmol/L 137 140 141  Potassium 3.5 - 5.1 mmol/L 4.3 4.5 3.9  Chloride 98 - 111 mmol/L 100 102 105  CO2 22 - 32 mmol/L 27 28 29   Calcium 8.9 - 10.3 mg/dL 8.9 9.3 8.4(L)  Total Protein 6.5 - 8.1 g/dL 7.0 7.2 -  Total Bilirubin 0.3 - 1.2 mg/dL 0.6 0.4 -  Alkaline Phos 38 - 126 U/L 93 104 -  AST 15 - 41 U/L 25 24 -  ALT 0 - 44 U/L 31 40 -        ASSESSMENT & PLAN:   Gastroesophageal cancer ([facility]) 1.  GE junction poorly differentiated adenocarcinoma ([id number]): - EGD on 06/21/2017 shows benign-appearing stenosis which was dilated in the distal esophagus.  Localized prominent gastric folds were found in the cardia, which was biopsied.  There was also localized mild inflammation with congestion and erythema in the antrum, biopsies were taken for H. pylori testing. - Stomach biopsy shows chronic gastritis with H. pylori positive.  Esophageal gastric junction biopsy shows poorly differentiated adenocarcinoma. -I have discussed the findings of the pathology report with the patient and his wife in detail. -PET CT scan dated 07/12/2017 showed hypermetabolic uptake in the distal esophagus, extending into GE junction.  The segment is approximately 4.2  cm Ore. - On 07/28/2017 EGD/EUS done by Dr. Edison Nasuti showed circumferential tight, nearly obstructing stricture at GE junction, 1.3 cm thick, with the  lumen of 1 to 2 mm.  UT3 NX. -Jejunostomy tube was placed on 08/03/2017.  He lost about 12 pounds since the diagnosis.  He is receiving tube feeds via pump over more than 12 hours at nighttime, at 110-140 mL/h.  He is tolerating them very well.  He has not lost any weight in the last couple of weeks. - Radiation with first week of carbotaxol started on 08/30/2017. -He felt slightly nauseous and weak on the day of chemotherapy.  The next day he was back to his baseline.  His weight is more or less stable.  He will proceed with cycle 2 today.  His blood counts are adequate to proceed without any dose modification.      Orders placed this encounter:  Orders Placed This Encounter  Procedures  . Magnesium  . CBC with Differential/Platelet  . Comprehensive metabolic panel      Derek Jack, MD [place] [phone]

## 2017-09-06 NOTE — Progress Notes (Signed)
Nutrition Follow-up 62 y/o male PMHx HTN/HLD, OSA. Abruptly developed dysphagia to meats end of April and presented to ED on 4/30. EGD and biopsy performed. Biopsy  + for adenocarcinoma and workup revealed limited disease. During workup, pt developed rapidly progressiing dysphagia, requiring hospitalization for open J tube insertion. Started TF. Pt begian chemoradiation as tentative bridge to surgery on 7/9.   Pt seen for for follow up of J-tube feeding  Patient reports experiencing quick onset of weakness and nausea after his chemo infusion last week, feeling it as he was leaving the cancer center. It continued through the course of the night and progressed to him "turning gray" and having dry heaves, but by time he woke up in the morning, it had largely resolved. No issues since.   He has not felt any effects, positive or negative, from his radiation as of yet.   Spouse reports that the patient is receiving all 7 cans of TF regimen most days, though they "may be 1 can short a few days".   He still has nausea when he begins the TF and, to manage this, he takes compazine shortly prior to starting it. He has been having a large amount of gas at night. They have recently tried running the pump @ 140 cc/hr up until he goes to sleep, at  which time they will reduce the rate to 120 cc/hr. They have had success with this.   Meeting entire fluid requirements with flushes has been more difficult. Spouse notes they aim for 1500cc from flushes each day, but often fall a little short. They note their pump is capable of automatic flushes and they ask if they can purchase bags for this purpose.   Patient again tried to take oral intake. This time it was root beer icecream. Pt had the same result as he did the last time he tried to eat; he felt severe stomach cramping within 1 minute and experienced emesis within 2-3 minutes. He has not tried to eat anything since.   Wt today is the same as last week. They  believe the weight of 178 at the surgeons office was wrong. Renal labs stable.   Wt Readings from Last 10 Encounters:  09/06/17 174 lb 3.2 oz (79 kg)  09/01/17 178 lb (80.7 kg)  08/30/17 174 lb 4.8 oz (79.1 kg)  08/19/17 179 lb (81.2 kg)  08/19/17 179 lb (81.2 kg)  08/10/17 186 lb 4.6 oz (84.5 kg)  08/01/17 195 lb 6.4 oz (88.6 kg)  07/28/17 199 lb (90.3 kg)  07/26/17 199 lb (90.3 kg)  07/13/17 200 lb (90.7 kg)   MEDICATIONS:  Chemo: taxol, carboplatin w/ decadron Supportive meds: Dulcolax, ambien, carafate, compazine, zofran, oxycodone, h2ra, colace, flexeril.   LABS:  Largely WDL, nothing notable  Recent Labs  Lab 09/06/17 0932  NA 137  K 4.3  CL 100  CO2 27  BUN 19  CREATININE 0.72  CALCIUM 8.9  GLUCOSE 102*   ANTHROPOMETRICS: Height:  Ht Readings from Last 1 Encounters:  08/19/17 5' 10" (1.778 m)   Weight:  Wt Readings from Last 1 Encounters:  09/06/17 174 lb 3.2 oz (79 kg)   BMI:  BMI Readings from Last 1 Encounters:  09/06/17 25.00 kg/m   Wt Change: None. Same wt as last week   ESTIMATED ENERGY NEEDS:  Kcal: 2400-2600 (30-33 kcal/kg bw) Protein:105-120g Pro (1.3-1.5 g/kg bw) Fluid: >2.4 L/d (1 ml/kcal)   NUTRITION DIAGNOSIS:  Increased nutrient needs related to cancer and cancer related  treatments as evidenced by estimated nutritional requirements for this condition and tx  Ongoing  DOCUMENTATION CODES:  Not applicable  INTERVENTION:  Patients symptoms of gas, distension and nausea are likely related to the high TF rate. As noted last week, his pump does not seem to be accurately calibrated and is felt to be infusing quicker than the chosen rate. RD recommended continuing to decrease the TF at night.    With some minor adjustments, pt is doing well on TF regimen. He largely is tolerating his feedings, maintaining weight and appears to be staying hydrated. No TF changes to be made today.   Current TF regimen: 7 cans Osmolite 1.5 via pump @  140/hr w/ flushes equaling 1200-1593m/d (300cc boluses q3-4 hr). Provides: 2485 kcal, 104.3g Pro, 12633mfree water (+1.2-1.5L from flushes).   [facility] water flush bags for [facility] pump.   [facility] and found that the bags cannot be provided to patient unless "100% medically necessary". They would need an MD order as well as documentation showing he is failing to stay hydrated by other means ie hospitalization for dehydration. [facility] rep noted bags 3rd party bags for kangaroo/joey pumps can be purchased online. Will relay this information to pt/spouse.   GOAL:  Patient will meet greater than or equal to 90% of their needs   MET  MONITOR:  PO intake, Weight trends, Supplement acceptance, Diet advancement  Next Visit: 1 week  NaBurtis JunesD, LDN, CNSC Clinical Nutrition Available Tues-Sat via Pager: [phone]/16/2019 11:58 AM

## 2017-09-06 NOTE — Progress Notes (Signed)
Patient tolerated chemotherapy with no complaints voiced.  Port site clean and dry with no bruising or swelling noted at site.  Good blood return noted before and after administration of chemo.  Band aid applied.  VSS with discharge and left ambulatory with family.  No s/s of distress noted.

## 2017-09-07 ENCOUNTER — Emergency Department (HOSPITAL_COMMUNITY)
Admission: EM | Admit: 2017-09-07 | Discharge: 2017-09-07 | Disposition: A | Discharge status: Home / Self Care | Payer: BLUE CROSS/BLUE SHIELD | Attending: Emergency Medicine | Admitting: Emergency Medicine

## 2017-09-07 ENCOUNTER — Other Ambulatory Visit: Payer: Self-pay

## 2017-09-07 ENCOUNTER — Encounter (HOSPITAL_COMMUNITY): Payer: Self-pay

## 2017-09-07 ENCOUNTER — Emergency Department (HOSPITAL_COMMUNITY): Payer: BLUE CROSS/BLUE SHIELD

## 2017-09-07 DIAGNOSIS — K9423 Gastrostomy malfunction: Secondary | ICD-10-CM

## 2017-09-07 DIAGNOSIS — F17210 Nicotine dependence, cigarettes, uncomplicated: Secondary | ICD-10-CM | POA: Insufficient documentation

## 2017-09-07 DIAGNOSIS — C159 Malignant neoplasm of esophagus, unspecified: Secondary | ICD-10-CM | POA: Insufficient documentation

## 2017-09-07 DIAGNOSIS — Z79899 Other long term (current) drug therapy: Secondary | ICD-10-CM | POA: Insufficient documentation

## 2017-09-07 DIAGNOSIS — I10 Essential (primary) hypertension: Secondary | ICD-10-CM | POA: Diagnosis not present

## 2017-09-07 DIAGNOSIS — Z978 Presence of other specified devices: Secondary | ICD-10-CM | POA: Insufficient documentation

## 2017-09-07 DIAGNOSIS — Z934 Other artificial openings of gastrointestinal tract status: Secondary | ICD-10-CM

## 2017-09-07 MED ORDER — IOPAMIDOL (ISOVUE-300) INJECTION 61%
INTRAVENOUS | Status: AC
  Administered 2017-09-07: 30 mL
  Filled 2017-09-07: qty 50

## 2017-09-07 NOTE — ED Triage Notes (Signed)
Pt reports his J tube pulled out a little past the balloon and pt pushed it back in.  Pt denies any pain but wants placement checked.

## 2017-09-07 NOTE — Discharge Instructions (Signed)
Take your usual prescriptions as previously directed. Your J-tube was in good position on the xray today. Call your regular General Surgeon today to schedule a follow up appointment within the next 2 days.  Return to the Emergency Department immediately sooner if worsening.

## 2017-09-07 NOTE — ED Provider Notes (Signed)
[facility] EMERGENCY DEPARTMENT Provider Note   CSN: [id number] Arrival date & time: 09/07/17  4627     History   Chief Complaint No chief complaint on file.   HPI Phillip Lane is a 62 y.o. male.  HPI  Pt was seen at [facility]. Per pt and his wife, c/o sudden onset and resolution of one episode of "J-tube pulled partially out" that occurred overnight last night. Pt states his GI MD had to "deflate the balloon a little bit" because it was "causing a bowel blockage." Pt states he has been wearing a binder to help hold the J-tube in place. Pt states while doing his tube feeds the J-tube "came out a little." Pt states he stopped the tube feeds and pushed the J-tube back in place. Pt's wife states it aspirated easily and "they way it has been." Pt states he is here today "for an xray to make sure it's in place." Pt and wife "do not want the balloon inflated any higher." Denies abd pain. Denies N/V/D, no CP/SOB, no fevers, no bleeding.   Past Medical History:  Diagnosis Date  . Hyperlipidemia   . Hypertension   . Sleep apnea, obstructive     Patient Active Problem List   Diagnosis Date Noted  . Protein-calorie malnutrition, severe 08/10/2017  . Gastric cancer ([place]) 08/03/2017  . 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 Food impaction of esophagus 06/21/2017  . Gastritis and gastroduodenitis   . Esophageal dysphagia     Past Surgical History:  Procedure Laterality Date  . colonoscopy     [place] 37 [place], age 37: ? POLYPS, AGE 44: ? POLYPS  . ESOPHAGEAL DILATION N/A 06/21/2017   Procedure: ESOPHAGEAL DILATION;  Surgeon: Danie Binder, MD;  Location: [facility];  Service: Endoscopy;  Laterality: N/A;  . ESOPHAGOGASTRODUODENOSCOPY N/A 06/21/2017   Procedure: ESOPHAGOGASTRODUODENOSCOPY (EGD);  Surgeon: Danie Binder, MD;  Location: [facility] ENDO SUITE;  Service: Endoscopy;  Laterality: N/A;  . EUS N/A 07/28/2017   Procedure: UPPER ENDOSCOPIC ULTRASOUND (EUS) RADIAL;   Surgeon: [name], MD;  Location: [facility] ENDOSCOPY;  Service: Endoscopy;  Laterality: N/A;  . EYE SURGERY Bilateral    cataract  . JEJUNOSTOMY N/A 08/03/2017   Procedure: OPEN JEJUNOSTOMY TUBE PLACEMENT;  Surgeon: Virl Cagey, MD;  Location: [facility] ORS;  Service: General;  Laterality: N/A;  . PORTACATH PLACEMENT N/A 08/03/2017   Procedure: INSERTION PORT-A-CATH;  Surgeon: Virl Cagey, MD;  Location: [facility] ORS;  Service: General;  Laterality: N/A;        Home Medications    Prior to Admission medications   Medication Sig Start Date End Date Taking? Authorizing Provider  acetaminophen (TYLENOL) 160 MG/5ML liquid Take by mouth every 4 (four) hours as needed for fever.    [provider]  atorvastatin (LIPITOR) 40 MG tablet Take 40 mg by mouth daily.  08/24/17   [provider]  bisacodyl (DULCOLAX) 10 MG suppository Place 1 suppository (10 mg total) rectally daily as needed for moderate constipation. 08/11/17   Virl Cagey, MD  cyclobenzaprine (FLEXERIL) 5 MG tablet Take 1 tablet (5 mg total) by mouth 3 (three) times daily as needed for muscle spasms. 08/16/17   Derek Jack, MD  docusate (COLACE) 50 MG/5ML liquid Place 10 mLs (100 mg total) into feeding tube 2 (two) times daily. 08/11/17   Virl Cagey, MD  famotidine (PEPCID) 40 MG/5ML suspension Place 2.5 mLs (20 mg total) into feeding tube 2 (two)  times daily. 08/11/17   Virl Cagey, MD  lidocaine-prilocaine (EMLA) cream Apply to affected area once 08/02/17   Derek Jack, MD  Loperamide HCl (IMODIUM A-D) 1 MG/7.5ML LIQD Take by mouth.    [provider]  metoCLOPramide (REGLAN) 5 MG/5ML solution Place 5 mLs (5 mg total) into feeding tube 4 (four) times daily -  before meals and at bedtime. 08/11/17   Virl Cagey, MD  Nutritional Supplements (FEEDING SUPPLEMENT, OSMOLITE 1.5 CAL,) LIQD Give osmolite 1.5 at 121ml/hr for 12 hours via continuous pump through J-tube per day.   Start at rate of 132ml/hr for 12 hours and increase by 58ml daily until goal rate of 145ml reached. 08/19/17   Derek Jack, MD  ondansetron (ZOFRAN-ODT) 4 MG disintegrating tablet Take 1 tablet (4 mg total) by mouth every 6 (six) hours as needed for nausea or vomiting. 08/11/17   Virl Cagey, MD  oxyCODONE (ROXICODONE INTENSOL) 20 MG/ML concentrated solution Place 0.3 mLs (6 mg total) into feeding tube every 4 (four) hours as needed for severe pain or breakthrough pain. 08/11/17   Virl Cagey, MD  prochlorperazine (COMPAZINE) 10 MG tablet Take 1 tablet (10 mg total) by mouth every 6 (six) hours as needed for nausea or vomiting. 08/26/17   Derek Jack, MD  sucralfate (CARAFATE) 1 GM/10ML suspension Take 10 mLs (1 g total) by mouth 4 (four) times daily -  with meals and at bedtime. 08/11/17   Virl Cagey, MD  verapamil (CALAN) 80 MG tablet Place 1 tablet (80 mg total) into feeding tube every 8 (eight) hours. Crush med to fine powder and mix with 15-30 cc of water and give through the tube. 08/11/17   Virl Cagey, MD  Zolpidem Tartrate 5 MG/ACT SOLN Take 1 Act (5 mg total) by mouth at bedtime. 08/19/17   Derek Jack, MD    Family History Family History  Problem Relation Age of Onset  . Hypertension Mother   . Hypertension Father   . Diabetes Father   . Colon cancer Neg Hx   . Colon polyps Neg Hx     Social History Social History   Tobacco Use  . Smoking status: Current Some Day Smoker    Packs/day: 1.00    Types: Cigarettes  . Smokeless tobacco: Never Used  Substance Use Topics  . Alcohol use: Not Currently  . Drug use: Never     Allergies   Adhesive [tape]   Review of Systems Review of Systems ROS: Statement: All systems negative except as marked or noted in the HPI; Constitutional: Negative for fever and chills. ; ; Eyes: Negative for eye pain, redness and discharge. ; ; ENMT: Negative for ear pain, hoarseness, nasal congestion,  sinus pressure and sore throat. ; ; Cardiovascular: Negative for chest pain, palpitations, diaphoresis, dyspnea and peripheral edema. ; ; Respiratory: Negative for cough, wheezing and stridor. ; ; Gastrointestinal: Negative for nausea, vomiting, diarrhea, abdominal pain, blood in stool, hematemesis, jaundice and rectal bleeding. ; ; Genitourinary: Negative for dysuria, flank pain and hematuria. ; ; Musculoskeletal: Negative for back pain and neck pain. Negative for swelling and trauma.; ; Skin: Negative for pruritus, rash, abrasions, blisters, bruising and skin lesion.; ; Neuro: Negative for headache, lightheadedness and neck stiffness. Negative for weakness, altered level of consciousness, altered mental status, extremity weakness, paresthesias, involuntary movement, seizure and syncope.      Physical Exam Updated Vital Signs BP 117/71 (BP Location: Left Arm)   Pulse 89  Temp 97.9 F (36.6 C) (Oral)   Resp 18   Ht 5\' 10"  (1.778 m)   Wt 78.9 kg (174 lb)   SpO2 95%   BMI 24.97 kg/m   Physical Exam 0730: Physical examination:  Nursing notes reviewed; Vital signs and O2 SAT reviewed;  Constitutional: Well developed, Well nourished, Well hydrated, In no acute distress; Head:  Normocephalic, atraumatic; Eyes: EOMI, PERRL, No scleral icterus; ENMT: Mouth and pharynx normal, Mucous membranes moist; Neck: Supple, Full range of motion, No lymphadenopathy; Cardiovascular: Regular rate and rhythm, No gallop; Respiratory: Breath sounds clear & equal bilaterally, No wheezes.  Speaking full sentences with ease, Normal respiratory effort/excursion; Chest: Nontender, Movement normal; Abdomen: Soft, Nontender, Nondistended, Normal bowel sounds. Abd binder on.; Genitourinary: No CVA tenderness; Extremities: Peripheral pulses normal, No tenderness, No edema, No calf edema or asymmetry.; Neuro: AA&Ox3, Major CN grossly intact.  Speech clear. No gross focal motor or sensory deficits in extremities.; Skin: Color  normal, Warm, Dry.   ED Treatments / Results  Labs (all labs ordered are listed, but only abnormal results are displayed)   EKG None  Radiology   Procedures Procedures (including critical care time)  Medications Ordered in ED Medications - No data to display   Initial Impression / Assessment and Plan / ED Course  I have reviewed the triage vital signs and the nursing notes.  Pertinent labs & imaging results that were available during my care of the patient were reviewed by me and considered in my medical decision making (see chart for details).  MDM Reviewed: previous chart, nursing note and vitals Interpretation: x-ray   Dg Abdomen Peg Tube Location Result Date: 09/07/2017 CLINICAL DATA:  Jejunostomy tube malfunction. EXAM: ABDOMEN - 1 VIEW COMPARISON:  None. FINDINGS: Single image shows contrast injected through the jejunostomy tube, within normal pattern. No extravasation or evident obstruction. IMPRESSION: Jejunostomy tube check has a good appearance. Electronically Signed   By: Nelson Chimes M.D.   On: 09/07/2017 09:05    0910:  XR reassuring. Pt and wife do NOT want me to fill balloon further; they will contact General Surgeon. Pt continues to deny abd pain. Abd remains benign on exam, VSS. Pt states he is ready to go home now. Dx and testing d/w pt and family.  Questions answered.  Verb understanding, agreeable to d/c home with outpt f/u.   Final Clinical Impressions(s) / ED Diagnoses   Final diagnoses:  Malfunction of percutaneous endoscopic gastrostomy (PEG) tube [facility])    ED Discharge Orders    None       Francine Graven, DO 09/11/17 1732

## 2017-09-09 ENCOUNTER — Telehealth (HOSPITAL_COMMUNITY): Payer: Self-pay

## 2017-09-09 NOTE — Telephone Encounter (Signed)
Nutrition  Called at the request of RN, Diane.  Wife calling concerned about patient's nausea shortly after starting J-tube feedings.    Called wife and reports patient continues to have feel nauseated shortly after starting J-tube feeding.  Reports Wednesday night patient only ran tube feeding [place] enough to infuse 1 carton and turned pump off due to feeling so nauseated, red in the face, hot feeling (no temperature per wife).  Wife reports last night felt little queasy when starting but then felt better.  Wife reports regular bowel movement daily (sometimes loose but no diarrhea)  Noted went to ED on 7/17 for feeding tube placement check as pulled out little bit.  Xray confirmed placement.  Intervention: Discussed case with RD following patient currently.  Recommend to lower tube feeding rate and extend time patient is on pump to help with nausea.  Examples given to wife would be 179ml/hr for 14 hours, 172ml for 16 hours.  Goal would still be to get total of 7 cartons of tube feeding in to meet nutritional needs.  Wife verbalized understanding.     Next Visit:  Tuesday, July 23 with Ovid Curd, RD. Wife verbalized understanding.  Avagail Whittlesey B. Zenia Resides, [place], [place] Registered Dietitian [phone] (pager)

## 2017-09-13 ENCOUNTER — Inpatient Hospital Stay (HOSPITAL_COMMUNITY): Payer: BLUE CROSS/BLUE SHIELD | Admitting: Dietician

## 2017-09-13 ENCOUNTER — Inpatient Hospital Stay (HOSPITAL_COMMUNITY): Payer: BLUE CROSS/BLUE SHIELD

## 2017-09-13 ENCOUNTER — Encounter (HOSPITAL_COMMUNITY): Payer: Self-pay | Admitting: Hematology

## 2017-09-13 ENCOUNTER — Encounter (HOSPITAL_COMMUNITY): Payer: Self-pay

## 2017-09-13 ENCOUNTER — Inpatient Hospital Stay (HOSPITAL_BASED_OUTPATIENT_CLINIC_OR_DEPARTMENT_OTHER): Payer: BLUE CROSS/BLUE SHIELD | Admitting: Hematology

## 2017-09-13 VITALS — BP 125/77 | HR 79 | Temp 97.6°F | Resp 18 | Wt 171.6 lb

## 2017-09-13 DIAGNOSIS — R5383 Other fatigue: Secondary | ICD-10-CM

## 2017-09-13 DIAGNOSIS — C160 Malignant neoplasm of cardia: Secondary | ICD-10-CM

## 2017-09-13 DIAGNOSIS — R140 Abdominal distension (gaseous): Secondary | ICD-10-CM

## 2017-09-13 DIAGNOSIS — R1319 Other dysphagia: Secondary | ICD-10-CM | POA: Diagnosis not present

## 2017-09-13 DIAGNOSIS — R634 Abnormal weight loss: Secondary | ICD-10-CM | POA: Diagnosis not present

## 2017-09-13 DIAGNOSIS — K5900 Constipation, unspecified: Secondary | ICD-10-CM

## 2017-09-13 DIAGNOSIS — R110 Nausea: Secondary | ICD-10-CM

## 2017-09-13 DIAGNOSIS — I10 Essential (primary) hypertension: Secondary | ICD-10-CM

## 2017-09-13 DIAGNOSIS — G4733 Obstructive sleep apnea (adult) (pediatric): Secondary | ICD-10-CM

## 2017-09-13 DIAGNOSIS — Z79899 Other long term (current) drug therapy: Secondary | ICD-10-CM

## 2017-09-13 DIAGNOSIS — B9681 Helicobacter pylori [H. pylori] as the cause of diseases classified elsewhere: Secondary | ICD-10-CM

## 2017-09-13 DIAGNOSIS — F17210 Nicotine dependence, cigarettes, uncomplicated: Secondary | ICD-10-CM

## 2017-09-13 DIAGNOSIS — Z934 Other artificial openings of gastrointestinal tract status: Secondary | ICD-10-CM | POA: Diagnosis not present

## 2017-09-13 DIAGNOSIS — E785 Hyperlipidemia, unspecified: Secondary | ICD-10-CM

## 2017-09-13 DIAGNOSIS — R232 Flushing: Secondary | ICD-10-CM

## 2017-09-13 DIAGNOSIS — K2950 Unspecified chronic gastritis without bleeding: Secondary | ICD-10-CM

## 2017-09-13 DIAGNOSIS — R12 Heartburn: Secondary | ICD-10-CM

## 2017-09-13 LAB — COMPREHENSIVE METABOLIC PANEL
ALBUMIN: 3.8 g/dL (ref 3.5–5.0)
ALK PHOS: 95 U/L (ref 38–126)
ALT: 30 U/L (ref 0–44)
AST: 21 U/L (ref 15–41)
Anion gap: 9 (ref 5–15)
BILIRUBIN TOTAL: 0.4 mg/dL (ref 0.3–1.2)
BUN: 17 mg/dL (ref 6–20)
CO2: 30 mmol/L (ref 22–32)
CREATININE: 0.67 mg/dL (ref 0.61–1.24)
Calcium: 9.1 mg/dL (ref 8.9–10.3)
Chloride: 99 mmol/L (ref 98–111)
GFR calc Af Amer: >60 mL/min (ref >60)
GLUCOSE: 104 mg/dL — AB (ref 70–99)
POTASSIUM: 4.6 mmol/L (ref 3.5–5.1)
Sodium: 138 mmol/L (ref 135–145)
TOTAL PROTEIN: 7 g/dL (ref 6.5–8.1)

## 2017-09-13 LAB — CBC WITH DIFFERENTIAL/PLATELET
BASOS ABS: 0 10*3/uL (ref 0.0–0.1)
BASOS PCT: 1 %
Eosinophils Absolute: 0.1 10*3/uL (ref 0.0–0.7)
Eosinophils Relative: 1 %
HEMATOCRIT: 42.5 % (ref 39.0–52.0)
HEMOGLOBIN: 14.2 g/dL (ref 13.0–17.0)
LYMPHS PCT: 11 %
Lymphs Abs: 0.5 10*3/uL — ABNORMAL LOW (ref 0.7–4.0)
MCH: 30.5 pg (ref 26.0–34.0)
MCHC: 33.4 g/dL (ref 30.0–36.0)
MCV: 91.2 fL (ref 78.0–100.0)
Monocytes Absolute: 0.6 10*3/uL (ref 0.1–1.0)
Monocytes Relative: 14 %
NEUTROS ABS: 3.1 10*3/uL (ref 1.7–7.7)
Neutrophils Relative %: 73 %
Platelets: 171 10*3/uL (ref 150–400)
RBC: 4.66 MIL/uL (ref 4.22–5.81)
RDW: 13.4 % (ref 11.5–15.5)
WBC: 4.2 10*3/uL (ref 4.0–10.5)

## 2017-09-13 LAB — MAGNESIUM: MAGNESIUM: 2.3 mg/dL (ref 1.7–2.4)

## 2017-09-13 MED ORDER — PALONOSETRON HCL INJECTION 0.25 MG/5ML
INTRAVENOUS | Status: AC
  Filled 2017-09-13: qty 5

## 2017-09-13 MED ORDER — FAMOTIDINE IN NACL 20-0.9 MG/50ML-% IV SOLN
20.0000 mg | Freq: Once | INTRAVENOUS | Status: AC
  Administered 2017-09-13: 20 mg via INTRAVENOUS

## 2017-09-13 MED ORDER — DIPHENHYDRAMINE HCL 50 MG/ML IJ SOLN
50.0000 mg | Freq: Once | INTRAMUSCULAR | Status: AC
  Administered 2017-09-13: 50 mg via INTRAVENOUS

## 2017-09-13 MED ORDER — HEPARIN SOD (PORK) LOCK FLUSH 100 UNIT/ML IV SOLN
500.0000 [IU] | Freq: Once | INTRAVENOUS | Status: AC | PRN
  Administered 2017-09-13: 500 [IU]

## 2017-09-13 MED ORDER — SODIUM CHLORIDE 0.9 % IV SOLN
20.0000 mg | Freq: Once | INTRAVENOUS | Status: AC
  Administered 2017-09-13: 20 mg via INTRAVENOUS
  Filled 2017-09-13: qty 2

## 2017-09-13 MED ORDER — FAMOTIDINE IN NACL 20-0.9 MG/50ML-% IV SOLN
INTRAVENOUS | Status: AC
  Filled 2017-09-13: qty 50

## 2017-09-13 MED ORDER — SODIUM CHLORIDE 0.9 % IV SOLN
299.2000 mg | Freq: Once | INTRAVENOUS | Status: AC
  Administered 2017-09-13: 300 mg via INTRAVENOUS
  Filled 2017-09-13: qty 30

## 2017-09-13 MED ORDER — SODIUM CHLORIDE 0.9 % IV SOLN
Freq: Once | INTRAVENOUS | Status: AC
Start: 2017-09-13 — End: 2017-09-13
  Administered 2017-09-13: 12:00:00 via INTRAVENOUS

## 2017-09-13 MED ORDER — DIPHENHYDRAMINE HCL 50 MG/ML IJ SOLN
INTRAMUSCULAR | Status: AC
Start: 2017-09-13 — End: ?
  Filled 2017-09-13: qty 1

## 2017-09-13 MED ORDER — SODIUM CHLORIDE 0.9% FLUSH
10.0000 mL | INTRAVENOUS | Status: DC | PRN
  Administered 2017-09-13: 10 mL
  Filled 2017-09-13: qty 10

## 2017-09-13 MED ORDER — PALONOSETRON HCL INJECTION 0.25 MG/5ML
0.2500 mg | Freq: Once | INTRAVENOUS | Status: AC
  Administered 2017-09-13: 0.25 mg via INTRAVENOUS

## 2017-09-13 MED ORDER — PACLITAXEL CHEMO INJECTION 300 MG/50ML
50.0000 mg/m2 | Freq: Once | INTRAVENOUS | Status: AC
  Administered 2017-09-13: 102 mg via INTRAVENOUS
  Filled 2017-09-13: qty 17

## 2017-09-13 NOTE — Progress Notes (Signed)
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[place])      08/02/2017 -  Chemotherapy    The patient had palonosetron (ALOXI) injection 0.25 mg, 0.25 mg, Intravenous,  Once, 1 of 1 cycle Administration: 0.25 mg (08/30/2017), 0.25 mg (09/06/2017), 0.25 mg (09/13/2017) CARBOplatin (PARAPLATIN) 300 mg in sodium chloride 0.9 % 250 mL chemo infusion, 300 mg (100 % of original dose 299.2 mg), Intravenous,  Once, 1 of 1 cycle Dose modification:   (original dose 299.2 mg, Cycle 1),   (original dose 299.2 mg, Cycle 1),   (original dose 299.2 mg, Cycle 1) Administration: 300 mg (08/30/2017), 300 mg (09/06/2017), 300 mg (09/13/2017) PACLitaxel (TAXOL) 102 mg in sodium chloride 0.9 % 250 mL chemo infusion (</= 80mg /m2), 50 mg/m2 = 102 mg, Intravenous,  Once, 1 of 1 cycle Administration: 102 mg (08/30/2017), 102 mg (09/06/2017), 102 mg (09/13/2017)  for chemotherapy treatment.          INTERVAL HISTORY:  [name] Lane 62 y.o. male returns for routine follow-up for GE junction adenocarcinoma and consideration for his 3rd cycle of chemotherapy. Patient is here today with his wife. Patient took Mylanta for his heart burn yesterday and keep it down and it helped resolve his heartburn. Patient is having flushing, bloating, and nausea with his tube feedings. He is still getting his 7 cans in a day just over a larger amount of time and takes a break during the feedings. Denies any new pains. Denies any diarrhea. Patient maintains his energy at 75%.    REVIEW OF SYSTEMS:  Review of Systems  Constitutional: Positive for  fatigue.  Gastrointestinal: Positive for nausea.  All other systems reviewed and are negative.    PAST MEDICAL/SURGICAL HISTORY:  Past Medical History:  Diagnosis Date  . Hyperlipidemia   . Hypertension   . Sleep apnea, obstructive    Past Surgical History:  Procedure Laterality Date  . colonoscopy     [place] 52 poly[place] removed, age 8: ? POLYPS, AGE 74: ? POLYPS  . ESOPHAGEAL DILATION N/A 06/21/2017   Procedure: ESOPHAGEAL DILATION;  Surgeon: Danie Binder, MD;  Location: [facility] ENDO SUITE;  Service: Endoscopy;  Laterality: N/A;  . ESOPHAGOGASTRODUODENOSCOPY N/A 06/21/2017   Procedure: ESOPHAGOGASTRODUODENOSCOPY (EGD);  Surgeon: Danie Binder, MD;  Location: [facility] ENDO SUITE;  Service: Endoscopy;  Laterality: N/A;  . EUS N/A 07/28/2017   Procedure: UPPER ENDOSCOPIC ULTRASOUND (EUS) RADIAL;  Surgeon: [name], MD;  Location[facility] ENDOSCOPY;  Service: Endoscopy;  Laterality: N/A;  . EYE SURGERY Bilateral    cataract  . JEJUNOSTOMY N/A 08/03/2017   Procedure: OPEN JEJUNOSTOMY TUBE PLACEMENT;  Surgeon: Virl Cagey, MD;  Location: [facility] ORS;  Service: General;  Laterality: N/A;  . PORTACATH PLACEMENT N/A 08/03/2017   Procedure: INSERTION PORT-A-CATH;  Surgeon: Virl Cagey, MD;  Location: [facility] ORS;  Service: General;  Laterality: N/A;     SOCIAL HISTORY:  Social History   Socioeconomic History  . Marital status: Married    Spouse name: Not on file  . Number of children: Not on file  . Years of education: Not on  file  . Highest education level: Not on file  Occupational History  . Not on file  Social Needs  . Financial resource strain: Not on file  . Food insecurity:    Worry: Not on file    Inability: Not on file  . Transportation needs:    Medical: Not on file    Non-medical: Not on file  Tobacco Use  . Smoking status: Current Some Day Smoker    Packs/day: 1.00    Types: Cigarettes  . Smokeless tobacco: Never Used  Substance and Sexual Activity  .  Alcohol use: Not Currently  . Drug use: Never  . Sexual activity: Not on file  Lifestyle  . Physical activity:    Days per week: Not on file    Minutes per session: Not on file  . Stress: Not on file  Relationships  . Social connections:    Talks on phone: Not on file    Gets together: Not on file    Attends religious service: Not on file    Active member of club or organization: Not on file    Attends meetings of clubs or organizations: Not on file    Relationship status: Not on file  . Intimate partner violence:    Fear of current or ex partner: Not on file    Emotionally abused: Not on file    Physically abused: Not on file    Forced sexual activity: Not on file  Other Topics Concern  . Not on file  Social History Narrative  . Not on file    FAMILY HISTORY:  Family History  Problem Relation Age of Onset  . Hypertension Mother   . Hypertension Father   . Diabetes Father   . Colon cancer Neg Hx   . Colon polyps Neg Hx     CURRENT MEDICATIONS:  Outpatient Encounter Medications as of 09/13/2017  Medication Sig  . acetaminophen (TYLENOL) 160 MG/5ML liquid Take by mouth every 4 (four) hours as needed for fever.  [place] Kitchen atorvastatin (LIPITOR) 40 MG tablet Take 40 mg by mouth daily.   . bisacodyl (DULCOLAX) 10 MG suppository Place 1 suppository (10 mg total) rectally daily as needed for moderate constipation.  . cyclobenzaprine (FLEXERIL) 5 MG tablet Take 1 tablet (5 mg total) by mouth 3 (three) times daily as needed for muscle spasms.  Mar[place] Kitchen docusate (COLACE) 50 MG/5ML liquid Place 10 mLs (100 mg total) into feeding tube 2 (two) times daily.  . famotidine (PEPCID) 40 MG/5ML suspension Place 2.5 mLs (20 mg total) into feeding tube 2 (two) times daily.  Marland Kitchen lidocaine-prilocaine (EMLA) cream Apply to affected area once  . Loperamide HCl (IMODIUM A-D) 1 MG/7.5ML LIQD Take by mouth.  . metoCLOPramide (REGLAN) 5 MG/5ML solution Place 5 mLs (5 mg total) into feeding tube 4 (four) times daily  -  before meals and at bedtime.  . Nutritional Supplements (FEEDING SUPPLEMENT, OSMOLITE 1.5 CAL,) LIQD Give osmolite 1.5 at 178ml/hr for 12 hours via continuous pump through J-tube per day.  Start at rate of 180ml/hr for 12 hours and increase by 47ml daily until goal rate of 168ml reached.  . ondansetron (ZOFRAN-ODT) 4 MG disintegrating tablet Take 1 tablet (4 mg total) by mouth every 6 (six) hours as needed for nausea or vomiting.  Marland Kitchen oxyCODONE (ROXICODONE INTENSOL) 20 MG/ML concentrated solution Place 0.3 mLs (6 mg total) into feeding tube every 4 (four) hours as needed for severe pain or breakthrough pain.  Mar[place] Kitchen prochlorperazine (COMPAZINE) 10 MG  tablet Take 1 tablet (10 mg total) by mouth every 6 (six) hours as needed for nausea or vomiting.  . sucralfate (CARAFATE) 1 GM/10ML suspension Take 10 mLs (1 g total) by mouth 4 (four) times daily -  with meals and at bedtime.  . verapamil (CALAN) 80 MG tablet Place 1 tablet (80 mg total) into feeding tube every 8 (eight) hours. Crush med to fine powder and mix with 15-30 cc of water and give through the tube.  Mar[place] Kitchen Zolpidem Tartrate 5 MG/ACT SOLN Take 1 Act (5 mg total) by mouth at bedtime.   No facility-administered encounter medications on file as of 09/13/2017.     ALLERGIES:  Allergies  Allergen Reactions  . Adhesive [Tape] Other (See Comments)    Paper tape only     PHYSICAL EXAM:  ECOG Performance status: 1  VITAL SIGNS :BP 111/65, P: 92, R: 18, TEMP: 98.2, 02: 97%  Physical Exam   LABORATORY DATA:  I have reviewed the labs as listed.  CBC    Component Value Date/Time   WBC 4.2 09/13/2017 1024   RBC 4.66 09/13/2017 1024   HGB 14.2 09/13/2017 1024   HCT 42.5 09/13/2017 1024   PLT 171 09/13/2017 1024   MCV 91.2 09/13/2017 1024   MCH 30.5 09/13/2017 1024   MCHC 33.4 09/13/2017 1024   RDW 13.4 09/13/2017 1024   LYMPHSABS 0.5 (L) 09/13/2017 1024   MONOABS 0.6 09/13/2017 1024   EOSABS 0.1 09/13/2017 1024   BASOSABS 0.0 09/13/2017  1024   CMP Latest Ref Rng & Units 09/13/2017 09/06/2017 08/30/2017  Glucose 70 - 99 mg/dL 104(H) 102(H) 113(H)  BUN 6 - 20 mg/dL 17 19 20   Creatinine 0.61 - 1.24 mg/dL 0.67 0.72 0.77  Sodium 135 - 145 mmol/L 138 137 140  Potassium 3.5 - 5.1 mmol/L 4.6 4.3 4.5  Chloride 98 - 111 mmol/L 99 100 102  CO2 22 - 32 mmol/L 30 27 28   Calcium 8.9 - 10.3 mg/dL 9.1 8.9 9.3  Total Protein 6.5 - 8.1 g/dL 7.0 7.0 7.2  Total Bilirubin 0.3 - 1.2 mg/dL 0.4 0.6 0.4  Alkaline Phos 38 - 126 U/L 95 93 104  AST 15 - 41 U/L 21 25 24   ALT 0 - 44 U/L 30 31 40         ASSESSMENT & PLAN:   Gastroesophageal cancer (HCC) 1.  GE junction poorly differentiated adenocarcinoma (uT3uNx): - EGD on 06/21/2017 shows benign-appearing stenosis which was dilated in the distal esophagus.  Localized prominent gastric folds were found in the cardia, which was biopsied.  There was also localized mild inflammation with congestion and erythema in the antrum, biopsies were taken for H. pylori testing. - Stomach biopsy shows chronic gastritis with H. pylori positive.  Esophageal gastric junction biopsy shows poorly differentiated adenocarcinoma. -I have discussed the findings of the pathology report with the patient and his wife in detail. -PET CT scan dated 07/12/2017 showed hypermetabolic uptake in the distal esophagus, extending into GE junction.  The segment is approximately 4.2 cm [place]. - On 07/28/2017 EGD/EUS done by Dr. Edison Nasuti showed circumferential tight, nearly obstructing stricture at GE junction, 1.3 cm thick, with the lumen of 1 to 2 mm.  UT3 NX. -Jejunostomy tube was placed on 08/03/2017.  He lost about 12 pounds since the diagnosis.  He is receiving tube feeds via pump over more than 12 hours at nighttime, at 110-140 mL/h.  He is tolerating them very well.  He has not lost any  weight in the last couple of weeks. - Weekly carbo platinum and paclitaxel with radiation started on 08/30/2017 - He gets bloated 3 to 4 hours after  starting tube feeds.  He also becomes occasionally nauseous.  He is working with our dietitian Ovid Curd to adjust the flow rate.  Today his blood count is adequate to proceed with week 3 of chemotherapy.      Orders placed this encounter:  Orders Placed This Encounter  Procedures  . Magnesium  . CBC with Differential/Platelet  . Comprehensive metabolic panel      Derek Jack, MD [place] [phone]

## 2017-09-13 NOTE — Assessment & Plan Note (Signed)
1.  GE junction poorly differentiated adenocarcinoma (uT3uNx): - EGD on 06/21/2017 shows benign-appearing stenosis which was dilated in the distal esophagus.  Localized prominent gastric folds were found in the cardia, which was biopsied.  There was also localized mild inflammation with congestion and erythema in the antrum, biopsies were taken for H. pylori testing. - Stomach biopsy shows chronic gastritis with H. pylori positive.  Esophageal gastric junction biopsy shows poorly differentiated adenocarcinoma. -I have discussed the findings of the pathology report with the patient and his wife in detail. -PET CT scan dated 07/12/2017 showed hypermetabolic uptake in the distal esophagus, extending into GE junction.  The segment is approximately 4.2 cm [place]. - On 07/28/2017 EGD/EUS done by Dr. Edison Nasuti showed circumferential tight, nearly obstructing stricture at GE junction, 1.3 cm thick, with the lumen of 1 to 2 mm.  UT3 NX. -Jejunostomy tube was placed on 08/03/2017.  He lost about 12 pounds since the diagnosis.  He is receiving tube feeds via pump over more than 12 hours at nighttime, at 110-140 mL/h.  He is tolerating them very well.  He has not lost any weight in the last couple of weeks. - Weekly carbo platinum and paclitaxel with radiation started on 08/30/2017 - He gets bloated 3 to 4 hours after starting tube feeds.  He also becomes occasionally nauseous.  He is working with our dietitian Ovid Curd to adjust the flow rate.  Today his blood count is adequate to proceed with week 3 of chemotherapy.

## 2017-09-13 NOTE — Patient Instructions (Signed)
[facility] Discharge Instructions for Patients Receiving Chemotherapy  Today you received the following chemotherapy agents carboplatin and taxol.    If you develop nausea and vomiting that is not controlled by your nausea medication, call the clinic.   BELOW ARE SYMPTOMS THAT SHOULD BE REPORTED IMMEDIATELY:  *FEVER GREATER THAN 100.5 F  *CHILLS WITH OR WITHOUT FEVER  NAUSEA AND VOMITING THAT IS NOT CONTROLLED WITH YOUR NAUSEA MEDICATION  *UNUSUAL SHORTNESS OF BREATH  *UNUSUAL BRUISING OR BLEEDING  TENDERNESS IN MOUTH AND THROAT WITH OR WITHOUT PRESENCE OF ULCERS  *URINARY PROBLEMS  *BOWEL PROBLEMS  UNUSUAL RASH Items with * indicate a potential emergency and should be followed up as soon as possible.  Feel free to call the clinic should you have any questions or concerns. The clinic phone number is [phone].  Please show the CHEMO ALERT CARD at check-in to the Emergency Department and triage nurse.   

## 2017-09-13 NOTE — Progress Notes (Signed)
Patient seen by Dr. Delton Coombes for follow up and lab review.  Ok to treat today.   Patient tolerated chemotherapy with no complaints voiced.  Port site clean and dry with no bruising or swelling noted at site.  Good blood return noted before and after administration of chemotherapy.  Flushed with no complaints of pain.  Band aid applied. VSS with discharge and left ambulatory with no s/s of distress.  Family at side.

## 2017-09-13 NOTE — Progress Notes (Signed)
Nutrition Follow-up 62 y/o male PMHx HTN/HLD, OSA. Abruptly developed dysphagia to meats end of April, presenting to ED 4/30. EGD and biopsy performed. Biopsy + for adenocarcinoma w/ workup revealing limited disease. During workup, pt developed rapidly progressiing dysphagia, requiring hospitalization for open J tube insertion. Started TF. Pt began chemoradiation as tentative bridge to surgery 7/9.   Pt seen for follow up of J-tube feeding. He has lost about 2.5 lbs this past week.   Last week, Pt reported nausea when initially starting his tube feeds, though it was somewhat managed with compazine. He had been been starting at rate of 140 cc/hr and then transitioning to 120/hr when he went to sleep. Important to reiterate, His pump does not seem to be accurately calibrated and is felt to be infusing quicker than the chosen rate.RD recommended decreasing TF rate.   Today, Pt reports he is no longer infusing at 140/hr and instead keeping his TF rate at 120cc/hr for the entire duration of  feeding. He still reports nausea/bloating w/ tube feeds, though Decreasing TF rate did help some. Additionally, he reports consistently experiencing a rather bizarre occurrence during feedings, where he feels his face flush, develops nausea and experiencing a sensation of being hot. To RD, it almost sounds as if he is experiencing dumping syndrome, potentially from a high rate of carbohydrates infusing into his small bowel. However, pt reports this happens 3-4 hrs into the feedings and not immediately. Pt says this occurrence has also lessened in severity by decreasing the rate.   Patient/spouse report that this past week, their were 3 occasions where they did not meet the 7 can goal. The amounts he received on those days were 6.5 cans, 5 cans, and one day he only got 2 cans. Apparently he had tolerance issues on these days.   He says he has a "loose BM" when starting the TF, and then will have softer BMs on and off during  feeds. He says these are not diarrhea. These sound to be appropriate.   Yesterday, he ran the feed consistently at 120cc/hr from 3 pm- 3:15 am, infusing all 7 cans (258m ea). This again demonstrates his pump is not accurately calibrated. With these calculations, the patient pump was actually infusing at a rate of 135 cc/hr.   Pt says he has been better about administering his 1.5 L of free water via flushes. Labs reviewed. Kidney function WDL and stable.   In addition to his TF related symptoms, he says he has been having severe heartburn. He had been taking mylanta via PEG to try to manage both his gas and heartburn, but this ultimately had little effect. However, he then tried taking it orally, and he said "I felt it go through (into his stomach)" and he had significant relief of his heartburn.   He has not tried taking any oral intake since we last spoke.   Spouse is wondering about medications that could be given through tube that are ONLY for gas, such as gas-x.   In regards to tolerance of his chemoradiation. Surprisingly, Patient says he felt "the best I have yet" after his last chemo session. (due to steroids?). He has not had any chemo related side effects. He still has not felt any effects, positive or negative, from his radiation as of yet.   Wt Readings from Last 10 Encounters:  09/13/17 171 lb 9.6 oz (77.8 kg)  09/07/17 174 lb (78.9 kg)  09/06/17 174 lb 3.2 oz (79 kg)  09/01/17  178 lb (80.7 kg)  08/30/17 174 lb 4.8 oz (79.1 kg)  08/19/17 179 lb (81.2 kg)  08/19/17 179 lb (81.2 kg)  08/10/17 186 lb 4.6 oz (84.5 kg)  08/01/17 195 lb 6.4 oz (88.6 kg)  07/28/17 199 lb (90.3 kg)   MEDICATIONS:  Chemo: taxol, carboplatin w/ decadron Supportive meds: Dulcolax, ambien, carafate, compazine, zofran, oxycodone, h2ra, colace, flexeril.   LABS:  Largely WDL, nothing notable  Recent Labs  Lab 09/13/17 1024  NA 138  K 4.6  CL 99  CO2 30  BUN 17  CREATININE 0.67  CALCIUM 9.1   MG 2.3  GLUCOSE 104*     ANTHROPOMETRICS: Height:  Ht Readings from Last 1 Encounters:  09/07/17 '5\' 10"'  (1.778 m)   Weight:  Wt Readings from Last 1 Encounters:  09/13/17 171 lb 9.6 oz (77.8 kg)   BMI:  BMI Readings from Last 1 Encounters:  09/13/17 24.62 kg/m   Wt Change: -2.5 lbs x 1wk  ESTIMATED ENERGY NEEDS:  Kcal:    2350-2550 (30-33 kcal/kg bw) Protein:101-117g Pro (1.3-1.5 g/kg bw) Fluid:   >2.4 L/d (1 ml/kcal)  NUTRITION DIAGNOSIS:  Increased nutrient needs related to cancer and cancer related treatments as evidenced by estimated nutritional requirements for this condition and tx  Ongoing  DOCUMENTATION CODES:  Not applicable  INTERVENTION:  Combined, Patient missed out on about a days worth of feeding this past week. He says this was d/t distension and nausea. These sympyoms are likely related to the high TF rate (actually about 20cc/hr faster than what pump indicates). This is supported by the fact he has had relief of nearly all of his side effects when he decreased the TF rate. This coming week, they will run it at 110 cc (per their pumps calibration).    RN noted she would ask MD about the spouses question regarding a liquid form of gas-x or similar medication.   Ideally, patient would be able to start taking some PO intake. THis would not only help maintain his swallow function, but also help offload some of the calories needed to be taken via tube. Given he had success taking mylanta Orally. RD encouraged him to retry oral intake this next week. Pt/spouse in agreement.   Updated TF regimen: 7 cans Osmolite 1.5 via pump @ 110hr w/ flushes equaling 1200-1513m/d (300cc boluses q3-4 hr). Provides: 2485 kcal, 104.3g Pro, 12633mfree water (+1.2-1.5L from flushes).   Regarding spouses request for water flush bags for their feeding pump, RD reviewed how [facility] could not provide these unless  "100% medically necessary" and they had related MD orders/documentation. RD  noted that 3rd party bags for kangaroo/joey pumps can be purchased online. They stated they would pass on this for now.   GOAL:  Patient will meet greater than or equal to 90% of their needs   MET  MONITOR:  PO intake, Weight trends, TF tolerance, Diet advancement  Next Visit: 1 week  NaBurtis JunesD, LDN, CNSC Clinical Nutrition Available Tues-Sat via Pager: [phone]/23/2019 12:22 PM

## 2017-09-20 ENCOUNTER — Inpatient Hospital Stay (HOSPITAL_COMMUNITY): Payer: BLUE CROSS/BLUE SHIELD

## 2017-09-20 ENCOUNTER — Other Ambulatory Visit: Payer: Self-pay

## 2017-09-20 ENCOUNTER — Inpatient Hospital Stay (HOSPITAL_COMMUNITY): Payer: BLUE CROSS/BLUE SHIELD | Admitting: Hematology

## 2017-09-20 ENCOUNTER — Encounter (HOSPITAL_COMMUNITY): Payer: Self-pay | Admitting: Dietician

## 2017-09-20 ENCOUNTER — Encounter (HOSPITAL_COMMUNITY): Payer: Self-pay | Admitting: Hematology

## 2017-09-20 ENCOUNTER — Encounter (HOSPITAL_COMMUNITY): Payer: BLUE CROSS/BLUE SHIELD | Admitting: Dietician

## 2017-09-20 VITALS — BP 121/75 | HR 79 | Temp 98.5°F | Resp 18

## 2017-09-20 DIAGNOSIS — R140 Abdominal distension (gaseous): Secondary | ICD-10-CM

## 2017-09-20 DIAGNOSIS — Z934 Other artificial openings of gastrointestinal tract status: Secondary | ICD-10-CM | POA: Diagnosis not present

## 2017-09-20 DIAGNOSIS — R5383 Other fatigue: Secondary | ICD-10-CM

## 2017-09-20 DIAGNOSIS — C160 Malignant neoplasm of cardia: Secondary | ICD-10-CM

## 2017-09-20 DIAGNOSIS — K5900 Constipation, unspecified: Secondary | ICD-10-CM

## 2017-09-20 DIAGNOSIS — I10 Essential (primary) hypertension: Secondary | ICD-10-CM

## 2017-09-20 DIAGNOSIS — R1319 Other dysphagia: Secondary | ICD-10-CM

## 2017-09-20 DIAGNOSIS — E785 Hyperlipidemia, unspecified: Secondary | ICD-10-CM

## 2017-09-20 DIAGNOSIS — K2950 Unspecified chronic gastritis without bleeding: Secondary | ICD-10-CM

## 2017-09-20 DIAGNOSIS — R232 Flushing: Secondary | ICD-10-CM

## 2017-09-20 DIAGNOSIS — F17210 Nicotine dependence, cigarettes, uncomplicated: Secondary | ICD-10-CM

## 2017-09-20 DIAGNOSIS — R110 Nausea: Secondary | ICD-10-CM

## 2017-09-20 DIAGNOSIS — Z79899 Other long term (current) drug therapy: Secondary | ICD-10-CM

## 2017-09-20 DIAGNOSIS — R634 Abnormal weight loss: Secondary | ICD-10-CM

## 2017-09-20 DIAGNOSIS — B9681 Helicobacter pylori [H. pylori] as the cause of diseases classified elsewhere: Secondary | ICD-10-CM

## 2017-09-20 DIAGNOSIS — R112 Nausea with vomiting, unspecified: Secondary | ICD-10-CM

## 2017-09-20 DIAGNOSIS — G4733 Obstructive sleep apnea (adult) (pediatric): Secondary | ICD-10-CM

## 2017-09-20 LAB — COMPREHENSIVE METABOLIC PANEL
ALBUMIN: 3.5 g/dL (ref 3.5–5.0)
ALT: 28 U/L (ref 0–44)
ANION GAP: 10 (ref 5–15)
AST: 21 U/L (ref 15–41)
Alkaline Phosphatase: 88 U/L (ref 38–126)
BUN: 16 mg/dL (ref 6–20)
CALCIUM: 9 mg/dL (ref 8.9–10.3)
CHLORIDE: 95 mmol/L — AB (ref 98–111)
CO2: 30 mmol/L (ref 22–32)
Creatinine, Ser: 0.69 mg/dL (ref 0.61–1.24)
GFR calc non Af Amer: >60 mL/min (ref >60)
Glucose, Bld: 111 mg/dL — ABNORMAL HIGH (ref 70–99)
POTASSIUM: 4.5 mmol/L (ref 3.5–5.1)
Sodium: 135 mmol/L (ref 135–145)
Total Bilirubin: 0.3 mg/dL (ref 0.3–1.2)
Total Protein: 6.7 g/dL (ref 6.5–8.1)

## 2017-09-20 LAB — CBC WITH DIFFERENTIAL/PLATELET
BASOS PCT: 1 %
Basophils Absolute: 0 10*3/uL (ref 0.0–0.1)
EOS ABS: 0 10*3/uL (ref 0.0–0.7)
EOS PCT: 0 %
HCT: 40.8 % (ref 39.0–52.0)
Hemoglobin: 13.6 g/dL (ref 13.0–17.0)
LYMPHS ABS: 0.4 10*3/uL — AB (ref 0.7–4.0)
Lymphocytes Relative: 16 %
MCH: 30.2 pg (ref 26.0–34.0)
MCHC: 33.3 g/dL (ref 30.0–36.0)
MCV: 90.7 fL (ref 78.0–100.0)
MONOS PCT: 10 %
Monocytes Absolute: 0.3 10*3/uL (ref 0.1–1.0)
Neutro Abs: 1.9 10*3/uL (ref 1.7–7.7)
Neutrophils Relative %: 73 %
PLATELETS: 115 10*3/uL — AB (ref 150–400)
RBC: 4.5 MIL/uL (ref 4.22–5.81)
RDW: 13.7 % (ref 11.5–15.5)
WBC: 2.6 10*3/uL — ABNORMAL LOW (ref 4.0–10.5)

## 2017-09-20 LAB — MAGNESIUM: Magnesium: 2.2 mg/dL (ref 1.7–2.4)

## 2017-09-20 MED ORDER — FAMOTIDINE IN NACL 20-0.9 MG/50ML-% IV SOLN
20.0000 mg | Freq: Once | INTRAVENOUS | Status: AC
  Administered 2017-09-20: 20 mg via INTRAVENOUS
  Filled 2017-09-20: qty 50

## 2017-09-20 MED ORDER — PALONOSETRON HCL INJECTION 0.25 MG/5ML
0.2500 mg | Freq: Once | INTRAVENOUS | Status: AC
  Administered 2017-09-20: 0.25 mg via INTRAVENOUS
  Filled 2017-09-20: qty 5

## 2017-09-20 MED ORDER — SODIUM CHLORIDE 0.9 % IV SOLN
50.0000 mg/m2 | Freq: Once | INTRAVENOUS | Status: AC
  Administered 2017-09-20: 102 mg via INTRAVENOUS
  Filled 2017-09-20: qty 17

## 2017-09-20 MED ORDER — SODIUM CHLORIDE 0.9 % IV SOLN
300.0000 mg | Freq: Once | INTRAVENOUS | Status: AC
  Administered 2017-09-20: 300 mg via INTRAVENOUS
  Filled 2017-09-20: qty 30

## 2017-09-20 MED ORDER — SODIUM CHLORIDE 0.9 % IV SOLN
20.0000 mg | Freq: Once | INTRAVENOUS | Status: AC
  Administered 2017-09-20: 20 mg via INTRAVENOUS
  Filled 2017-09-20: qty 2

## 2017-09-20 MED ORDER — SODIUM CHLORIDE 0.9 % IV SOLN
Freq: Once | INTRAVENOUS | Status: AC
  Administered 2017-09-20: 12:00:00 via INTRAVENOUS

## 2017-09-20 MED ORDER — HEPARIN SOD (PORK) LOCK FLUSH 100 UNIT/ML IV SOLN
500.0000 [IU] | Freq: Once | INTRAVENOUS | Status: AC | PRN
  Administered 2017-09-20: 500 [IU]
  Filled 2017-09-20: qty 5

## 2017-09-20 MED ORDER — DIPHENHYDRAMINE HCL 50 MG/ML IJ SOLN
50.0000 mg | Freq: Once | INTRAMUSCULAR | Status: AC
  Administered 2017-09-20: 50 mg via INTRAVENOUS
  Filled 2017-09-20: qty 1

## 2017-09-20 NOTE — Progress Notes (Signed)
Nutrition Follow-up 62 y/o male PMHx HTN/HLD, OSA. Abruptly developed dysphagia to meats end of April, presenting to ED 4/30. EGD / biopsy performed. Biopsy + for adenocarcinoma w/ workup revealing limited disease. During workup, developed rapidly progressing dysphagia, requiring hospitalization for open J tube insertion. Started TF. Began chemoradiation as tentative bridge to surgery 7/9.   Pt seen for follow up of J-tube feeding.  As greatly detailed the previous visits, the patient has been struggling with TF intolerance and he had agreed to try a lower rate. Today, pt/wife report that they are running the pump at 110cc/hr now. It takes them ~12-13 hrs to infuse 7 cans (their pump is not accurately calibrated).   Since decreasing the rate, pt reports that he no longer is having severe distension during feeds. He also notes that, while still present, his nausea  is "a lot better". He still takes compazine to help with nausea. He says it is hard for him staying on the pump so Dueitt each day just due to restrictions in his mobility/functionality and QOL.    Though nausea/distension improved with decreased TF rate, pt still reports experiencing a bizarre occurrence during feedings, where he feels his face flush, develops nausea and experiencing a sensation of being hot (dumping syndrome from high carb infusion ?). Though, this occurrence occurs about 5 hrs into feeding. He is wondering if the reason he does not feel this sensation earlier is because of the compazine.  Last week, RD had encouraged patient to try oral intake so that he could reduce his TF dependence. Unfortunately, when attempting this, patient says he experienced the same side effects this time as he has previously: he develops severe nausea/cramping and ultimately needs to throw up. He experiences this even with water. Spouse notes they spoke to the Rad Onc MD regarding this and he said the patient still was not ready for oral intake.    He has lost another 2 lbs this past week. He is down approximately 10 lbs in 1 month and about 30 lbs in 2 months. These losses are clinically significant. Spouse notes definitively that the pt received all 7 cans of feeding every day this past week, suggesting inadequacy of TF regimen  In regards to tolerance of his chemoradiation. Pt notes abdominal discomfort/tenderness around area he is receiving radiation. He controls this "heartburn" with mylanta. Remarkably, he still has not experienced any adverse side effects whatsoever from chemotherapy    Wt Readings from Last 10 Encounters:  09/20/17 169 lb 9.6 oz (76.9 kg)  09/13/17 171 lb 9.6 oz (77.8 kg)  09/07/17 174 lb (78.9 kg)  09/06/17 174 lb 3.2 oz (79 kg)  09/01/17 178 lb (80.7 kg)  08/30/17 174 lb 4.8 oz (79.1 kg)  08/19/17 179 lb (81.2 kg)  08/19/17 179 lb (81.2 kg)  08/10/17 186 lb 4.6 oz (84.5 kg)  08/01/17 195 lb 6.4 oz (88.6 kg)   MEDICATIONS:  Chemo: taxol, carboplatin  Supportive meds: Dulcolax, carafate, compazine, zofran, oxycodone, h2ra, colace, flexeril.   LABS:  Largely WDL, Albumin 3.8->3.5. Renal fx stable. Sodium WDL  Recent Labs  Lab 09/20/17 0941  NA 135  K 4.5  CL 95*  CO2 30  BUN 16  CREATININE 0.69  CALCIUM 9.0  MG 2.2  GLUCOSE 111*     ANTHROPOMETRICS: Height:  Ht Readings from Last 1 Encounters:  09/07/17 _0  (1.778 m)   Weight:  Wt Readings from Last 1 Encounters:  09/20/17 169 lb 9.6 oz (76.9 kg)  BMI:  BMI Readings from Last 1 Encounters:  09/20/17 24.34 kg/m   Wt Change: -2 lbs x 1wk  RE ESTIMATED ENERGY NEEDS:  Kcal:  2550-2750 (33-36 kcal/kg bw) Protein:108-123g Pro (1.4-1.6 g/kg bw) Fluid:   >2.4L/d (1 ml/kcal)  NUTRITION DIAGNOSIS:  Increased nutrient needs related to cancer and cancer related treatments as evidenced by estimated nutritional requirements for this condition and tx  Ongoing  DOCUMENTATION CODES:  Not applicable-meets criteria for weight, but no  other criteria met at this time, will re perform physical exam next encounter.   INTERVENTION:  Patient appears to have higher energy requirements than previously estimated.  I Increased the patients estimated requirements above to reflect this.   As discussed with spouse/pt today, potential interventions are quite limited. Patient is unfortunately still unable to take ANYTHING orally. Therefore, any interventions would need to go through tube. While modulars are a possibility, he has a J-tube (appears 12-14 french), I worry greatly that prostat or promod will occlude this tube as they tend to do w/ smaller bore feeding tubes. This leaves increasing the number of TF cans. Patient notes "I do not think I could stand being on the pump any longer". After discussion, we agreed to hold off one more week in the hope he will be able to start taking oral intake-he noted he has experienced eructation, something he hasn't had in months, suggesting his esophagus is redeveloping patency.   If he shows more weight loss next week and still cannot take oral intake, RD noted patient will need to increase the amount of cans he delivers.   RD also asked RN about possibility of taking compazine more frequently to do with his bizarre symptoms midway through his tube feeding. Unfortunately, it does not sound that this would be recommended. Patient is currently using his zofran only AFTER he develops nausea. He agreed to taking this on a scheduled basis and will see if it helps quell this sensation he is experiencing.   Current TF regimen: 7 cans Osmolite 1.5 via pump @ 110hr w/ flushes equaling 1200-1541m/d (300cc boluses q3-4 hr). Provides: 2485 kcal, 104.3g Pro, 1268mfree water (+1.2-1.5L from flushes).   GOAL:  Patient will meet greater than or equal to 90% of their needs   No longer thought to be met  MONITOR:  PO intake, Weight trends, TF tolerance, Diet advancement  Next Visit: 1 week  NaBurtis JunesRD, LDN, CNSC Clinical Nutrition Available Tues-Sat via Pager: [phone]/30/2019 12:27 PM

## 2017-09-20 NOTE — Assessment & Plan Note (Signed)
1.  GE junction poorly differentiated adenocarcinoma ([id number]): - EGD on 06/21/2017 shows benign-appearing stenosis which was dilated in the distal esophagus.  Localized prominent gastric folds were found in the cardia, which was biopsied.  There was also localized mild inflammation with congestion and erythema in the antrum, biopsies were taken for H. pylori testing. - Stomach biopsy shows chronic gastritis with H. pylori positive.  Esophageal gastric junction biopsy shows poorly differentiated adenocarcinoma. -I have discussed the findings of the pathology report with the patient and his wife in detail. -PET CT scan dated 07/12/2017 showed hypermetabolic uptake in the distal esophagus, extending into GE junction.  The segment is approximately 4.2 cm Bartolomei. - On 07/28/2017 EGD/EUS done by Dr. Edison Nasuti showed circumferential tight, nearly obstructing stricture at GE junction, 1.3 cm thick, with the lumen of 1 to 2 mm.  UT3 NX. -Jejunostomy tube was placed on 08/03/2017.  He lost about 12 pounds since the diagnosis.  He is receiving tube feeds via pump over more than 12 hours at nighttime, at 110-140 mL/h.  He is tolerating them very well.  He has not lost any weight in the last couple of weeks. - Weekly carbo platinum and paclitaxel with radiation started on 08/30/2017 - Week 3 of treatment was on 09/13/2017.  No major side effects noted.  His white count is slightly low at 2.6 today.  However absolute neutrophil count is okay for treatment.  He will proceed with week 4 today.  We will reevaluate him next week.

## 2017-09-20 NOTE — Progress Notes (Signed)
Tolerated infusions w/o adverse reaction.  Alert, in no distress.  VSS.  Discharged ambulatory in c/o spouse.  

## 2017-09-20 NOTE — Progress Notes (Signed)
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[place])      08/02/2017 -  Chemotherapy    The patient had palonosetron (ALOXI) injection 0.25 mg, 0.25 mg, Intravenous,  Once, 1 of 1 cycle Administration: 0.25 mg (08/30/2017), 0.25 mg (09/06/2017), 0.25 mg (09/13/2017) CARBOplatin (PARAPLATIN) 300 mg in sodium chloride 0.9 % 250 mL chemo infusion, 300 mg (100 % of original dose 299.2 mg), Intravenous,  Once, 1 of 1 cycle Dose modification:   (original dose 299.2 mg, Cycle 1),   (original dose 299.2 mg, Cycle 1),   (original dose 299.2 mg, Cycle 1),   (original dose 299.2 mg, Cycle 1) Administration: 300 mg (08/30/2017), 300 mg (09/06/2017), 300 mg (09/13/2017) PACLitaxel (TAXOL) 102 mg in sodium chloride 0.9 % 250 mL chemo infusion (</= 80mg /m2), 50 mg/m2 = 102 mg, Intravenous,  Once, 1 of 1 cycle Administration: 102 mg (08/30/2017), 102 mg (09/06/2017), 102 mg (09/13/2017)  for chemotherapy treatment.          INTERVAL HISTORY:  Phillip Lane 62 y.o. male returns for routine follow-up for GE junction adenocarcinoma. Patient is here today with his wife. Patient states he is still having the episodes where he spits up his saliva about 4-5 times a day. He is still having some nausea from the tube feeding. He states he is starting to burp so it feels like he is improving. He finishes the radiation on August 13th. He is very fatigued with activity. He stays at home mostly. Denies any diarrhea or constipation. Denies any mouth sores.   REVIEW OF SYSTEMS:  Review of Systems  Constitutional: Positive for fatigue.  HENT:    Positive for trouble swallowing.   Eyes: Negative.   Respiratory: Negative.   Cardiovascular: Negative.   Gastrointestinal: Positive for nausea.  Endocrine: Negative.   Genitourinary: Negative.    Skin: Negative.   Neurological: Positive for extremity weakness.  Hematological: Negative.   Psychiatric/Behavioral: Negative.      PAST MEDICAL/SURGICAL HISTORY:  Past Medical History:  Diagnosis Date  . Hyperlipidemia   . Hypertension   . Sleep apnea, obstructive    Past Surgical History:  Procedure Laterality Date  . colonoscopy     [place] 36 [place] age 87: ? POLYPS, AGE 6: ? POLYPS  . ESOPHAGEAL DILATION N/A 06/21/2017   Procedure: ESOPHAGEAL DILATION;  Surgeon: Danie Binder, MD;  [facility];  Service: Endoscopy;  Laterality: N/A;  . ESOPHAGOGASTRODUODENOSCOPY N/A 06/21/2017   Procedure: ESOPHAGOGASTRODUODENOSCOPY (EGD);  Surgeon: Danie Binder, MD;  [facility];  Service: Endoscopy;  Laterality: N/A;  . EUS N/A 07/28/2017   Procedure: UPPER ENDOSCOPIC ULTRASOUND (EUS) RADIAL;  Surgeon: [name], MD;  [facility];  Service: Endoscopy;  Laterality: N/A;  . EYE SURGERY Bilateral    cataract  . JEJUNOSTOMY N/A 08/03/2017   Procedure: OPEN JEJUNOSTOMY TUBE PLACEMENT;  Surgeon: Virl Cagey, MD;  Location: [facility];  Service: General;  Laterality: N/A;  . PORTACATH PLACEMENT N/A 08/03/2017   Procedure: INSERTION PORT-A-CATH;  Surgeon: Virl Cagey, MD;  Location: [facility];  Service: General;  Laterality:  N/A;     SOCIAL HISTORY:  Social History   Socioeconomic History  . Marital status: Married    Spouse name: Not on file  . Number of children: Not on file  . Years of education: Not on file  . Highest education level: Not on file  Occupational History  . Not on file  Social Needs  . Financial resource strain: Not on file  . Food insecurity:    Worry: Not on file    Inability: Not on file  .  Transportation needs:    Medical: Not on file    Non-medical: Not on file  Tobacco Use  . Smoking status: Current Some Day Smoker    Packs/day: 1.00    Types: Cigarettes  . Smokeless tobacco: Never Used  Substance and Sexual Activity  . Alcohol use: Not Currently  . Drug use: Never  . Sexual activity: Not on file  Lifestyle  . Physical activity:    Days per week: Not on file    Minutes per session: Not on file  . Stress: Not on file  Relationships  . Social connections:    Talks on phone: Not on file    Gets together: Not on file    Attends religious service: Not on file    Active member of club or organization: Not on file    Attends meetings of clubs or organizations: Not on file    Relationship status: Not on file  . Intimate partner violence:    Fear of current or ex partner: Not on file    Emotionally abused: Not on file    Physically abused: Not on file    Forced sexual activity: Not on file  Other Topics Concern  . Not on file  Social History Narrative  . Not on file    FAMILY HISTORY:  Family History  Problem Relation Age of Onset  . Hypertension Mother   . Hypertension Father   . Diabetes Father   . Colon cancer Neg Hx   . Colon polyps Neg Hx     CURRENT MEDICATIONS:  Outpatient Encounter Medications as of 09/20/2017  Medication Sig  . acetaminophen (TYLENOL) 160 MG/5ML liquid Take by mouth every 4 (four) hours as needed for fever.  Marland Kitchen atorvastatin (LIPITOR) 40 MG tablet Take 40 mg by mouth daily.   . bisacodyl (DULCOLAX) 10 MG suppository Place 1 suppository (10 mg total) rectally daily as needed for moderate constipation.  . cyclobenzaprine (FLEXERIL) 5 MG tablet Take 1 tablet (5 mg total) by mouth 3 (three) times daily as needed for muscle spasms.  Marland Kitchen docusate (COLACE) 50 MG/5ML liquid Place 10 mLs (100 mg total) into feeding tube 2 (two) times daily.  . famotidine (PEPCID) 40 MG/5ML suspension Place 2.5 mLs (20 mg total) into feeding tube 2 (two) times  daily.  Marland Kitchen lidocaine-prilocaine (EMLA) cream Apply to affected area once  . Loperamide HCl (IMODIUM A-D) 1 MG/7.5ML LIQD Take by mouth.  . metoCLOPramide (REGLAN) 5 MG/5ML solution Place 5 mLs (5 mg total) into feeding tube 4 (four) times daily -  before meals and at bedtime.  . Nutritional Supplements (FEEDING SUPPLEMENT, OSMOLITE 1.5 CAL,) LIQD Give osmolite 1.5 at 125ml/hr for 12 hours via continuous pump through J-tube per day.  Start at rate of 1101ml/hr for 12 hours and increase by 73ml daily until goal rate of 150ml reached.  . ondansetron (ZOFRAN-ODT) 4 MG disintegrating tablet Take 1 tablet (4 mg total) by mouth every 6 (six) hours  as needed for nausea or vomiting.  Marland Kitchen oxyCODONE (ROXICODONE INTENSOL) 20 MG/ML concentrated solution Place 0.3 mLs (6 mg total) into feeding tube every 4 (four) hours as needed for severe pain or breakthrough pain.  Marland Kitchen prochlorperazine (COMPAZINE) 10 MG tablet Take 1 tablet (10 mg total) by mouth every 6 (six) hours as needed for nausea or vomiting.  . sucralfate (CARAFATE) 1 GM/10ML suspension Take 10 mLs (1 g total) by mouth 4 (four) times daily -  with meals and at bedtime.  . verapamil (CALAN) 80 MG tablet Place 1 tablet (80 mg total) into feeding tube every 8 (eight) hours. Crush med to fine powder and mix with 15-30 cc of water and give through the tube.  Marland Kitchen Zolpidem Tartrate 5 MG/ACT SOLN Take 1 Act (5 mg total) by mouth at bedtime.   No facility-administered encounter medications on file as of 09/20/2017.     ALLERGIES:  Allergies  Allergen Reactions  . Adhesive [Tape] Other (See Comments)    Paper tape only     PHYSICAL EXAM:  ECOG Performance status: 1  Vitals:   09/20/17 1043  BP: 108/75  Pulse: 94  Resp: 18  Temp: 97.6 F (36.4 C)  SpO2: 98%   Filed Weights   09/20/17 1043  Weight: 169 lb 9.6 oz (76.9 kg)    Physical Exam   LABORATORY DATA:  I have reviewed the labs as listed.  CBC    Component Value Date/Time   WBC 2.6 (L)  09/20/2017 0941   RBC 4.50 09/20/2017 0941   HGB 13.6 09/20/2017 0941   HCT 40.8 09/20/2017 0941   PLT 115 (L) 09/20/2017 0941   MCV 90.7 09/20/2017 0941   MCH 30.2 09/20/2017 0941   MCHC 33.3 09/20/2017 0941   RDW 13.7 09/20/2017 0941   LYMPHSABS 0.4 (L) 09/20/2017 0941   MONOABS 0.3 09/20/2017 0941   EOSABS 0.0 09/20/2017 0941   BASOSABS 0.0 09/20/2017 0941   CMP Latest Ref Rng & Units 09/20/2017 09/13/2017 09/06/2017  Glucose 70 - 99 mg/dL 111(H) 104(H) 102(H)  BUN 6 - 20 mg/dL 16 17 19   Creatinine 0.61 - 1.24 mg/dL 0.69 0.67 0.72  Sodium 135 - 145 mmol/L 135 138 137  Potassium 3.5 - 5.1 mmol/L 4.5 4.6 4.3  Chloride 98 - 111 mmol/L 95(L) 99 100  CO2 22 - 32 mmol/L 30 30 27   Calcium 8.9 - 10.3 mg/dL 9.0 9.1 8.9  Total Protein 6.5 - 8.1 g/dL 6.7 7.0 7.0  Total Bilirubin 0.3 - 1.2 mg/dL 0.3 0.4 0.6  Alkaline Phos 38 - 126 U/L 88 95 93  AST 15 - 41 U/L 21 21 25   ALT 0 - 44 U/L 28 30 31           ASSESSMENT & PLAN:   Gastroesophageal cancer (HCC) 1.  GE junction poorly differentiated adenocarcinoma ([id number]): - EGD on 06/21/2017 shows benign-appearing stenosis which was dilated in the distal esophagus.  Localized prominent gastric folds were found in the cardia, which was biopsied.  There was also localized mild inflammation with congestion and erythema in the antrum, biopsies were taken for H. pylori testing. - Stomach biopsy shows chronic gastritis with H. pylori positive.  Esophageal gastric junction biopsy shows poorly differentiated adenocarcinoma. -I have discussed the findings of the pathology report with the patient and his wife in detail. -PET CT scan dated 07/12/2017 showed hypermetabolic uptake in the distal esophagus, extending into GE junction.  The segment is approximately 4.2 cm Papandrea. - On 07/28/2017  EGD/EUS done by Dr. Edison Nasuti showed circumferential tight, nearly obstructing stricture at GE junction, 1.3 cm thick, with the lumen of 1 to 2 mm.  UT3 NX. -Jejunostomy tube  was placed on 08/03/2017.  He lost about 12 pounds since the diagnosis.  He is receiving tube feeds via pump over more than 12 hours at nighttime, at 110-140 mL/h.  He is tolerating them very well.  He has not lost any weight in the last couple of weeks. - Weekly carbo platinum and paclitaxel with radiation started on 08/30/2017 - Week 3 of treatment was on 09/13/2017.  No major side effects noted.  His white count is slightly low at 2.6 today.  However absolute neutrophil count is okay for treatment.  He will proceed with week 4 today.  We will reevaluate him next week.      Orders placed this encounter:  No orders of the defined types were placed in this encounter.     Derek Jack, MD [place] [phone]

## 2017-09-22 ENCOUNTER — Ambulatory Visit: Payer: BLUE CROSS/BLUE SHIELD | Admitting: Cardiothoracic Surgery

## 2017-09-22 ENCOUNTER — Encounter (HOSPITAL_COMMUNITY): Payer: Self-pay | Admitting: *Deleted

## 2017-09-22 ENCOUNTER — Ambulatory Visit (HOSPITAL_COMMUNITY): Payer: BLUE CROSS/BLUE SHIELD | Admitting: Hematology

## 2017-09-22 ENCOUNTER — Other Ambulatory Visit (HOSPITAL_COMMUNITY): Payer: BLUE CROSS/BLUE SHIELD

## 2017-09-22 ENCOUNTER — Ambulatory Visit (HOSPITAL_COMMUNITY): Payer: BLUE CROSS/BLUE SHIELD

## 2017-09-22 NOTE — Progress Notes (Signed)
Medical records faxed to [facility] at [facility] per their request.  Fax number [phone]

## 2017-09-26 ENCOUNTER — Observation Stay (HOSPITAL_COMMUNITY)
Admission: EM | Admit: 2017-09-26 | Discharge: 2017-09-28 | Disposition: A | Discharge status: Home / Self Care | Payer: BLUE CROSS/BLUE SHIELD | Attending: Internal Medicine | Admitting: Internal Medicine

## 2017-09-26 ENCOUNTER — Other Ambulatory Visit: Payer: Self-pay

## 2017-09-26 ENCOUNTER — Emergency Department (HOSPITAL_COMMUNITY): Payer: BLUE CROSS/BLUE SHIELD

## 2017-09-26 ENCOUNTER — Encounter (HOSPITAL_COMMUNITY): Payer: Self-pay | Admitting: Emergency Medicine

## 2017-09-26 DIAGNOSIS — C169 Malignant neoplasm of stomach, unspecified: Secondary | ICD-10-CM | POA: Diagnosis not present

## 2017-09-26 DIAGNOSIS — R5081 Fever presenting with conditions classified elsewhere: Secondary | ICD-10-CM | POA: Diagnosis not present

## 2017-09-26 DIAGNOSIS — D709 Neutropenia, unspecified: Secondary | ICD-10-CM | POA: Insufficient documentation

## 2017-09-26 DIAGNOSIS — R509 Fever, unspecified: Secondary | ICD-10-CM | POA: Diagnosis not present

## 2017-09-26 DIAGNOSIS — F17210 Nicotine dependence, cigarettes, uncomplicated: Secondary | ICD-10-CM | POA: Diagnosis not present

## 2017-09-26 DIAGNOSIS — Z85028 Personal history of other malignant neoplasm of stomach: Secondary | ICD-10-CM

## 2017-09-26 DIAGNOSIS — Z79899 Other long term (current) drug therapy: Secondary | ICD-10-CM | POA: Insufficient documentation

## 2017-09-26 DIAGNOSIS — I10 Essential (primary) hypertension: Secondary | ICD-10-CM

## 2017-09-26 DIAGNOSIS — E43 Unspecified severe protein-calorie malnutrition: Secondary | ICD-10-CM | POA: Diagnosis present

## 2017-09-26 DIAGNOSIS — R6510 Systemic inflammatory response syndrome (SIRS) of non-infectious origin without acute organ dysfunction: Secondary | ICD-10-CM

## 2017-09-26 DIAGNOSIS — E785 Hyperlipidemia, unspecified: Secondary | ICD-10-CM

## 2017-09-26 HISTORY — DX: Thoracic aortic aneurysm, without rupture, unspecified: I71.20

## 2017-09-26 HISTORY — DX: Malignant (primary) neoplasm, unspecified: C80.1

## 2017-09-26 HISTORY — DX: Thoracic aortic aneurysm, without rupture: I71.2

## 2017-09-26 LAB — URINALYSIS, ROUTINE W REFLEX MICROSCOPIC
BILIRUBIN URINE: NEGATIVE
Glucose, UA: NEGATIVE mg/dL
Hgb urine dipstick: NEGATIVE
KETONES UR: NEGATIVE mg/dL
LEUKOCYTES UA: NEGATIVE
NITRITE: NEGATIVE
PROTEIN: NEGATIVE mg/dL
Specific Gravity, Urine: 1.025 (ref 1.005–1.030)
pH: 6 (ref 5.0–8.0)

## 2017-09-26 LAB — CBC WITH DIFFERENTIAL/PLATELET
BASOS PCT: 0 %
Basophils Absolute: 0 10*3/uL (ref 0.0–0.1)
EOS PCT: 0 %
Eosinophils Absolute: 0 10*3/uL (ref 0.0–0.7)
HEMATOCRIT: 37.1 % — AB (ref 39.0–52.0)
Hemoglobin: 12.3 g/dL — ABNORMAL LOW (ref 13.0–17.0)
LYMPHS ABS: 0.1 10*3/uL — AB (ref 0.7–4.0)
Lymphocytes Relative: 6 %
MCH: 30.2 pg (ref 26.0–34.0)
MCHC: 33.2 g/dL (ref 30.0–36.0)
MCV: 91.2 fL (ref 78.0–100.0)
MONO ABS: 0 10*3/uL — AB (ref 0.1–1.0)
Monocytes Relative: 4 %
NEUTROS ABS: 1 10*3/uL — AB (ref 1.7–7.7)
Neutrophils Relative %: 90 %
PLATELETS: 80 10*3/uL — AB (ref 150–400)
RBC: 4.07 MIL/uL — ABNORMAL LOW (ref 4.22–5.81)
RDW: 14.2 % (ref 11.5–15.5)
WBC: 1.1 10*3/uL — CL (ref 4.0–10.5)

## 2017-09-26 LAB — COMPREHENSIVE METABOLIC PANEL
ALT: 29 U/L (ref 0–44)
AST: 28 U/L (ref 15–41)
Albumin: 3.4 g/dL — ABNORMAL LOW (ref 3.5–5.0)
Alkaline Phosphatase: 96 U/L (ref 38–126)
Anion gap: 11 (ref 5–15)
BUN: 18 mg/dL (ref 6–20)
CHLORIDE: 98 mmol/L (ref 98–111)
CO2: 25 mmol/L (ref 22–32)
CREATININE: 0.73 mg/dL (ref 0.61–1.24)
Calcium: 8.7 mg/dL — ABNORMAL LOW (ref 8.9–10.3)
GFR calc Af Amer: >60 mL/min (ref >60)
GFR calc non Af Amer: >60 mL/min (ref >60)
GLUCOSE: 130 mg/dL — AB (ref 70–99)
Potassium: 4.1 mmol/L (ref 3.5–5.1)
Sodium: 134 mmol/L — ABNORMAL LOW (ref 135–145)
Total Bilirubin: 0.9 mg/dL (ref 0.3–1.2)
Total Protein: 6.4 g/dL — ABNORMAL LOW (ref 6.5–8.1)

## 2017-09-26 LAB — PROCALCITONIN: PROCALCITONIN: 0.71 ng/mL

## 2017-09-26 LAB — LACTIC ACID, PLASMA
LACTIC ACID, VENOUS: 2.7 mmol/L — AB (ref 0.5–1.9)
Lactic Acid, Venous: 3.1 mmol/L (ref 0.5–1.9)

## 2017-09-26 MED ORDER — VERAPAMIL HCL 80 MG PO TABS
80.0000 mg | ORAL_TABLET | Freq: Three times a day (TID) | ORAL | Status: DC
  Filled 2017-09-26 (×7): qty 1

## 2017-09-26 MED ORDER — VANCOMYCIN HCL IN DEXTROSE 1-5 GM/200ML-% IV SOLN: 1000.0000 mg | Freq: Once | INTRAVENOUS | Status: DC

## 2017-09-26 MED ORDER — CYCLOBENZAPRINE HCL 10 MG PO TABS: 5.0000 mg | ORAL_TABLET | Freq: Three times a day (TID) | ORAL | Status: DC | PRN

## 2017-09-26 MED ORDER — ONDANSETRON HCL 4 MG PO TABS: 4.0000 mg | ORAL_TABLET | Freq: Four times a day (QID) | ORAL | Status: DC | PRN

## 2017-09-26 MED ORDER — SODIUM CHLORIDE 0.9 % IV BOLUS (SEPSIS)
500.0000 mL | Freq: Once | INTRAVENOUS | Status: AC
  Administered 2017-09-26: 500 mL via INTRAVENOUS

## 2017-09-26 MED ORDER — PIPERACILLIN-TAZOBACTAM 3.375 G IVPB 30 MIN
3.3750 g | Freq: Once | INTRAVENOUS | Status: AC
  Administered 2017-09-26: 3.375 g via INTRAVENOUS
  Filled 2017-09-26: qty 50

## 2017-09-26 MED ORDER — ONDANSETRON HCL 4 MG/2ML IJ SOLN
4.0000 mg | Freq: Once | INTRAMUSCULAR | Status: DC
  Filled 2017-09-26: qty 2

## 2017-09-26 MED ORDER — BISACODYL 10 MG RE SUPP: 10.0000 mg | Freq: Every day | RECTAL | Status: DC | PRN

## 2017-09-26 MED ORDER — ONDANSETRON 4 MG PO TBDP: 4.0000 mg | ORAL_TABLET | Freq: Four times a day (QID) | ORAL | Status: DC | PRN

## 2017-09-26 MED ORDER — OSMOLITE 1.5 CAL PO LIQD
1000.0000 mL | ORAL | Status: DC
  Administered 2017-09-26: 1000 mL
  Filled 2017-09-26 (×2): qty 1000

## 2017-09-26 MED ORDER — ONDANSETRON HCL 4 MG/2ML IJ SOLN
4.0000 mg | Freq: Four times a day (QID) | INTRAMUSCULAR | Status: DC | PRN
  Administered 2017-09-26 (×2): 4 mg via INTRAVENOUS
  Filled 2017-09-26 (×2): qty 2

## 2017-09-26 MED ORDER — SUCRALFATE 1 GM/10ML PO SUSP
1.0000 g | Freq: Three times a day (TID) | ORAL | Status: DC
  Filled 2017-09-26: qty 10

## 2017-09-26 MED ORDER — VANCOMYCIN HCL IN DEXTROSE 750-5 MG/150ML-% IV SOLN
750.0000 mg | Freq: Three times a day (TID) | INTRAVENOUS | Status: DC
  Administered 2017-09-26 – 2017-09-28 (×5): 750 mg via INTRAVENOUS
  Filled 2017-09-26 (×9): qty 150

## 2017-09-26 MED ORDER — FAMOTIDINE 40 MG/5ML PO SUSR: 20.0000 mg | Freq: Two times a day (BID) | ORAL | Status: DC

## 2017-09-26 MED ORDER — PIPERACILLIN-TAZOBACTAM 3.375 G IVPB
3.3750 g | Freq: Three times a day (TID) | INTRAVENOUS | Status: DC
  Administered 2017-09-26 – 2017-09-28 (×5): 3.375 g via INTRAVENOUS
  Filled 2017-09-26 (×5): qty 50

## 2017-09-26 MED ORDER — ZOLPIDEM TARTRATE 5 MG PO TABS
5.0000 mg | ORAL_TABLET | Freq: Every evening | ORAL | Status: DC | PRN
  Administered 2017-09-27: 5 mg via ORAL
  Filled 2017-09-26: qty 1

## 2017-09-26 MED ORDER — DOCUSATE SODIUM 50 MG/5ML PO LIQD
100.0000 mg | Freq: Two times a day (BID) | ORAL | Status: DC
  Filled 2017-09-26 (×5): qty 10

## 2017-09-26 MED ORDER — PROCHLORPERAZINE MALEATE 5 MG PO TABS: 10.0000 mg | ORAL_TABLET | Freq: Four times a day (QID) | ORAL | Status: DC | PRN

## 2017-09-26 MED ORDER — SODIUM CHLORIDE 0.9 % IV BOLUS (SEPSIS)
1000.0000 mL | Freq: Once | INTRAVENOUS | Status: AC
  Administered 2017-09-26: 1000 mL via INTRAVENOUS

## 2017-09-26 MED ORDER — RANITIDINE HCL 150 MG/10ML PO SYRP
150.0000 mg | ORAL_SOLUTION | Freq: Two times a day (BID) | ORAL | Status: DC
  Filled 2017-09-26 (×5): qty 10

## 2017-09-26 MED ORDER — ATORVASTATIN CALCIUM 40 MG PO TABS
40.0000 mg | ORAL_TABLET | Freq: Every day | ORAL | Status: DC
  Filled 2017-09-26: qty 1

## 2017-09-26 MED ORDER — SODIUM CHLORIDE 0.9 % IV SOLN
INTRAVENOUS | Status: AC
  Administered 2017-09-26: 13:00:00 via INTRAVENOUS

## 2017-09-26 MED ORDER — PROCHLORPERAZINE EDISYLATE 10 MG/2ML IJ SOLN
10.0000 mg | Freq: Four times a day (QID) | INTRAMUSCULAR | Status: DC | PRN
  Administered 2017-09-26: 10 mg via INTRAVENOUS
  Filled 2017-09-26: qty 2

## 2017-09-26 MED ORDER — ZOLPIDEM TARTRATE 5 MG PO TABS: 5.0000 mg | ORAL_TABLET | Freq: Every day | ORAL | Status: DC

## 2017-09-26 MED ORDER — ACETAMINOPHEN 160 MG/5ML PO SOLN: 160.0000 mg | ORAL | Status: DC | PRN

## 2017-09-26 MED ORDER — VANCOMYCIN HCL 10 G IV SOLR
1500.0000 mg | Freq: Once | INTRAVENOUS | Status: AC
  Administered 2017-09-26: 1500 mg via INTRAVENOUS
  Filled 2017-09-26: qty 1500

## 2017-09-26 MED ORDER — OXYCODONE HCL 5 MG/5ML PO SOLN: 6.0000 mg | ORAL | Status: DC | PRN

## 2017-09-26 MED ORDER — METOCLOPRAMIDE HCL 5 MG/5ML PO SOLN
5.0000 mg | Freq: Three times a day (TID) | ORAL | Status: DC
  Filled 2017-09-26 (×9): qty 5

## 2017-09-26 MED ORDER — ACETAMINOPHEN 325 MG PO TABS
650.0000 mg | ORAL_TABLET | Freq: Once | ORAL | Status: AC
  Administered 2017-09-26: 650 mg via ORAL
  Filled 2017-09-26: qty 2

## 2017-09-26 NOTE — H&P (Addendum)
History and Physical    Phillip Lane:[id number] DOB: 1955-12-21 DOA: 09/26/2017  PCP: Curlene Labrum, MD   Patient coming from: Home  Chief Complaint: Fevers/Chills/Rigors  HPI: Phillip Lane is a 62 y.o. male with medical history significant for gastric cancer currently on chemo and radiation, dyslipidemia, hypertension, and prior tobacco abuse who presented to the emergency department with sudden onset fevers, chills, and rigors that began at approximately 6:30 AM while sitting outside on his porch.  He apparently had his last chemotherapy treatment on Tuesday of last week.  He continues to use his port and J-tube with no issues surrounding these access sites. He denies any dysuria, cough, headache, rashes, or any other symptoms.  He did have one episode of nausea and vomiting after which point he started to feel some better.  His wife had actually called Dr. Tomie China office who recommended coming to the emergency department for further evaluation.   ED Course: He is noted to have a temperature of 102.7 Fahrenheit with some tachycardia noted initially that has diminished after 2.5 L fluid bolus.  He has been started on vancomycin and Zosyn with blood cultures and urine cultures obtained.  Urine analysis with no findings of UTI and chest x-ray with no acute findings at this time.  Lactic acid noted to be 3.7 and WBC count of 1.1 with ANC of close to 1000.  He states that he is otherwise feeling better at this time after having an episode of emesis.  Review of Systems: All others reviewed and otherwise negative.  Past Medical History:  Diagnosis Date  . Cancer ([place])    stomach  . Hyperlipidemia   . Hypertension   . Sleep apnea, obstructive   . Thoracic aortic aneurysm [facility])     Past Surgical History:  Procedure Laterality Date  . colonoscopy     [place] 24 [place], age 61: ? POLYPS, AGE 83: ? POLYPS  . ESOPHAGEAL DILATION N/A 06/21/2017   Procedure: ESOPHAGEAL  DILATION;  Surgeon: Danie Binder, MD;  [facility];  Service: Endoscopy;  Laterality: N/A;  . ESOPHAGOGASTRODUODENOSCOPY N/A 06/21/2017   Procedure: ESOPHAGOGASTRODUODENOSCOPY (EGD);  Surgeon: Danie Binder, MD;  [facility];  Service: Endoscopy;  Laterality: N/A;  . EUS N/A 07/28/2017   Procedure: UPPER ENDOSCOPIC ULTRASOUND (EUS) RADIAL;  Surgeon: [name], MD;  [facility];  Service: Endoscopy;  Laterality: N/A;  . EYE SURGERY Bilateral    cataract  . JEJUNOSTOMY N/A 08/03/2017   Procedure: OPEN JEJUNOSTOMY TUBE PLACEMENT;  Surgeon: Virl Cagey, MD;  Location: [facility];  Service: General;  Laterality: N/A;  . PORTACATH PLACEMENT N/A 08/03/2017   Procedure: INSERTION PORT-A-CATH;  Surgeon: Virl Cagey, MD;  Location: [facility];  Service: General;  Laterality: N/A;     reports that he has been smoking cigarettes.  He has been smoking about 1.00 pack per day. He has never used smokeless tobacco. He reports that he drank alcohol. He reports that he does not use drugs.  Allergies  Allergen Reactions  . Adhesive [Tape] Other (See Comments)    Paper tape only    Family History  Problem Relation Age of Onset  . Hypertension Mother   . Hypertension Father   . Diabetes Father   . Colon cancer Neg Hx   . Colon polyps Neg Hx     Prior to Admission medications   Medication Sig Start Date End Date Taking? Authorizing Provider  acetaminophen (TYLENOL) 160 MG/5ML liquid Take by mouth every 4 (four) hours as needed for fever.    [provider]  atorvastatin (LIPITOR) 40 MG tablet Take 40 mg by mouth daily.  08/24/17   [provider]  bisacodyl (DULCOLAX) 10 MG suppository Place 1 suppository (10 mg total) rectally daily as needed for moderate constipation. 08/11/17   Virl Cagey, MD  cyclobenzaprine (FLEXERIL) 5 MG tablet Take 1 tablet (5 mg total) by mouth 3 (three) times daily as needed for muscle spasms. 08/16/17    Derek Jack, MD  docusate (COLACE) 50 MG/5ML liquid Place 10 mLs (100 mg total) into feeding tube 2 (two) times daily. 08/11/17   Virl Cagey, MD  famotidine (PEPCID) 40 MG/5ML suspension Place 2.5 mLs (20 mg total) into feeding tube 2 (two) times daily. 08/11/17   Virl Cagey, MD  lidocaine-prilocaine (EMLA) cream Apply to affected area once 08/02/17   Derek Jack, MD  Loperamide HCl (IMODIUM A-D) 1 MG/7.5ML LIQD Take by mouth.    [provider]  metoCLOPramide (REGLAN) 5 MG/5ML solution Place 5 mLs (5 mg total) into feeding tube 4 (four) times daily -  before meals and at bedtime. 08/11/17   Virl Cagey, MD  Nutritional Supplements (FEEDING SUPPLEMENT, OSMOLITE 1.5 CAL,) LIQD Give osmolite 1.5 at 146ml/hr for 12 hours via continuous pump through J-tube per day.  Start at rate of 137ml/hr for 12 hours and increase by 16ml daily until goal rate of 127ml reached. 08/19/17   Derek Jack, MD  ondansetron (ZOFRAN-ODT) 4 MG disintegrating tablet Take 1 tablet (4 mg total) by mouth every 6 (six) hours as needed for nausea or vomiting. 08/11/17   Virl Cagey, MD  oxyCODONE (ROXICODONE INTENSOL) 20 MG/ML concentrated solution Place 0.3 mLs (6 mg total) into feeding tube every 4 (four) hours as needed for severe pain or breakthrough pain. 08/11/17   Virl Cagey, MD  prochlorperazine (COMPAZINE) 10 MG tablet Take 1 tablet (10 mg total) by mouth every 6 (six) hours as needed for nausea or vomiting. 08/26/17   Derek Jack, MD  sucralfate (CARAFATE) 1 GM/10ML suspension Take 10 mLs (1 g total) by mouth 4 (four) times daily -  with meals and at bedtime. 08/11/17   Virl Cagey, MD  verapamil (CALAN) 80 MG tablet Place 1 tablet (80 mg total) into feeding tube every 8 (eight) hours. Crush med to fine powder and mix with 15-30 cc of water and give through the tube. 08/11/17   Virl Cagey, MD  Zolpidem Tartrate 5 MG/ACT SOLN Take 1 Act (5  mg total) by mouth at bedtime. 08/19/17   Derek Jack, MD    Physical Exam: Vitals:   09/26/17 0818 09/26/17 0903 09/26/17 1030 09/26/17 1100  BP: 124/78  112/71 112/72  Pulse: (!) 129  99 99  Resp: (!) 22  12 11   Temp: (!) 100.5 F (38.1 C) (!) 102.7 F (39.3 C)    TempSrc: Oral Rectal    SpO2: 97%  97% 98%  Weight:      Height:        Constitutional: NAD, calm, comfortable Vitals:   09/26/17 0818 09/26/17 0903 09/26/17 1030 09/26/17 1100  BP: 124/78  112/71 112/72  Pulse: (!) 129  99 99  Resp: (!) 22  12 11   Temp: (!) 100.5 F (38.1 C) (!) 102.7 F (39.3 C)    TempSrc: Oral Rectal    SpO2: 97%  97% 98%  Weight:  Height:       Eyes: lids and conjunctivae normal ENMT: Mucous membranes are moist.  Neck: normal, supple Respiratory: clear to auscultation bilaterally. Normal respiratory effort. No accessory muscle use.  Cardiovascular: Regular rate and rhythm, no murmurs. No extremity edema. Abdomen: no tenderness, no distention. Bowel sounds positive.  Musculoskeletal:  No joint deformity upper and lower extremities.   Skin: no rashes, lesions, ulcers.  G-tube site clean dry and intact as well as port site over right side of chest. Psychiatric: Normal judgment and insight. Alert and oriented x 3. Normal mood.   Labs on Admission: I have personally reviewed following labs and imaging studies  CBC: Recent Labs  Lab 09/20/17 0941 09/26/17 0907  WBC 2.6* 1.1*  NEUTROABS 1.9 1.0*  HGB 13.6 12.3*  HCT 40.8 37.1*  MCV 90.7 91.2  PLT 115* 80*   Basic Metabolic Panel: Recent Labs  Lab 09/20/17 0941 09/26/17 0907  NA 135 134*  K 4.5 4.1  CL 95* 98  CO2 30 25  GLUCOSE 111* 130*  BUN 16 18  CREATININE 0.69 0.73  CALCIUM 9.0 8.7*  MG 2.2  --    GFR: Estimated Creatinine Clearance: 102.7 mL/min (by C-G formula based on SCr of 0.73 mg/dL). Liver Function Tests: Recent Labs  Lab 09/20/17 0941 09/26/17 0907  AST 21 28  ALT 28 29  ALKPHOS 88 96    BILITOT 0.3 0.9  PROT 6.7 6.4*  ALBUMIN 3.5 3.4*   No results for input(s): LIPASE, AMYLASE in the last 168 hours. No results for input(s): AMMONIA in the last 168 hours. Coagulation Profile: No results for input(s): INR, PROTIME in the last 168 hours. Cardiac Enzymes: No results for input(s): CKTOTAL, CKMB, CKMBINDEX, TROPONINI in the last 168 hours. BNP (last 3 results) No results for input(s): PROBNP in the last 8760 hours. HbA1C: No results for input(s): HGBA1C in the last 72 hours. CBG: No results for input(s): GLUCAP in the last 168 hours. Lipid Profile: No results for input(s): CHOL, HDL, LDLCALC, TRIG, CHOLHDL, LDLDIRECT in the last 72 hours. Thyroid Function Tests: No results for input(s): TSH, T4TOTAL, FREET4, T3FREE, THYROIDAB in the last 72 hours. Anemia Panel: No results for input(s): VITAMINB12, FOLATE, FERRITIN, TIBC, IRON, RETICCTPCT in the last 72 hours. Urine analysis:    Component Value Date/Time   COLORURINE AMBER (A) 09/26/2017 1010   APPEARANCEUR HAZY (A) 09/26/2017 1010   LABSPEC 1.025 09/26/2017 1010   PHURINE 6.0 09/26/2017 1010   GLUCOSEU NEGATIVE 09/26/2017 1010   HGBUR NEGATIVE 09/26/2017 1010   BILIRUBINUR NEGATIVE 09/26/2017 1010   KETONESUR NEGATIVE 09/26/2017 1010   PROTEINUR NEGATIVE 09/26/2017 1010   NITRITE NEGATIVE 09/26/2017 1010   LEUKOCYTESUR NEGATIVE 09/26/2017 1010    Radiological Exams on Admission: Dg Chest Port 1 View  Result Date: 09/26/2017 CLINICAL DATA:  Fever and nausea. Cough. Ongoing chemotherapy for gastric cancer. EXAM: PORTABLE CHEST 1 VIEW COMPARISON:  08/06/2017 FINDINGS: A right subclavian Port-A-Cath terminates over the lower SVC. The cardiac silhouette is borderline enlarged. Aortic atherosclerosis is noted. The patient has taken a greater inspiration than on the prior study with improved aeration of the lung bases. No airspace consolidation, edema, sizable pleural effusion, or pneumothorax is identified. No acute  osseous abnormality is seen. IMPRESSION: Improved basilar aeration without evidence of active disease. Electronically Signed   By: Logan Bores M.D.   On: 09/26/2017 09:17    EKG: Independently reviewed. ST 128bpm.  Assessment/Plan Principal Problem:   SIRS (systemic inflammatory response syndrome) (  [place]) Active Problems:   Gastric cancer (HCC)   Protein-calorie malnutrition, severe   Essential hypertension   Hyperlipidemia    1. SIRS criteria with fever.  No current signs of infection noted but will maintain on empiric vancomycin and Zosyn for now and follow cultures.  Repeat lactic acid in a.m. and maintain on gentle IV fluid.  2. Gastric cancer on chemotherapy and radiation.  Patient noted to have neutropenia and will consult hematology Dr. Delton Coombes who is familiar with this patient for further assistance with management while patient is in the hospital. 3. Essential hypertension.  Controlled. 4. Dyslipidemia.  Does not take statin. 5. GERD.  Continue on famotidine suspension.   DVT prophylaxis: SCDs Code Status: Full Family Communication: Wife at bedside Disposition Plan:Evaluation of fever Consults called:Hematology Admission status: Observation, Tele   Remington Highbaugh Darleen Crocker DO [facility] Pager [phone]  If 7PM-7AM, please contact night-coverage [email or web address] Password [email or web address]  09/26/2017, 11:14 AM

## 2017-09-26 NOTE — ED Notes (Signed)
Have notified pharmacy that pt will be holding.

## 2017-09-26 NOTE — ED Notes (Signed)
Pharmacy has not verified admission medication.

## 2017-09-26 NOTE — Progress Notes (Signed)
Pharmacy Antibiotic Note  Phillip Lane is a 62 y.o. male admitted on 09/26/2017 with sepsis.  Pharmacy has been consulted for Vancomycin and Zosyn dosing.  Plan: Vancomycin 1500 mg IV x 1 dose Vancomycin 750 mg IV every 8 hours.  Goal trough 15-20 mcg/mL. Zosyn 3.375g IV q8h (4 hour infusion).  Monitor labs, c/s, and vanco trough as indicated  Height: 5\' 10"  (177.8 cm) Weight: 169 lb (76.7 kg) IBW/kg (Calculated) : 73  Temp (24hrs), Avg:101.6 F (38.7 C), Min:100.5 F (38.1 C), Max:102.7 F (39.3 C)  Recent Labs  Lab 09/20/17 0941  WBC 2.6*  CREATININE 0.69    Estimated Creatinine Clearance: 102.7 mL/min (by C-G formula based on SCr of 0.69 mg/dL).    Allergies  Allergen Reactions  . Adhesive [Tape] Other (See Comments)    Paper tape only    Antimicrobials this admission: Vanco 8/5 >>  Zosyn 8/5 >>   Dose adjustments this admission: N/A  Microbiology results: 8/5 BCx: pending 8/5 UCx: pending    Thank you for allowing pharmacy to be a part of this patient's care.  Margot Ables, PharmD Clinical Pharmacist 09/26/2017 9:10 AM

## 2017-09-26 NOTE — ED Notes (Signed)
CRITICAL VALUE ALERT  Critical Value:  Lactic Acid - 2.7  Date & Time Notied:  09/26/17   1128  Provider Notified: Dr Reather Converse  Orders Received/Actions taken: None at this time.

## 2017-09-26 NOTE — ED Notes (Signed)
Vancomycin slowed down due to pt feeling flushed

## 2017-09-26 NOTE — ED Notes (Addendum)
Per family and patient, Phillip Lane does not take these medications regularly. Was put on them but was taken off. Family requests me to ask admitting doctor. Also, pt does not start feeding until 4pm.

## 2017-09-26 NOTE — ED Notes (Signed)
Date and time results received: 09/26/17 9:53 AM  (use smartphrase ".now" to insert current time)  Test: WBC Critical Value: 1.6  Name of Provider Notified: [facility]itz  Orders Received? Or Actions Taken?: Orders Received - See Orders for details

## 2017-09-26 NOTE — ED Notes (Signed)
Have paged pharmacy for medication reconciliation

## 2017-09-26 NOTE — ED Notes (Signed)
Phlebotomist at bedside.

## 2017-09-26 NOTE — ED Triage Notes (Signed)
Pt's wife reports fever starting last night with chills.  Pt is currently on chemo and radiation for stomach cancer.  Chemo on Tuesdays and radiation 5 days weekly.  Reported low WBC count at last chemo tx.

## 2017-09-26 NOTE — ED Notes (Signed)
Notified pt's family he would be going upstairs

## 2017-09-26 NOTE — ED Notes (Signed)
CRITICAL VALUE ALERT  Critical Value:  Lactic acid 3.1  Date & Time Notied:  09/26/17 0940  Provider Notified: dr Reather Converse  Orders Received/Actions taken:

## 2017-09-26 NOTE — ED Provider Notes (Signed)
[facility] EMERGENCY DEPARTMENT Provider Note   CSN: [id number] Arrival date & time: 09/26/17  0803     History   Chief Complaint Chief Complaint  Patient presents with  . Fever    HPI Phillip Lane is a 62 y.o. male.  Patient with history of stomach cancer currently on chemo and radiation 5 days weekly, last chemo Tuesday presents with fever and low white blood cell count during last session.  Patient denies any significant cough, urinary symptoms or skin rashes.  Patient has a port and a J-tube and has not had issues with it.  Cigarette smoker.  Fever and chills started today.     Past Medical History:  Diagnosis Date  . Cancer ([place])    stomach  . Hyperlipidemia   . Hypertension   . Sleep apnea, obstructive   . Thoracic aortic aneurysm [facility])     Patient Active Problem List   Diagnosis Date Noted  . Essential hypertension 09/26/2017  . Hyperlipidemia 09/26/2017  . SIRS (systemic inflammatory response syndrome) ([place]) 09/26/2017  . Protein-calorie malnutrition, severe 08/10/2017  . Gastric cancer ([place]) 08/03/2017  . 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 Food impaction of esophagus 06/21/2017  . Gastritis and gastroduodenitis   . Esophageal dysphagia     Past Surgical History:  Procedure Laterality Date  . colonoscopy     [place] 54 polyps removed, age 59: ? POLYPS, AGE 70: ? POLYPS  . ESOPHAGEAL DILATION N/A 06/21/2017   Procedure: ESOPHAGEAL DILATION;  Surgeon: Danie Binder, MD;  Location: [facility] ENDO SUITE;  Service: Endoscopy;  Laterality: N/A;  . ESOPHAGOGASTRODUODENOSCOPY N/A 06/21/2017   Procedure: ESOPHAGOGASTRODUODENOSCOPY (EGD);  Surgeon: Danie Binder, MD;  Location: [facility] ENDO SUITE;  Service: Endoscopy;  Laterality: N/A;  . EUS N/A 07/28/2017   Procedure: UPPER ENDOSCOPIC ULTRASOUND (EUS) RADIAL;  Surgeon: [name], MD;  Location: [facility] ENDOSCOPY;  Service: Endoscopy;  Laterality: N/A;  . EYE SURGERY Bilateral    cataract  .  JEJUNOSTOMY N/A 08/03/2017   Procedure: OPEN JEJUNOSTOMY TUBE PLACEMENT;  Surgeon: Virl Cagey, MD;  Location: [facility] ORS;  Service: General;  Laterality: N/A;  . PORTACATH PLACEMENT N/A 08/03/2017   Procedure: INSERTION PORT-A-CATH;  Surgeon: Virl Cagey, MD;  Location: [facility] ORS;  Service: General;  Laterality: N/A;        Home Medications    Prior to Admission medications   Medication Sig Start Date End Date Taking? Authorizing Provider  acetaminophen (TYLENOL) 160 MG/5ML liquid Take by mouth every 4 (four) hours as needed for fever.    [provider]  atorvastatin (LIPITOR) 40 MG tablet Take 40 mg by mouth daily.  08/24/17   [provider]  bisacodyl (DULCOLAX) 10 MG suppository Place 1 suppository (10 mg total) rectally daily as needed for moderate constipation. 08/11/17   Virl Cagey, MD  cyclobenzaprine (FLEXERIL) 5 MG tablet Take 1 tablet (5 mg total) by mouth 3 (three) times daily as needed for muscle spasms. 08/16/17   Derek Jack, MD  docusate (COLACE) 50 MG/5ML liquid Place 10 mLs (100 mg total) into feeding tube 2 (two) times daily. 08/11/17   Virl Cagey, MD  famotidine (PEPCID) 40 MG/5ML suspension Place 2.5 mLs (20 mg total) into feeding tube 2 (two) times daily. 08/11/17   Virl Cagey, MD  lidocaine-prilocaine (EMLA) cream Apply to affected area once 08/02/17   Derek Jack, MD  Loperamide HCl (IMODIUM A-D) 1 MG/7.5ML LIQD Take by mouth.  [provider]  metoCLOPramide (REGLAN) 5 MG/5ML solution Place 5 mLs (5 mg total) into feeding tube 4 (four) times daily -  before meals and at bedtime. 08/11/17   Virl Cagey, MD  Nutritional Supplements (FEEDING SUPPLEMENT, OSMOLITE 1.5 CAL,) LIQD Give osmolite 1.5 at 131ml/hr for 12 hours via continuous pump through J-tube per day.  Start at rate of 126ml/hr for 12 hours and increase by 34ml daily until goal rate of 179ml reached. 08/19/17   Derek Jack, MD    ondansetron (ZOFRAN-ODT) 4 MG disintegrating tablet Take 1 tablet (4 mg total) by mouth every 6 (six) hours as needed for nausea or vomiting. 08/11/17   Virl Cagey, MD  oxyCODONE (ROXICODONE INTENSOL) 20 MG/ML concentrated solution Place 0.3 mLs (6 mg total) into feeding tube every 4 (four) hours as needed for severe pain or breakthrough pain. 08/11/17   Virl Cagey, MD  prochlorperazine (COMPAZINE) 10 MG tablet Take 1 tablet (10 mg total) by mouth every 6 (six) hours as needed for nausea or vomiting. 08/26/17   Derek Jack, MD  sucralfate (CARAFATE) 1 GM/10ML suspension Take 10 mLs (1 g total) by mouth 4 (four) times daily -  with meals and at bedtime. 08/11/17   Virl Cagey, MD  verapamil (CALAN) 80 MG tablet Place 1 tablet (80 mg total) into feeding tube every 8 (eight) hours. Crush med to fine powder and mix with 15-30 cc of water and give through the tube. 08/11/17   Virl Cagey, MD  Zolpidem Tartrate 5 MG/ACT SOLN Take 1 Act (5 mg total) by mouth at bedtime. 08/19/17   Derek Jack, MD    Family History Family History  Problem Relation Age of Onset  . Hypertension Mother   . Hypertension Father   . Diabetes Father   . Colon cancer Neg Hx   . Colon polyps Neg Hx     Social History Social History   Tobacco Use  . Smoking status: Current Some Day Smoker    Packs/day: 1.00    Types: Cigarettes  . Smokeless tobacco: Never Used  Substance Use Topics  . Alcohol use: Not Currently  . Drug use: Never     Allergies   Adhesive [tape]   Review of Systems Review of Systems  Constitutional: Positive for appetite change, chills and fever.  HENT: Negative for congestion.   Eyes: Negative for visual disturbance.  Respiratory: Negative for shortness of breath.   Cardiovascular: Negative for chest pain.  Gastrointestinal: Positive for nausea. Negative for abdominal pain and vomiting.  Genitourinary: Negative for dysuria and flank pain.   Musculoskeletal: Negative for back pain, neck pain and neck stiffness.  Skin: Negative for rash.  Neurological: Positive for light-headedness. Negative for headaches.     Physical Exam Updated Vital Signs BP 112/72   Pulse 99   Temp (!) 102.7 F (39.3 C) (Rectal)   Resp 11   Ht 5\' 10"  (1.778 m)   Wt 76.7 kg (169 lb)   SpO2 98%   BMI 24.25 kg/m   Physical Exam  Constitutional: He is oriented to person, place, and time. He appears well-developed and well-nourished.  HENT:  Head: Normocephalic and atraumatic.  Dry mucous membranes  Eyes: Conjunctivae are normal. Right eye exhibits no discharge. Left eye exhibits no discharge.  Neck: Normal range of motion. Neck supple. No tracheal deviation present.  Cardiovascular: Regular rhythm. Tachycardia present.  Pulmonary/Chest: Effort normal and breath sounds normal.  Abdominal: Soft. He exhibits no distension.  There is no tenderness. There is no guarding.  Musculoskeletal: He exhibits no edema.  Neurological: He is alert and oriented to person, place, and time. No cranial nerve deficit.  Skin: Skin is warm. No rash noted.  No rash around J-tube site or right chest port.  Psychiatric: He has a normal mood and affect.  Nursing note and vitals reviewed.    ED Treatments / Results  Labs (all labs ordered are listed, but only abnormal results are displayed) Labs Reviewed  COMPREHENSIVE METABOLIC PANEL - Abnormal; Notable for the following components:      Result Value   Sodium 134 (*)    Glucose, Bld 130 (*)    Calcium 8.7 (*)    Total Protein 6.4 (*)    Albumin 3.4 (*)    All other components within normal limits  CBC WITH DIFFERENTIAL/PLATELET - Abnormal; Notable for the following components:   WBC 1.1 (*)    RBC 4.07 (*)    Hemoglobin 12.3 (*)    HCT 37.1 (*)    Platelets 80 (*)    Neutro Abs 1.0 (*)    Lymphs Abs 0.1 (*)    Monocytes Absolute 0.0 (*)    All other components within normal limits  URINALYSIS, ROUTINE  W REFLEX MICROSCOPIC - Abnormal; Notable for the following components:   Color, Urine AMBER (*)    APPearance HAZY (*)    All other components within normal limits  LACTIC ACID, PLASMA - Abnormal; Notable for the following components:   Lactic Acid, Venous 3.1 (*)    All other components within normal limits  CULTURE, BLOOD (ROUTINE X 2)  CULTURE, BLOOD (ROUTINE X 2)  URINE CULTURE  LACTIC ACID, PLASMA    EKG None  Radiology Dg Chest Port 1 View  Result Date: 09/26/2017 CLINICAL DATA:  Fever and nausea. Cough. Ongoing chemotherapy for gastric cancer. EXAM: PORTABLE CHEST 1 VIEW COMPARISON:  08/06/2017 FINDINGS: A right subclavian Port-A-Cath terminates over the lower SVC. The cardiac silhouette is borderline enlarged. Aortic atherosclerosis is noted. The patient has taken a greater inspiration than on the prior study with improved aeration of the lung bases. No airspace consolidation, edema, sizable pleural effusion, or pneumothorax is identified. No acute osseous abnormality is seen. IMPRESSION: Improved basilar aeration without evidence of active disease. Electronically Signed   By: Logan Bores M.D.   On: 09/26/2017 09:17    Procedures .Critical Care Performed by: Elnora Morrison, MD Authorized by: Elnora Morrison, MD   Critical care provider statement:    Critical care time (minutes):  40   Critical care start time:  09/26/2017 8:45 AM   Critical care end time:  09/26/2017 9:25 AM   Critical care time was exclusive of:  Separately billable procedures and treating other patients and teaching time   Critical care was necessary to treat or prevent imminent or life-threatening deterioration of the following conditions:  Sepsis   Critical care was time spent personally by me on the following activities:  Ordering and review of laboratory studies, ordering and review of radiographic studies, re-evaluation of patient's condition, evaluation of patient's response to treatment and examination of  patient   I assumed direction of critical care for this patient from another provider in my specialty: no     (including critical care time)  Medications Ordered in ED Medications  vancomycin (VANCOCIN) 1,500 mg in sodium chloride 0.9 % 500 mL IVPB (1,500 mg Intravenous New Bag/Given 09/26/17 1007)  piperacillin-tazobactam (ZOSYN) IVPB 3.375 g (has  no administration in time range)  ondansetron (ZOFRAN) injection 4 mg (has no administration in time range)  sodium chloride 0.9 % bolus 1,000 mL (0 mLs Intravenous Stopped 09/26/17 1036)    And  sodium chloride 0.9 % bolus 1,000 mL (0 mLs Intravenous Stopped 09/26/17 1037)    And  sodium chloride 0.9 % bolus 500 mL (500 mLs Intravenous New Bag/Given 09/26/17 1008)  piperacillin-tazobactam (ZOSYN) IVPB 3.375 g (0 g Intravenous Stopped 09/26/17 0950)  acetaminophen (TYLENOL) tablet 650 mg (650 mg Oral Given 09/26/17 1046)     Initial Impression / Assessment and Plan / ED Course  I have reviewed the triage vital signs and the nursing notes.  Pertinent labs & imaging results that were available during my care of the patient were reviewed by me and considered in my medical decision making (see chart for details).    Patient with stomach cancer actively receiving chemo presents with fever chills with clinical concern for sepsis.  Patient is immunosuppressed, sepsis order set utilized, broad antibiotics and cultures ordered.  IV fluids 30 cc/kg ordered.   Patient's heart rate improved in the ER.  Patient is having flushed symptoms of vancomycin instructed nursing staff to decrease the rate and monitor closely.  Zofran given for nausea.  Discussed with hospitalist for admission.  Blood pressure stable in the ER.  Repeat lactate pending. Blood work reviewed neutropenia along with fever. The patients results and plan were reviewed and discussed.   Any x-rays performed were independently reviewed by myself.   Differential diagnosis were considered with the  presenting HPI.  Medications  vancomycin (VANCOCIN) 1,500 mg in sodium chloride 0.9 % 500 mL IVPB (1,500 mg Intravenous New Bag/Given 09/26/17 1007)  piperacillin-tazobactam (ZOSYN) IVPB 3.375 g (has no administration in time range)  ondansetron (ZOFRAN) injection 4 mg (has no administration in time range)  sodium chloride 0.9 % bolus 1,000 mL (0 mLs Intravenous Stopped 09/26/17 1036)    And  sodium chloride 0.9 % bolus 1,000 mL (0 mLs Intravenous Stopped 09/26/17 1037)    And  sodium chloride 0.9 % bolus 500 mL (500 mLs Intravenous New Bag/Given 09/26/17 1008)  piperacillin-tazobactam (ZOSYN) IVPB 3.375 g (0 g Intravenous Stopped 09/26/17 0950)  acetaminophen (TYLENOL) tablet 650 mg (650 mg Oral Given 09/26/17 1046)    Vitals:   09/26/17 0818 09/26/17 0903 09/26/17 1030 09/26/17 1100  BP: 124/78  112/71 112/72  Pulse: (!) 129  99 99  Resp: (!) 22  12 11   Temp: (!) 100.5 F (38.1 C) (!) 102.7 F (39.3 C)    TempSrc: Oral Rectal    SpO2: 97%  97% 98%  Weight:      Height:        Final diagnoses:  History of stomach cancer  Fever in adult  Neutropenic fever (HCC)    Admission/ observation were discussed with the admitting physician, patient and/or family and they are comfortable with the plan.   Final Clinical Impressions(s) / ED Diagnoses   Final diagnoses:  History of stomach cancer  Fever in adult  Neutropenic fever [facility])    ED Discharge Orders    None       Elnora Morrison, MD 09/26/17 1123

## 2017-09-27 ENCOUNTER — Ambulatory Visit (HOSPITAL_COMMUNITY): Payer: BLUE CROSS/BLUE SHIELD

## 2017-09-27 ENCOUNTER — Encounter (HOSPITAL_COMMUNITY): Payer: BLUE CROSS/BLUE SHIELD | Admitting: Dietician

## 2017-09-27 ENCOUNTER — Other Ambulatory Visit (HOSPITAL_COMMUNITY): Payer: BLUE CROSS/BLUE SHIELD

## 2017-09-27 DIAGNOSIS — D709 Neutropenia, unspecified: Secondary | ICD-10-CM | POA: Diagnosis not present

## 2017-09-27 DIAGNOSIS — C160 Malignant neoplasm of cardia: Secondary | ICD-10-CM

## 2017-09-27 DIAGNOSIS — R6510 Systemic inflammatory response syndrome (SIRS) of non-infectious origin without acute organ dysfunction: Secondary | ICD-10-CM | POA: Diagnosis not present

## 2017-09-27 DIAGNOSIS — R5081 Fever presenting with conditions classified elsewhere: Secondary | ICD-10-CM | POA: Diagnosis not present

## 2017-09-27 LAB — COMPREHENSIVE METABOLIC PANEL
ALT: 31 U/L (ref 0–44)
AST: 24 U/L (ref 15–41)
Albumin: 2.8 g/dL — ABNORMAL LOW (ref 3.5–5.0)
Alkaline Phosphatase: 79 U/L (ref 38–126)
Anion gap: 5 (ref 5–15)
BUN: 12 mg/dL (ref 6–20)
CALCIUM: 8.1 mg/dL — AB (ref 8.9–10.3)
CO2: 27 mmol/L (ref 22–32)
Chloride: 103 mmol/L (ref 98–111)
Creatinine, Ser: 0.61 mg/dL (ref 0.61–1.24)
GFR calc Af Amer: >60 mL/min (ref >60)
Glucose, Bld: 100 mg/dL — ABNORMAL HIGH (ref 70–99)
Potassium: 4 mmol/L (ref 3.5–5.1)
Sodium: 135 mmol/L (ref 135–145)
Total Bilirubin: 0.4 mg/dL (ref 0.3–1.2)
Total Protein: 5.6 g/dL — ABNORMAL LOW (ref 6.5–8.1)

## 2017-09-27 LAB — GLUCOSE, CAPILLARY
GLUCOSE-CAPILLARY: 91 mg/dL (ref 70–99)
GLUCOSE-CAPILLARY: 85 mg/dL (ref 70–99)

## 2017-09-27 LAB — CBC
HCT: 33.2 % — ABNORMAL LOW (ref 39.0–52.0)
Hemoglobin: 11 g/dL — ABNORMAL LOW (ref 13.0–17.0)
MCH: 30.1 pg (ref 26.0–34.0)
MCHC: 33.1 g/dL (ref 30.0–36.0)
MCV: 91 fL (ref 78.0–100.0)
PLATELETS: 71 10*3/uL — AB (ref 150–400)
RBC: 3.65 MIL/uL — AB (ref 4.22–5.81)
RDW: 14.4 % (ref 11.5–15.5)
WBC: 1.5 10*3/uL — AB (ref 4.0–10.5)

## 2017-09-27 LAB — PROCALCITONIN: PROCALCITONIN: 8.32 ng/mL

## 2017-09-27 LAB — URINE CULTURE: Culture: <10000 — AB

## 2017-09-27 LAB — LACTIC ACID, PLASMA: LACTIC ACID, VENOUS: 1.4 mmol/L (ref 0.5–1.9)

## 2017-09-27 LAB — HIV ANTIBODY (ROUTINE TESTING W REFLEX): HIV SCREEN 4TH GENERATION: NONREACTIVE

## 2017-09-27 MED ORDER — OSMOLITE 1.5 CAL PO LIQD
1000.0000 mL | ORAL | Status: DC
  Administered 2017-09-27: 1000 mL
  Filled 2017-09-27 (×4): qty 1000

## 2017-09-27 MED ORDER — LOPERAMIDE HCL 1 MG/7.5ML PO LIQD
1.0000 mg | Freq: Three times a day (TID) | ORAL | Status: DC | PRN
  Administered 2017-09-27: 1 mg
  Filled 2017-09-27 (×2): qty 7.5

## 2017-09-27 MED ORDER — FREE WATER
200.0000 mL | Status: DC
  Administered 2017-09-27 – 2017-09-28 (×7): 200 mL

## 2017-09-27 MED ORDER — FILGRASTIM 480 MCG/0.8ML IJ SOSY
480.0000 ug | PREFILLED_SYRINGE | Freq: Once | INTRAMUSCULAR | Status: AC
  Administered 2017-09-27: 480 ug via SUBCUTANEOUS
  Filled 2017-09-27: qty 0.8

## 2017-09-27 MED ORDER — JEVITY 1.2 CAL PO LIQD: 1000.0000 mL | ORAL | Status: DC

## 2017-09-27 MED ORDER — LOPERAMIDE HCL 1 MG/7.5ML PO LIQD: Freq: Three times a day (TID) | ORAL | Status: DC | PRN

## 2017-09-27 MED ORDER — FREE WATER: 250.0000 mL | Status: DC

## 2017-09-27 NOTE — Care Management Note (Signed)
Case Management Note  Patient Details  Name: Phillip Lane MRN: [id number] Date of Birth: [birth year]  Pt from home with wife. Has feeding tube and gets supplies through [facility]. Pt has advanced met cancer and has consult with onc. Pt needs suction for home use, would like to cont to use [facility] for DME needs. Vaughan Basta, [facility] rep, given referral. Anticipate DC home tomorrow.   Expected Discharge Date:     09/28/17             Expected Discharge Plan:  Home/Self Care  In-House Referral:  NA  Discharge planning Services  CM Consult  Post Acute Care Choice:  NA Choice offered to:  NA  DME Arranged:  Suction DME Agency:  [place]:    [facility] Agency:     Status of Service:  Completed, signed off  If discussed at [place] of Stay Meetings, dates discussed:    Additional Comments:  Sherald Barge, RN 09/27/2017, 3:41 PM

## 2017-09-27 NOTE — Plan of Care (Signed)
Resting quietly in bed,no com-plaints voiced,enteric precautions maintained,callbell within reach.

## 2017-09-27 NOTE — Progress Notes (Signed)
PROGRESS NOTE    TRAMELL PIECHOTA  WUJ:[id number] DOB: 12/09/1955 DOA: 09/26/2017 PCP: Curlene Labrum, MD   Brief Narrative:   Phillip Lane is a 62 y.o. male with medical history significant for gastric cancer currently on chemo and radiation, dyslipidemia, hypertension, and prior tobacco abuse who presented to the emergency department with sudden onset fevers, chills, and rigors that began at approximately 6:30 AM while sitting outside on his porch.  He apparently had his last chemotherapy treatment on Tuesday of last week.  He has been admitted with fever and positive sirs criteria with no obvious sign of infection noted yet.  His procalcitonin was elevated and his blood cultures have demonstrated no growth in less than 24 hours.  His fever has resolved for approximately 24 hours at this point.  He continues to have symptoms of ongoing nausea and high level of oral secretions which is not unusual for him.  Oncology evaluation still pending.   Assessment & Plan:   Principal Problem:   SIRS (systemic inflammatory response syndrome) (HCC) Active Problems:   Gastric cancer (HCC)   Protein-calorie malnutrition, severe   Essential hypertension   Hyperlipidemia   Fever   1. SIRS criteria with fever-improving.  No current signs of infection noted with blood cultures negative less than 24 hours and no fever in the last 24 hours.  Urine culture with less than 10,000 colony-forming units.  Would continue to maintain on IV antibiotics due to elevated procalcitonin and ensure that there is no growth and blood cultures by a.m. and patient maintains clinical improvement prior to discharge. 2. Gastric cancer on chemotherapy and radiation.  Patient noted to have neutropenia and will consult hematology Dr. Delton Coombes who is familiar with this patient for further assistance with management while patient is in the hospital.  Appreciate further recommendations given his symptoms. 3. Essential hypertension.   Controlled. 4. Dyslipidemia.  Does not take statin. 5. GERD.  Continue on famotidine suspension.   DVT prophylaxis:SCDs Code Status: Full Family Communication: Wife at bedside Disposition Plan: Hematology evaluation pending.  Continue current broad-spectrum IV antibiotics until blood cultures demonstrate no growth for over 24 hours and no fever noted for greater than 24 hours.  Likely discharge in am if he remains clinically improved with negative blood cultures.  He will have home suction device delivered.   Consultants:   Heme/Onc  Procedures:   None  Antimicrobials:   Vancomycin and Zosyn 8/5->   Subjective: Patient seen and evaluated today with no new acute complaints or concerns. No acute concerns or events noted overnight.  He continues to have nausea as well as heartburn symptoms and high amounts of oral secretions.  Much of this is his usual baseline.  Objective: Vitals:   09/26/17 1500 09/26/17 1703 09/26/17 2241 09/27/17 0558  BP: 114/72 139/82 111/70 118/78  Pulse: 82 83 (!) 105 98  Resp: (!) 24 18 18 18   Temp:  98.2 F (36.8 C) 98.9 F (37.2 C) 98.6 F (37 C)  TempSrc:  Oral Oral Oral  SpO2: 97% 98% 96% 97%  Weight:      Height:        Intake/Output Summary (Last 24 hours) at 09/27/2017 1331 Last data filed at 09/27/2017 0616 Gross per 24 hour  Intake 290 ml  Output -  Net 290 ml   Filed Weights   09/26/17 0815  Weight: 76.7 kg (169 lb)    Examination:  General exam: Appears calm and comfortable  Respiratory system: Clear  to auscultation. Respiratory effort normal. Cardiovascular system: S1 & S2 heard, RRR. No JVD, murmurs, rubs, gallops or clicks. No pedal edema. Gastrointestinal system: Abdomen is nondistended, soft and nontender. No organomegaly or masses felt. Normal bowel sounds heard. J tube, C/D/I Central nervous system: Alert and oriented. No focal neurological deficits. Extremities: Symmetric 5 x 5 power. Skin: No rashes, lesions or  ulcers Psychiatry: Judgement and insight appear normal. Mood & affect appropriate.     Data Reviewed: I have personally reviewed following labs and imaging studies  CBC: Recent Labs  Lab 09/26/17 0907 09/27/17 0614  WBC 1.1* 1.5*  NEUTROABS 1.0*  --   HGB 12.3* 11.0*  HCT 37.1* 33.2*  MCV 91.2 91.0  PLT 80* 71*   Basic Metabolic Panel: Recent Labs  Lab 09/26/17 0907 09/27/17 0614  NA 134* 135  K 4.1 4.0  CL 98 103  CO2 25 27  GLUCOSE 130* 100*  BUN 18 12  CREATININE 0.73 0.61  CALCIUM 8.7* 8.1*   GFR: Estimated Creatinine Clearance: 102.7 mL/min (by C-G formula based on SCr of 0.61 mg/dL). Liver Function Tests: Recent Labs  Lab 09/26/17 0907 09/27/17 0614  AST 28 24  ALT 29 31  ALKPHOS 96 79  BILITOT 0.9 0.4  PROT 6.4* 5.6*  ALBUMIN 3.4* 2.8*   No results for input(s): LIPASE, AMYLASE in the last 168 hours. No results for input(s): AMMONIA in the last 168 hours. Coagulation Profile: No results for input(s): INR, PROTIME in the last 168 hours. Cardiac Enzymes: No results for input(s): CKTOTAL, CKMB, CKMBINDEX, TROPONINI in the last 168 hours. BNP (last 3 results) No results for input(s): PROBNP in the last 8760 hours. HbA1C: No results for input(s): HGBA1C in the last 72 hours. CBG: No results for input(s): GLUCAP in the last 168 hours. Lipid Profile: No results for input(s): CHOL, HDL, LDLCALC, TRIG, CHOLHDL, LDLDIRECT in the last 72 hours. Thyroid Function Tests: No results for input(s): TSH, T4TOTAL, FREET4, T3FREE, THYROIDAB in the last 72 hours. Anemia Panel: No results for input(s): VITAMINB12, FOLATE, FERRITIN, TIBC, IRON, RETICCTPCT in the last 72 hours. Sepsis Labs: Recent Labs  Lab 09/26/17 0907 09/26/17 1100 09/27/17 0614  PROCALCITON 0.71  --  8.32  LATICACIDVEN 3.1* 2.7* 1.4    Recent Results (from the past 240 hour(s))  Blood Culture (routine x 2)     Status: None (Preliminary result)   Collection Time: 09/26/17  9:07 AM    Result Value Ref Range Status   Specimen Description PORTA CATH DRAWN BY RN  Final   Special Requests   Final    BOTTLES DRAWN AEROBIC AND ANAEROBIC Blood Culture adequate volume   Culture   Final    NO GROWTH < 24 HOURS Performed at [facility], [address], [place], [place] [address]    Report Status PENDING  Incomplete  Blood Culture (routine x 2)     Status: None (Preliminary result)   Collection Time: 09/26/17  9:14 AM  Result Value Ref Range Status   Specimen Description BLOOD LEFT ARM  Final   Special Requests   Final    BOTTLES DRAWN AEROBIC AND ANAEROBIC Blood Culture adequate volume   Culture   Final    NO GROWTH < 24 HOURS Performed at [facility], [address] [place], [place] [address]    Report Status PENDING  Incomplete  Urine culture     Status: Abnormal   Collection Time: 09/26/17 10:10 AM  Result Value Ref Range Status  Specimen Description   Final    URINE, CLEAN CATCH Performed at [facility], [address], [place], [place] [address]    Special Requests   Final    NONE Performed at [facility], [address], [place], [place] [address]    Culture (A)  Final    <10,000 COLONIES/mL INSIGNIFICANT GROWTH Performed at [place] [address] [place],  [address]    Report Status 09/27/2017 FINAL  Final         Radiology Studies: Dg Chest Port 1 View  Result Date: 09/26/2017 CLINICAL DATA:  Fever and nausea. Cough. Ongoing chemotherapy for gastric cancer. EXAM: PORTABLE CHEST 1 VIEW COMPARISON:  08/06/2017 FINDINGS: A right subclavian Port-A-Cath terminates over the lower SVC. The cardiac silhouette is borderline enlarged. Aortic atherosclerosis is noted. The patient has taken a greater inspiration than on the prior study with improved aeration of the lung bases. No airspace consolidation, edema, sizable pleural effusion, or pneumothorax is identified. No acute osseous abnormality is seen. IMPRESSION: Improved basilar aeration  without evidence of active disease. Electronically Signed   By: Logan Bores M.D.   On: 09/26/2017 09:17        Scheduled Meds: . free water  200 mL Per Tube Q4H  . ondansetron (ZOFRAN) IV  4 mg Intravenous Once   Continuous Infusions: . feeding supplement (OSMOLITE 1.5 CAL)    . piperacillin-tazobactam (ZOSYN)  IV 3.375 g (09/27/17 1110)  . vancomycin Stopped (09/27/17 0507)     LOS: 0 days    Time spent: 30 minutes    Ashleyanne Hemmingway Darleen Crocker, DO [facility] Pager [phone]  If 7PM-7AM, please contact night-coverage [email or web address] Password [email or web address] 09/27/2017, 1:31 PM

## 2017-09-27 NOTE — Progress Notes (Signed)
Initial Nutrition Assessment  DOCUMENTATION CODES:  Not applicable  INTERVENTION:  Noted to CSW RN that patient having significant secretions related to radiation and would greatly benefit from home suction. Has this set up in room currently and is helping greatly.  While admitted, would benefit from a 24-hr feeding regimen.   Initiate Osmolite 1.5 @ 70 ml/hr via PEG and  Tube feeding regimen provides 2520 kcal, 105 grams of protein, and 1280 ml of H2O.   To meet fluid needs, +200 cc free water q4 hrs.   NUTRITION DIAGNOSIS:  Inadequate oral intake related to inability to eat, cancer and cancer related treatments as evidenced by Reliance on PEG for all nutrition.  GOAL:  Patient will meet greater than or equal to 90% of their needs  MONITOR:  PO intake, Weight trends, Labs, TF tolerance  REASON FOR ASSESSMENT:  Consult Enteral/tube feeding initiation and management, Assessment of nutrition requirement/status  ASSESSMENT:  62 y/o male PMHx HTN/HLD, OSA. Abruptly developed dysphagia to meats end of April, presenting to ED 4/30. EGD / biopsy performed. Biopsy + for adenocarcinoma w/ workup revealing limited disease. During workup, developed rapidly progressing dysphagia,requiring hospitalizationfor open J tube insertion. Started TF. Began chemoradiation as tentative bridge to surgery 7/9. Presented to hospital after Acutely developed fevers/chills. Met SIRS criteria and admitted for observation  Patient well know to RD as he is seen weekly at the cancer center for his tube feeding regimen.   Patients current home regimen is 7 cans Osmolite 1.5 via pump @ 110hr w/ flushes equaling 1200-1552m/d (300cc boluses q3-4 hr). Provides: 2485 kcal, 104.3g Pro, 1268mfree water (+1.2-1.5L from flushes).However, his pump at home is not calibrated correctly and in reality infuses at a rate closer to 120-130/hr.   Today, patient reports feeling relatively well. His problems with his tube  feeding are unchanged. He still is experiencing nausea, but is able to manage this fairly well with his zofran/compazine. This is thought to be related to secretions and higher TF rate. Patient is already on pump for >12 hrs and, for QOL reasons, prefers the nausea over being on pump longer.   Patient noted his epigastric pain from radiation is worsening. He says he builds up large amounts of secretions and needs to cough them up every 2 hrs as he gets nauseated from them. Currently, He is set up with a yankauer and says it is helping tremendously. Wife/pt would greatly benefit from home suction. Will discuss w/ CSW  There have been concerns over TF regimen insufficiency. Bed weight today is about the same as his last weight. Wife reports weight is stable vs only slightly down. After discussion, we will continue with current home TF regimen. RD stressed importance of weighing himself consistently, same time of day and without clothes on. They will be sure to notify RD of any further weight loss.   While inpatient, will run TF continuously, he will likely tolerate they this much better.   Labs: Albumin: 2.8- acute inflammatory response Meds: Zofran, IV abx  Recent Labs  Lab 09/26/17 0907 09/27/17 0614  NA 134* 135  K 4.1 4.0  CL 98 103  CO2 25 27  BUN 18 12  CREATININE 0.73 0.61  CALCIUM 8.7* 8.1*  GLUCOSE 130* 100*    NUTRITION - FOCUSED PHYSICAL EXAM:   Most Recent Value  Orbital Region  No depletion  Upper Arm Region  No depletion  Thoracic and Lumbar Region  No depletion  Buccal Region  No depletion  Temple Region  No depletion  Clavicle Bone Region  No depletion  Clavicle and Acromion Bone Region  No depletion  Scapular Bone Region  No depletion  Dorsal Hand  No depletion  Patellar Region  No depletion  Anterior Thigh Region  No depletion  Posterior Calf Region  No depletion  Edema (RD Assessment)  None     Diet Order:   Diet Order           Diet NPO time specified  Except for: Sips with Meds  Diet effective now         EDUCATION NEEDS:  No education needs have been identified at this time  Skin:  Skin Assessment: Reviewed RN Assessment  Last BM:  8/6  Height:  Ht Readings from Last 1 Encounters:  09/26/17 _0  (1.778 m)   Weight:  Wt Readings from Last 1 Encounters:  09/26/17 169 lb (76.7 kg)   Wt Readings from Last 10 Encounters:  09/26/17 169 lb (76.7 kg)  09/20/17 169 lb 9.6 oz (76.9 kg)  09/13/17 171 lb 9.6 oz (77.8 kg)  09/07/17 174 lb (78.9 kg)  09/06/17 174 lb 3.2 oz (79 kg)  09/01/17 178 lb (80.7 kg)  08/30/17 174 lb 4.8 oz (79.1 kg)  08/19/17 179 lb (81.2 kg)  08/19/17 179 lb (81.2 kg)  08/10/17 186 lb 4.6 oz (84.5 kg)   Ideal Body Weight:  75.45 kg  BMI:  Body mass index is 24.25 kg/m.  Estimated Nutritional Needs:  Kcal:  2550-2750 (33-36 kcal/kg bw) Protein:  108-123g Pro (1.4-1.6 g/kg bw) Fluid:   >2.4L/d (1 ml/kcal)  Burtis Junes RD, LDN, CNSC Clinical Nutrition Available Tues-Sat via Pager: [phone] 09/27/2017 10:54 AM

## 2017-09-27 NOTE — Consult Note (Signed)
[facility] Consultation Oncology  Name: Phillip Lane      MRN: [id number]    Location: N027/O536-64  Date: 09/27/2017 Time:5:24 PM   REFERRING PHYSICIAN: Dr. Manuella Ghazi  REASON FOR CONSULT: Neutropenic fever   DIAGNOSIS: GE junction adenocarcinoma.  HISTORY OF PRESENT ILLNESS: Phillip Lane is seen in consultation today for management of neutropenic fever.  He has been receiving chemoradiation therapy for GE junction adenocarcinoma, week #4 on 09/20/2017.  Yesterday morning he had felt chills and rigors.  His wife noted that his temperature has gone up to 102.  He was brought to the emergency room.  After blood cultures and urine cultures were done, he was started on IV antibiotics.  He is currently receiving vancomycin and Zosyn.  He does not report any fevers since he was admitted.  His white count was low at 1.1 with ANC of 1000 on presentation.  Today's white count is 1.4.  Platelet count has dropped to 70.  Denies any cough or expectoration.  Denies any sore throat.  Denies any burning on micturition.  No nausea, vomiting, diarrhea or constipation was reported.  Denies any headaches or vision changes.  PAST MEDICAL HISTORY:   Past Medical History:  Diagnosis Date  . Cancer ([facility] [place])    stomach  . Hyperlipidemia   . Hypertension   . Sleep apnea, obstructive   . Thoracic aortic aneurysm ([facility])     ALLERGIES: Allergies  Allergen Reactions  . Adhesive [Tape] Other (See Comments)    Paper tape only      MEDICATIONS: I have reviewed the patient's current medications.     PAST SURGICAL HISTORY Past Surgical History:  Procedure Laterality Date  . colonoscopy     [place] 49 polyps removed, age 14: ? POLYPS, AGE 13: ? POLYPS  . ESOPHAGEAL DILATION N/A 06/21/2017   Procedure: ESOPHAGEAL DILATION;  Surgeon: Danie Binder, MD;  [facility];  Service: Endoscopy;  Laterality: N/A;  . ESOPHAGOGASTRODUODENOSCOPY N/A 06/21/2017   Procedure: ESOPHAGOGASTRODUODENOSCOPY (EGD);   Surgeon: Danie Binder, MD;  Location: [facility];  Service: Endoscopy;  Laterality: N/A;  . EUS N/A 07/28/2017   Procedure: UPPER ENDOSCOPIC ULTRASOUND (EUS) RADIAL;  Surgeon: [name], MD;  Location: [facility] END[facility];  Service: Endoscopy;  Laterality: N/A;  . EYE SURGERY Bilateral    cataract  . JEJUNOSTOMY N/A 08/03/2017   Procedure: OPEN JEJUNOSTOMY TUBE PLACEMENT;  Surgeon: Virl Cagey, MD;  Location: [facility] ORS;  Service: General;  Laterality: N/A;  . PORTACATH PLACEMENT N/A 08/03/2017   Procedure: INSERTION PORT-A-CATH;  Surgeon: Virl Cagey, MD;  Location: [facility] ORS;  Service: General;  Laterality: N/A;    FAMILY HISTORY: Family History  Problem Relation Age of Onset  . Hypertension Mother   . Hypertension Father   . Diabetes Father   . Colon cancer Neg Hx   . Colon polyps Neg Hx     SOCIAL HISTORY:  reports that he has been smoking cigarettes.  He has been smoking about 1.00 pack per day. He has never used smokeless tobacco. He reports that he drank alcohol. He reports that he does not use drugs.  PERFORMANCE STATUS: The patient's performance status is 2 - Symptomatic, <50% confined to bed  PHYSICAL EXAM: Most Recent Vital Signs: Blood pressure 118/78, pulse 98, temperature 98.6 F (37 C), temperature source Oral, resp. rate 18, height '5\' 10"'  (1.778 m), weight 169 lb (76.7 kg), SpO2 97 %. General appearance: alert and cooperative Eyes:  conjunctivae/corneas clear. PERRL, EOM's intact. Fundi benign. Throat: lips, mucosa, and tongue normal; teeth and gums normal Neck: thyroid not enlarged, symmetric, no tenderness/mass/nodules Lungs: clear to auscultation bilaterally Heart: regular rate and rhythm Abdomen: soft, non-tender; bowel sounds normal; no masses,  no organomegaly Extremities: extremities normal, atraumatic, no cyanosis or edema Skin: Skin color, texture, turgor normal. No rashes or lesions  LABORATORY DATA:  Results for orders placed or performed  during the hospital encounter of 09/26/17 (from the past 48 hour(s))  Comprehensive metabolic panel     Status: Abnormal   Collection Time: 09/26/17  9:07 AM  Result Value Ref Range   Sodium 134 (L) 135 - 145 mmol/L   Potassium 4.1 3.5 - 5.1 mmol/L   Chloride 98 98 - 111 mmol/L   CO2 25 22 - 32 mmol/L   Glucose, Bld 130 (H) 70 - 99 mg/dL   BUN 18 6 - 20 mg/dL   Creatinine, Ser 0.73 0.61 - 1.24 mg/dL   Calcium 8.7 (L) 8.9 - 10.3 mg/dL   Total Protein 6.4 (L) 6.5 - 8.1 g/dL   Albumin 3.4 (L) 3.5 - 5.0 g/dL   AST 28 15 - 41 U/L   ALT 29 0 - 44 U/L   Alkaline Phosphatase 96 38 - 126 U/L   Total Bilirubin 0.9 0.3 - 1.2 mg/dL   GFR calc non Af Amer >60 >60 mL/min   GFR calc Af Amer >60 >60 mL/min    Comment: (NOTE) The eGFR has been calculated using the CKD EPI equation. This calculation has not been validated in all clinical situations. eGFR's persistently <60 mL/min signify possible Chronic Kidney Disease.    Anion gap 11 5 - 15    Comment: Performed at [facility], [address] [place], [place] [address]  CBC WITH DIFFERENTIAL     Status: Abnormal   Collection Time: 09/26/17  9:07 AM  Result Value Ref Range   WBC 1.1 (LL) 4.0 - 10.5 K/uL    Comment: REPEATED TO VERIFY CRITICAL RESULT CALLED TO, READ BACK BY AND VERIFIED WITH: PATRAW,B '@0952'  BY MATTHEWS,B 8.5.19    RBC 4.07 (L) 4.22 - 5.81 MIL/uL   Hemoglobin 12.3 (L) 13.0 - 17.0 g/dL   HCT 37.1 (L) 39.0 - 52.0 %   MCV 91.2 78.0 - 100.0 fL   MCH 30.2 26.0 - 34.0 pg   MCHC 33.2 30.0 - 36.0 g/dL   RDW 14.2 11.5 - 15.5 %   Platelets 80 (L) 150 - 400 K/uL    Comment: SPECIMEN CHECKED FOR CLOTS PLATELET COUNT CONFIRMED BY SMEAR    Neutrophils Relative % 90 %   Lymphocytes Relative 6 %   Monocytes Relative 4 %   Eosinophils Relative 0 %   Basophils Relative 0 %   Neutro Abs 1.0 (L) 1.7 - 7.7 K/uL   Lymphs Abs 0.1 (L) 0.7 - 4.0 K/uL   Monocytes Absolute 0.0 (L) 0.1 - 1.0 K/uL   Eosinophils Absolute 0.0 0.0 - 0.7 K/uL    Basophils Absolute 0.0 0.0 - 0.1 K/uL   WBC Morphology MILD LEFT SHIFT (1-5% METAS, OCC MYELO, OCC BANDS)     Comment: Performed at [facility], [address] [place], [place] [address]  Blood Culture (routine x 2)     Status: None (Preliminary result)   Collection Time: 09/26/17  9:07 AM  Result Value Ref Range   Specimen Description PORTA CATH DRAWN BY RN    Special Requests      BOTTLES  DRAWN AEROBIC AND ANAEROBIC Blood Culture adequate volume   Culture      NO GROWTH < 24 HOURS Performed at [facility], [address], [place], [place] [address]    Report Status PENDING   Lactic acid, plasma     Status: Abnormal   Collection Time: 09/26/17  9:07 AM  Result Value Ref Range   Lactic Acid, Venous 3.1 (HH) 0.5 - 1.9 mmol/L    Comment: CRITICAL RESULT CALLED TO, READ BACK BY AND VERIFIED WITH: EDWARDS,C AT 9:40AM ON 09/26/17 BY [facility] Performed at [facility], [address], [place], [place] [address]   HIV antibody (Routine Testing)     Status: None   Collection Time: 09/26/17  9:07 AM  Result Value Ref Range   HIV Screen 4th Generation wRfx Non Reactive Non Reactive    Comment: (NOTE) Performed At: [facility] [place], [place] [id number] Rush Farmer MD JG:[id number]   Procalcitonin - Baseline     Status: None   Collection Time: 09/26/17  9:07 AM  Result Value Ref Range   Procalcitonin 0.71 ng/mL    Comment:        Interpretation: PCT > 0.5 ng/mL and <= 2 ng/mL: Systemic infection (sepsis) is possible, but other conditions are known to elevate PCT as well. (NOTE)       Sepsis PCT Algorithm           Lower Respiratory Tract                                      Infection PCT Algorithm    ----------------------------     ----------------------------         PCT < 0.25 ng/mL                PCT < 0.10 ng/mL         Strongly encourage             Strongly discourage   discontinuation of antibiotics    initiation of antibiotics     ----------------------------     -----------------------------       PCT 0.25 - 0.50 ng/mL            PCT 0.10 - 0.25 ng/mL               OR       >80% decrease in PCT            Discourage initiation of                                            antibiotics      Encourage discontinuation           of antibiotics    ----------------------------     -----------------------------         PCT >= 0.50 ng/mL              PCT 0.26 - 0.50 ng/mL                AND       <80% decrease in PCT             Encourage initiation of  antibiotics       Encourage continuation           of antibiotics    ----------------------------     -----------------------------        PCT >= 0.50 ng/mL                  PCT > 0.50 ng/mL               AND         increase in PCT                  Strongly encourage                                      initiation of antibiotics    Strongly encourage escalation           of antibiotics                                     -----------------------------                                           PCT <= 0.25 ng/mL                                                 OR                                        > 80% decrease in PCT                                     Discontinue / Do not initiate                                             antibiotics Performed at [facility], [address] [place], [place] [address]   Blood Culture (routine x 2)     Status: None (Preliminary result)   Collection Time: 09/26/17  9:14 AM  Result Value Ref Range   Specimen Description BLOOD LEFT ARM    Special Requests      BOTTLES DRAWN AEROBIC AND ANAEROBIC Blood Culture adequate volume   Culture      NO GROWTH < 24 HOURS Performed at [facility], [address], [place], [place] [address]    Report Status PENDING   Urinalysis, Routine w reflex microscopic (not at [facility])     Status: Abnormal   Collection Time: 09/26/17 10:10 AM  Result Value  Ref Range   Color, Urine AMBER (A) YELLOW    Comment: BIOCHEMICALS MAY BE AFFECTED BY COLOR   APPearance HAZY (A) CLEAR   Specific Gravity, Urine 1.025 1.005 - 1.030   pH 6.0 5.0 - 8.0   Glucose, UA NEGATIVE NEGATIVE mg/dL   Hgb  urine dipstick NEGATIVE NEGATIVE   Bilirubin Urine NEGATIVE NEGATIVE   Ketones, ur NEGATIVE NEGATIVE mg/dL   Protein, ur NEGATIVE NEGATIVE mg/dL   Nitrite NEGATIVE NEGATIVE   Leukocytes, UA NEGATIVE NEGATIVE    Comment: Performed at [facility], [address] [place], [place] [address]  Urine culture     Status: Abnormal   Collection Time: 09/26/17 10:10 AM  Result Value Ref Range   Specimen Description      URINE, CLEAN CATCH Performed at [facility], [address], [place], [place] [address]    Special Requests      NONE Performed at [facility], [address], [place], [place] [address]    Culture (A)     <10,000 COLONIES/mL INSIGNIFICANT GROWTH Performed at [facility] [address] [place], [place] [address]    Report Status 09/27/2017 FINAL   Lactic acid, plasma     Status: Abnormal   Collection Time: 09/26/17 11:00 AM  Result Value Ref Range   Lactic Acid, Venous 2.7 (HH) 0.5 - 1.9 mmol/L    Comment: CRITICAL RESULT CALLED TO, READ BACK BY AND VERIFIED WITH: SHORE,L AT 11:25AM ON 09/26/17 BY [facility] Performed at [facility] [address] [place], [place] [address]   CBC     Status: Abnormal   Collection Time: 09/27/17  6:14 AM  Result Value Ref Range   WBC 1.5 (L) 4.0 - 10.5 K/uL   RBC 3.65 (L) 4.22 - 5.81 MIL/uL   Hemoglobin 11.0 (L) 13.0 - 17.0 g/dL   HCT 33.2 (L) 39.0 - 52.0 %   MCV 91.0 78.0 - 100.0 fL   MCH 30.1 26.0 - 34.0 pg   MCHC 33.1 30.0 - 36.0 g/dL   RDW 14.4 11.5 - 15.5 %   Platelets 71 (L) 150 - 400 K/uL    Comment: SPECIMEN CHECKED FOR CLOTS CONSISTENT WITH PREVIOUS RESULT Performed at [facility], [address] [place], [place] [address]   Comprehensive metabolic panel     Status: Abnormal    Collection Time: 09/27/17  6:14 AM  Result Value Ref Range   Sodium 135 135 - 145 mmol/L   Potassium 4.0 3.5 - 5.1 mmol/L   Chloride 103 98 - 111 mmol/L   CO2 27 22 - 32 mmol/L   Glucose, Bld 100 (H) 70 - 99 mg/dL   BUN 12 6 - 20 mg/dL   Creatinine, Ser 0.61 0.61 - 1.24 mg/dL   Calcium 8.1 (L) 8.9 - 10.3 mg/dL   Total Protein 5.6 (L) 6.5 - 8.1 g/dL   Albumin 2.8 (L) 3.5 - 5.0 g/dL   AST 24 15 - 41 U/L   ALT 31 0 - 44 U/L   Alkaline Phosphatase 79 38 - 126 U/L   Total Bilirubin 0.4 0.3 - 1.2 mg/dL   GFR calc non Af Amer >60 >60 mL/min   GFR calc Af Amer >60 >60 mL/min    Comment: (NOTE) The eGFR has been calculated using the CKD EPI equation. This calculation has not been validated in all clinical situations. eGFR's persistently <60 mL/min signify possible Chronic Kidney Disease.    Anion gap 5 5 - 15    Comment: Performed at [facility], [address] [place], [place] [address]  Lactic acid, plasma     Status: None   Collection Time: 09/27/17  6:14 AM  Result Value Ref Range   Lactic Acid, Venous 1.4 0.5 - 1.9 mmol/L    Comment: Performed at [employer]  [facility], [address] [place], [place] [address]  Procalcitonin     Status: None   Collection Time: 09/27/17  6:14 AM  Result Value Ref Range   Procalcitonin 8.32 ng/mL    Comment:        Interpretation: PCT > 2 ng/mL: Systemic infection (sepsis) is likely, unless other causes are known. (NOTE)       Sepsis PCT Algorithm           Lower Respiratory Tract                                      Infection PCT Algorithm    ----------------------------     ----------------------------         PCT < 0.25 ng/mL                PCT < 0.10 ng/mL         Strongly encourage             Strongly discourage   discontinuation of antibiotics    initiation of antibiotics    ----------------------------     -----------------------------       PCT 0.25 - 0.50 ng/mL            PCT 0.10 - 0.25 ng/mL               OR       >80% decrease in PCT             Discourage initiation of                                            antibiotics      Encourage discontinuation           of antibiotics    ----------------------------     -----------------------------         PCT >= 0.50 ng/mL              PCT 0.26 - 0.50 ng/mL               AND       <80% decrease in PCT              Encourage initiation of                                             antibiotics       Encourage continuation           of antibiotics    ----------------------------     -----------------------------        PCT >= 0.50 ng/mL                  PCT > 0.50 ng/mL               AND         increase in PCT                  Strongly encourage  initiation of antibiotics    Strongly encourage escalation           of antibiotics                                     -----------------------------                                           PCT <= 0.25 ng/mL                                                 OR                                        > 80% decrease in PCT                                     Discontinue / Do not initiate                                             antibiotics Performed at [facility], [address], [place], [place] [address]   Glucose, capillary     Status: None   Collection Time: 09/27/17  1:58 PM  Result Value Ref Range   Glucose-Capillary 91 70 - 99 mg/dL  Glucose, capillary     Status: None   Collection Time: 09/27/17  5:00 PM  Result Value Ref Range   Glucose-Capillary 85 70 - 99 mg/dL      RADIOGRAPHY: Dg Chest Port 1 View  Result Date: 09/26/2017 CLINICAL DATA:  Fever and nausea. Cough. Ongoing chemotherapy for gastric cancer. EXAM: PORTABLE CHEST 1 VIEW COMPARISON:  08/06/2017 FINDINGS: A right subclavian Port-A-Cath terminates over the lower SVC. The cardiac silhouette is borderline enlarged. Aortic atherosclerosis is noted. The patient has taken a greater inspiration than on the prior study  with improved aeration of the lung bases. No airspace consolidation, edema, sizable pleural effusion, or pneumothorax is identified. No acute osseous abnormality is seen. IMPRESSION: Improved basilar aeration without evidence of active disease. Electronically Signed   By: Logan Bores M.D.   On: 09/26/2017 09:17        ASSESSMENT AND PLAN:  1. Neutropenic fever: - He had a T-max of 105.8 on presentation to the ER.  Blood cultures did not show any growth to date.  No focus of infection was isolated.  He is continuing to be on vancomycin and Zosyn.  I would recommend Neupogen injection x1. -If he remains afebrile for 48 hours, may consider switching him to Augmentin liquid twice daily at 875 mg dose. -he has an appointment to see me in the office next week.  If needed we can check his blood counts later this week.  2.  GE junction adenocarcinoma: - He is receiving chemoradiation therapy weekly with carboplatin and paclitaxel.  Week 4 of treatment was given on 09/20/2017.  Radiation therapy has been on hold for the last couple of days. -We will resume treatment with chemo next week.  He will be evaluated by Dr.Yanagihara at [facility] for further radiation.  Derek Jack

## 2017-09-27 NOTE — Progress Notes (Signed)
Patient placed on enteric isolation per infection control policy. Patient educated and given care notes on enteric isolation patient and family verbalized understanding. Will continue to monitor patient.

## 2017-09-28 ENCOUNTER — Encounter (HOSPITAL_COMMUNITY): Payer: Self-pay | Admitting: *Deleted

## 2017-09-28 DIAGNOSIS — R6510 Systemic inflammatory response syndrome (SIRS) of non-infectious origin without acute organ dysfunction: Secondary | ICD-10-CM | POA: Diagnosis not present

## 2017-09-28 DIAGNOSIS — E43 Unspecified severe protein-calorie malnutrition: Secondary | ICD-10-CM

## 2017-09-28 DIAGNOSIS — I10 Essential (primary) hypertension: Secondary | ICD-10-CM | POA: Diagnosis not present

## 2017-09-28 DIAGNOSIS — C169 Malignant neoplasm of stomach, unspecified: Secondary | ICD-10-CM

## 2017-09-28 LAB — CBC WITH DIFFERENTIAL/PLATELET
BASOS ABS: 0 10*3/uL (ref 0.0–0.1)
Basophils Relative: 0 %
EOS PCT: 0 %
Eosinophils Absolute: 0 10*3/uL (ref 0.0–0.7)
HCT: 31.6 % — ABNORMAL LOW (ref 39.0–52.0)
Hemoglobin: 10.5 g/dL — ABNORMAL LOW (ref 13.0–17.0)
Lymphocytes Relative: 15 %
Lymphs Abs: 0.4 10*3/uL — ABNORMAL LOW (ref 0.7–4.0)
MCH: 30.2 pg (ref 26.0–34.0)
MCHC: 33.2 g/dL (ref 30.0–36.0)
MCV: 90.8 fL (ref 78.0–100.0)
MONO ABS: 0.1 10*3/uL (ref 0.1–1.0)
Monocytes Relative: 4 %
NEUTROS PCT: 81 %
Neutro Abs: 2 10*3/uL (ref 1.7–7.7)
PLATELETS: 72 10*3/uL — AB (ref 150–400)
RBC: 3.48 MIL/uL — ABNORMAL LOW (ref 4.22–5.81)
RDW: 14.6 % (ref 11.5–15.5)
WBC: 2.5 10*3/uL — ABNORMAL LOW (ref 4.0–10.5)

## 2017-09-28 LAB — GLUCOSE, CAPILLARY
GLUCOSE-CAPILLARY: 137 mg/dL — AB (ref 70–99)
GLUCOSE-CAPILLARY: 132 mg/dL — AB (ref 70–99)
GLUCOSE-CAPILLARY: 111 mg/dL — AB (ref 70–99)
Glucose-Capillary: 143 mg/dL — ABNORMAL HIGH (ref 70–99)
Glucose-Capillary: 145 mg/dL — ABNORMAL HIGH (ref 70–99)

## 2017-09-28 LAB — BASIC METABOLIC PANEL
Anion gap: 8 (ref 5–15)
BUN: 11 mg/dL (ref 6–20)
CO2: 27 mmol/L (ref 22–32)
Calcium: 8 mg/dL — ABNORMAL LOW (ref 8.9–10.3)
Chloride: 97 mmol/L — ABNORMAL LOW (ref 98–111)
Creatinine, Ser: 0.59 mg/dL — ABNORMAL LOW (ref 0.61–1.24)
GFR calc Af Amer: >60 mL/min (ref >60)
GLUCOSE: 241 mg/dL — AB (ref 70–99)
POTASSIUM: 3.3 mmol/L — AB (ref 3.5–5.1)
SODIUM: 132 mmol/L — AB (ref 135–145)

## 2017-09-28 MED ORDER — FREE WATER: 200.0000 mL | Freq: Three times a day (TID) | Status: AC

## 2017-09-28 MED ORDER — AMOXICILLIN-POT CLAVULANATE 875-125 MG PO TABS: 1.0000 | ORAL_TABLET | Freq: Two times a day (BID) | ORAL | Status: DC

## 2017-09-28 MED ORDER — AMOXICILLIN-POT CLAVULANATE 600-42.9 MG/5ML PO SUSR
875.0000 mg | Freq: Two times a day (BID) | ORAL | Status: DC
  Filled 2017-09-28 (×7): qty 7.3

## 2017-09-28 MED ORDER — AMOXICILLIN-POT CLAVULANATE 875-125 MG PO TABS
1.0000 | ORAL_TABLET | Freq: Two times a day (BID) | ORAL | Status: DC
  Administered 2017-09-28: 1 via ORAL
  Filled 2017-09-28: qty 1

## 2017-09-28 MED ORDER — AMOXICILLIN-POT CLAVULANATE 250-62.5 MG/5ML PO SUSR: 875.0000 mg | Freq: Two times a day (BID) | ORAL | Status: DC

## 2017-09-28 MED ORDER — AMOXICILLIN-POT CLAVULANATE 250-62.5 MG/5ML PO SUSR: 500.0000 mg | Freq: Two times a day (BID) | ORAL | 0 refills | Status: AC

## 2017-09-28 MED ORDER — HEPARIN SOD (PORK) LOCK FLUSH 100 UNIT/ML IV SOLN
500.0000 [IU] | INTRAVENOUS | Status: AC | PRN
  Administered 2017-09-28: 500 [IU]
  Filled 2017-09-28: qty 5

## 2017-09-28 MED ORDER — DOCUSATE SODIUM 50 MG/5ML PO LIQD: 100.0000 mg | Freq: Two times a day (BID) | ORAL | Status: AC | PRN

## 2017-09-28 MED FILL — Loperamide HCl Susp 1 MG/7.5ML: ORAL | Qty: 7.5 | Status: AC

## 2017-09-28 NOTE — Progress Notes (Signed)
Discharge instructions given on medications and follow up visits,patient verbalized understanding. Prescription sent with patient. Accompanied by staff to an awaiting vehicle.

## 2017-09-28 NOTE — Progress Notes (Signed)
Per Dr. Delton Coombes, patient to resume radiation tomorrow and will postpone chemotherapy until next week.  Patient is aware of appointment times.

## 2017-09-28 NOTE — Discharge Summary (Signed)
Physician Discharge Summary  Phillip Lane NID:[id number] DOB: 21-May-1955 DOA: 09/26/2017  PCP: Curlene Labrum, MD  Admit date: 09/26/2017 Discharge date: 09/28/2017  Time spent: 35 minutes  Recommendations for Outpatient Follow-up:  1. Repeat basic metabolic panel to follow electrolytes and renal function   Discharge Diagnoses:  Principal Problem:   SIRS (systemic inflammatory response syndrome) (HCC) Active Problems:   Gastric cancer (HCC)   Protein-calorie malnutrition, severe   Essential hypertension   Hyperlipidemia   Neutropenic Fever   Discharge Condition: Stable and improved.  Patient discharged home with instruction to follow-up with PCP and oncology service as an outpatient.  Diet recommendation: Continue tube feedings and free water.  Filed Weights   09/26/17 0815 09/28/17 0610  Weight: 76.7 kg (169 lb) 76.9 kg (169 lb 9.6 oz)    History of present illness:  As per H&P written by Dr. Manuella Ghazi on 09/27/17. 62 y.o.malewith medical history significant forgastric cancer currently on chemo and radiation, dyslipidemia, hypertension, and prior tobacco abuse who presented to the emergency department with sudden onset fevers, chills, and rigors that began at approximately 6:30 AM while sitting outside on his porch. He apparently had his last chemotherapy treatment on Tuesday of last week.  He has been admitted with fever and positive sirs criteria with no obvious sign of infection noted yet.  His procalcitonin was elevated and his blood cultures have demonstrated no growth in less than 24 hours.  His fever has resolved for approximately 24 hours at this point.  He continues to have symptoms of ongoing nausea and high level of oral secretions which is not unusual for him.  Oncology evaluation still pending.  Hospital Course:  1-SIRS criteria and neutropenic fever. -Patient kept on broad spectrum antibiotics until 24 hours w/o fever. -blood cx's and urine cx has remained negative   -CXR w/o signs of new infiltrates  -Case was discussed with oncology and decision made to treat for 7 more days using Augmentin. -patient advise to keep himself hydrated. -Neupogen given on 09/27/17 -continue outpatient follow up with oncology service  2-gastric cancer: actively on chemotherapy and radiation therapy -chemotherapy would be hold until next week -ok to resume radiation on 09/29/17 -continue outpatient follow up with oncology service   3-hypokalemia -repleted -repeat BMET at follow up to reassess electrolytes trend   4-GERD -continue famotidine   5-dyslipidemia -not on statin anymore  6-HTN -stable and well controlled -follow VS and start antihypertensive regimen if needed -not on any medications currently  7-protein calorie malnutrition : Severe -continue tube feeding     Procedures:  See below for x-ray reports   Consultations:  Oncology   Discharge Exam: Vitals:   09/28/17 0610 09/28/17 1325  BP: 120/74 132/90  Pulse: (!) 101 87  Resp: 18 18  Temp: 98.4 F (36.9 C) 99.5 F (37.5 C)  SpO2: 97% 100%    General: Frail, chronically ill in appearance, in no acute distress.  Afebrile now for 36 hours; no nausea, no vomiting, no abdominal pain. Cardiovascular: S1 and S2, no rubs, no gallops, no JVD. Respiratory: CTA bilaterally. Abd: soft, NT, ND, positive BS. Extremities: no edema, no cyanosis, no clubbing   Discharge Instructions   Discharge Instructions    Discharge instructions   Complete by:  As directed    Use probiotic suspension from [employer] (preferable flavor) twice a day for 10 days. Take medications as prescribed Please keep yourself well-hydrated Arrange follow-up with PCP in 10 days. Follow-up with oncology service as previously  scheduled. 7 more days of antibiotics.     Allergies as of 09/28/2017      Reactions   Adhesive [tape] Other (See Comments)   Paper tape only      Medication List    STOP taking these medications    bisacodyl 10 MG suppository Commonly known as:  DULCOLAX     TAKE these medications   acetaminophen 160 MG/5ML liquid Commonly known as:  TYLENOL Take by mouth every 4 (four) hours as needed for fever.   amoxicillin-clavulanate 250-62.5 MG/5ML suspension Commonly known as:  AUGMENTIN Take 10 mLs (500 mg total) by mouth 2 (two) times daily for 7 days.   cyclobenzaprine 5 MG tablet Commonly known as:  FLEXERIL Take 1 tablet (5 mg total) by mouth 3 (three) times daily as needed for muscle spasms.   docusate 50 MG/5ML liquid Commonly known as:  COLACE Place 10 mLs (100 mg total) into feeding tube 2 (two) times daily as needed for mild constipation. What changed:    when to take this  reasons to take this   feeding supplement (OSMOLITE 1.5 CAL) Liqd Give osmolite 1.5 at 116ml/hr for 12 hours via continuous pump through J-tube per day.  Start at rate of 138ml/hr for 12 hours and increase by 35ml daily until goal rate of 133ml reached.   free water Soln Place 200 mLs into feeding tube every 8 (eight) hours.   IMODIUM A-D 1 MG/7.5ML Liqd Generic drug:  Loperamide HCl Take by mouth.   lidocaine-prilocaine cream Commonly known as:  EMLA Apply to affected area once   ondansetron 4 MG disintegrating tablet Commonly known as:  ZOFRAN-ODT Take 1 tablet (4 mg total) by mouth every 6 (six) hours as needed for nausea or vomiting.   prochlorperazine 10 MG tablet Commonly known as:  COMPAZINE Take 1 tablet (10 mg total) by mouth every 6 (six) hours as needed for nausea or vomiting.   sucralfate 1 GM/10ML suspension Commonly known as:  CARAFATE Take 10 mLs (1 g total) by mouth 4 (four) times daily -  with meals and at bedtime.   Zolpidem Tartrate 5 MG/ACT Soln Take 1 Act (5 mg total) by mouth at bedtime.            Durable Medical Equipment  (From admission, onward)        Start     Ordered   09/27/17 1057  For home use only DME Suction  Once    Question:  Suction   Answer:  Oral   09/27/17 1057     Allergies  Allergen Reactions  . Adhesive [Tape] Other (See Comments)    Paper tape only   Follow-up Information    Burdine, Virgina Evener, MD. Schedule an appointment as soon as possible for a visit in 10 day(s).   Specialty:  Family Medicine Contact information: [place] [address] [phone]           The results of significant diagnostics from this hospitalization (including imaging, microbiology, ancillary and laboratory) are listed below for reference.    Significant Diagnostic Studies: Dg Abdomen Peg Tube Location  Result Date: 09/07/2017 CLINICAL DATA:  Jejunostomy tube malfunction. EXAM: ABDOMEN - 1 VIEW COMPARISON:  None. FINDINGS: Single image shows contrast injected through the jejunostomy tube, within normal pattern. No extravasation or evident obstruction. IMPRESSION: Jejunostomy tube check has a good appearance. Electronically Signed   By: Nelson Chimes M.D.   On: 09/07/2017 09:05   Dg Chest [facility]  1 View  Result Date: 09/26/2017 CLINICAL DATA:  Fever and nausea. Cough. Ongoing chemotherapy for gastric cancer. EXAM: PORTABLE CHEST 1 VIEW COMPARISON:  08/06/2017 FINDINGS: A right subclavian Port-A-Cath terminates over the lower SVC. The cardiac silhouette is borderline enlarged. Aortic atherosclerosis is noted. The patient has taken a greater inspiration than on the prior study with improved aeration of the lung bases. No airspace consolidation, edema, sizable pleural effusion, or pneumothorax is identified. No acute osseous abnormality is seen. IMPRESSION: Improved basilar aeration without evidence of active disease. Electronically Signed   By: Logan Bores M.D.   On: 09/26/2017 09:17   Microbiology: Recent Results (from the past 240 hour(s))  Blood Culture (routine x 2)     Status: None (Preliminary result)   Collection Time: 09/26/17  9:07 AM  Result Value Ref Range Status   Specimen Description PORTA CATH DRAWN BY RN  Final    Special Requests   Final    BOTTLES DRAWN AEROBIC AND ANAEROBIC Blood Culture adequate volume   Culture   Final    NO GROWTH 2 DAYS Performed at [facility], [address], [place], [place] [address]    Report Status PENDING  Incomplete  Blood Culture (routine x 2)     Status: None (Preliminary result)   Collection Time: 09/26/17  9:14 AM  Result Value Ref Range Status   Specimen Description BLOOD LEFT ARM  Final   Special Requests   Final    BOTTLES DRAWN AEROBIC AND ANAEROBIC Blood Culture adequate volume   Culture   Final    NO GROWTH 2 DAYS Performed at [facility], [address], [place], [place] [address]    Report Status PENDING  Incomplete  Urine culture     Status: Abnormal   Collection Time: 09/26/17 10:10 AM  Result Value Ref Range Status   Specimen Description   Final    URINE, CLEAN CATCH Performed at [facility], [address], [place], [place] [address]    Special Requests   Final    NONE Performed at [facility], [address] [place],  [address]    Culture (A)  Final    <10,000 COLONIES/mL INSIGNIFICANT GROWTH Performed at [facility], [place] [address] [place],  [address]    Report Status 09/27/2017 FINAL  Final     Labs: Basic Metabolic Panel: Recent Labs  Lab 09/26/17 0907 09/27/17 0614 09/28/17 0612  NA 134* 135 132*  K 4.1 4.0 3.3*  CL 98 103 97*  CO2 25 27 27   GLUCOSE 130* 100* 241*  BUN 18 12 11   CREATININE 0.73 0.61 0.59*  CALCIUM 8.7* 8.1* 8.0*   Liver Function Tests: Recent Labs  Lab 09/26/17 0907 09/27/17 0614  AST 28 24  ALT 29 31  ALKPHOS 96 79  BILITOT 0.9 0.4  PROT 6.4* 5.6*  ALBUMIN 3.4* 2.8*   CBC: Recent Labs  Lab 09/26/17 0907 09/27/17 0614 09/28/17 0612  WBC 1.1* 1.5* 2.5*  NEUTROABS 1.0*  --  2.0  HGB 12.3* 11.0* 10.5*  HCT 37.1* 33.2* 31.6*  MCV 91.2 91.0 90.8  PLT 80* 71* 72*   CBG: Recent Labs  Lab 09/28/17 0006 09/28/17 0404 09/28/17 0816 09/28/17 1115 09/28/17 1631   GLUCAP 145* 137* 132* 143* 111*    Signed:  Barton Dubois MD.  [facility] 09/28/2017, 5:32 PM

## 2017-09-29 ENCOUNTER — Ambulatory Visit: Payer: BLUE CROSS/BLUE SHIELD | Admitting: Cardiothoracic Surgery

## 2017-10-01 LAB — CULTURE, BLOOD (ROUTINE X 2)
Culture: NO GROWTH
Culture: NO GROWTH
SPECIAL REQUESTS: ADEQUATE
Special Requests: ADEQUATE

## 2017-10-03 ENCOUNTER — Inpatient Hospital Stay (HOSPITAL_COMMUNITY): Payer: BLUE CROSS/BLUE SHIELD | Attending: Hematology

## 2017-10-03 DIAGNOSIS — C160 Malignant neoplasm of cardia: Secondary | ICD-10-CM | POA: Insufficient documentation

## 2017-10-03 DIAGNOSIS — Y842 Radiological procedure and radiotherapy as the cause of abnormal reaction of the patient, or of later complication, without mention of misadventure at the time of the procedure: Secondary | ICD-10-CM | POA: Diagnosis not present

## 2017-10-03 DIAGNOSIS — B370 Candidal stomatitis: Secondary | ICD-10-CM | POA: Insufficient documentation

## 2017-10-03 DIAGNOSIS — Z923 Personal history of irradiation: Secondary | ICD-10-CM | POA: Diagnosis not present

## 2017-10-03 DIAGNOSIS — Z79899 Other long term (current) drug therapy: Secondary | ICD-10-CM | POA: Insufficient documentation

## 2017-10-03 DIAGNOSIS — Z5111 Encounter for antineoplastic chemotherapy: Secondary | ICD-10-CM | POA: Insufficient documentation

## 2017-10-03 LAB — CBC WITH DIFFERENTIAL/PLATELET
BASOS ABS: 0 10*3/uL (ref 0.0–0.1)
Basophils Relative: 1 %
Eosinophils Absolute: 0 10*3/uL (ref 0.0–0.7)
Eosinophils Relative: 0 %
HEMATOCRIT: 38.4 % — AB (ref 39.0–52.0)
HEMOGLOBIN: 12.7 g/dL — AB (ref 13.0–17.0)
LYMPHS PCT: 18 %
Lymphs Abs: 0.4 10*3/uL (ref 0.7–4.0)
MCH: 30.7 pg (ref 26.0–34.0)
MCHC: 33.1 g/dL (ref 30.0–36.0)
MCV: 92.8 fL (ref 78.0–100.0)
MONOS PCT: 14 %
Monocytes Absolute: 0.3 10*3/uL (ref 0.1–1.0)
NEUTROS ABS: 1.6 10*3/uL (ref 1.7–7.7)
NEUTROS PCT: 67 %
Platelets: 152 10*3/uL (ref 150–400)
RBC: 4.14 MIL/uL — ABNORMAL LOW (ref 4.22–5.81)
RDW: 15.4 % (ref 11.5–15.5)
WBC: 2.4 10*3/uL — ABNORMAL LOW (ref 4.0–10.5)

## 2017-10-03 LAB — COMPREHENSIVE METABOLIC PANEL
ALK PHOS: 101 U/L (ref 38–126)
ALT: 26 U/L (ref 0–44)
ANION GAP: 10 (ref 5–15)
AST: 19 U/L (ref 15–41)
Albumin: 3.5 g/dL (ref 3.5–5.0)
BUN: 16 mg/dL (ref 6–20)
CO2: 30 mmol/L (ref 22–32)
Calcium: 9.3 mg/dL (ref 8.9–10.3)
Chloride: 98 mmol/L (ref 98–111)
Creatinine, Ser: 0.67 mg/dL (ref 0.61–1.24)
GFR calc non Af Amer: >60 mL/min (ref >60)
Glucose, Bld: 115 mg/dL — ABNORMAL HIGH (ref 70–99)
POTASSIUM: 4.7 mmol/L (ref 3.5–5.1)
SODIUM: 138 mmol/L (ref 135–145)
Total Bilirubin: 0.6 mg/dL (ref 0.3–1.2)
Total Protein: 7 g/dL (ref 6.5–8.1)

## 2017-10-04 ENCOUNTER — Inpatient Hospital Stay (HOSPITAL_COMMUNITY): Payer: BLUE CROSS/BLUE SHIELD | Attending: Hematology | Admitting: Hematology

## 2017-10-04 ENCOUNTER — Encounter (HOSPITAL_COMMUNITY): Payer: Self-pay | Admitting: Hematology

## 2017-10-04 ENCOUNTER — Other Ambulatory Visit (HOSPITAL_COMMUNITY): Payer: Self-pay | Admitting: *Deleted

## 2017-10-04 ENCOUNTER — Inpatient Hospital Stay (HOSPITAL_COMMUNITY): Payer: BLUE CROSS/BLUE SHIELD

## 2017-10-04 ENCOUNTER — Other Ambulatory Visit (HOSPITAL_COMMUNITY): Payer: BLUE CROSS/BLUE SHIELD

## 2017-10-04 VITALS — BP 142/83 | HR 88 | Temp 97.8°F | Resp 18 | Wt 165.2 lb

## 2017-10-04 DIAGNOSIS — G4733 Obstructive sleep apnea (adult) (pediatric): Secondary | ICD-10-CM | POA: Insufficient documentation

## 2017-10-04 DIAGNOSIS — Y842 Radiological procedure and radiotherapy as the cause of abnormal reaction of the patient, or of later complication, without mention of misadventure at the time of the procedure: Secondary | ICD-10-CM | POA: Diagnosis not present

## 2017-10-04 DIAGNOSIS — I10 Essential (primary) hypertension: Secondary | ICD-10-CM | POA: Diagnosis not present

## 2017-10-04 DIAGNOSIS — Z923 Personal history of irradiation: Secondary | ICD-10-CM | POA: Diagnosis not present

## 2017-10-04 DIAGNOSIS — K2950 Unspecified chronic gastritis without bleeding: Secondary | ICD-10-CM | POA: Diagnosis not present

## 2017-10-04 DIAGNOSIS — B370 Candidal stomatitis: Secondary | ICD-10-CM | POA: Diagnosis not present

## 2017-10-04 DIAGNOSIS — B379 Candidiasis, unspecified: Secondary | ICD-10-CM

## 2017-10-04 DIAGNOSIS — C160 Malignant neoplasm of cardia: Secondary | ICD-10-CM

## 2017-10-04 DIAGNOSIS — Z79899 Other long term (current) drug therapy: Secondary | ICD-10-CM | POA: Diagnosis not present

## 2017-10-04 DIAGNOSIS — R112 Nausea with vomiting, unspecified: Secondary | ICD-10-CM | POA: Insufficient documentation

## 2017-10-04 DIAGNOSIS — F17210 Nicotine dependence, cigarettes, uncomplicated: Secondary | ICD-10-CM | POA: Diagnosis not present

## 2017-10-04 DIAGNOSIS — Z934 Other artificial openings of gastrointestinal tract status: Secondary | ICD-10-CM | POA: Diagnosis not present

## 2017-10-04 DIAGNOSIS — K3189 Other diseases of stomach and duodenum: Secondary | ICD-10-CM | POA: Diagnosis not present

## 2017-10-04 DIAGNOSIS — E785 Hyperlipidemia, unspecified: Secondary | ICD-10-CM | POA: Diagnosis not present

## 2017-10-04 DIAGNOSIS — R5383 Other fatigue: Secondary | ICD-10-CM | POA: Diagnosis not present

## 2017-10-04 MED ORDER — MORPHINE SULFATE (CONCENTRATE) 10 MG /0.5 ML PO SOLN: 10.0000 mg | ORAL | 0 refills | Status: AC | PRN

## 2017-10-04 MED ORDER — FLUCONAZOLE IN SODIUM CHLORIDE 400-0.9 MG/200ML-% IV SOLN: 400.0000 mg | INTRAVENOUS | Status: DC

## 2017-10-04 MED ORDER — HEPARIN SOD (PORK) LOCK FLUSH 100 UNIT/ML IV SOLN
500.0000 [IU] | Freq: Once | INTRAVENOUS | Status: AC | PRN
  Administered 2017-10-04: 500 [IU]
  Filled 2017-10-04: qty 5

## 2017-10-04 MED ORDER — LIDOCAINE VISCOUS HCL 2 % MT SOLN: OROMUCOSAL | 2 refills | Status: AC

## 2017-10-04 MED ORDER — FAMOTIDINE IN NACL 20-0.9 MG/50ML-% IV SOLN
20.0000 mg | Freq: Once | INTRAVENOUS | Status: AC
  Administered 2017-10-04: 20 mg via INTRAVENOUS
  Filled 2017-10-04: qty 50

## 2017-10-04 MED ORDER — SODIUM CHLORIDE 0.9 % IV SOLN
50.0000 mg/m2 | Freq: Once | INTRAVENOUS | Status: AC
  Administered 2017-10-04: 102 mg via INTRAVENOUS
  Filled 2017-10-04: qty 17

## 2017-10-04 MED ORDER — SODIUM CHLORIDE 0.9 % IV SOLN
20.0000 mg | Freq: Once | INTRAVENOUS | Status: AC
  Administered 2017-10-04: 20 mg via INTRAVENOUS
  Filled 2017-10-04: qty 2

## 2017-10-04 MED ORDER — SODIUM CHLORIDE 0.9 % IV SOLN
260.0000 mg | Freq: Once | INTRAVENOUS | Status: AC
  Administered 2017-10-04: 260 mg via INTRAVENOUS
  Filled 2017-10-04: qty 26

## 2017-10-04 MED ORDER — SODIUM CHLORIDE 0.9 % IV SOLN
Freq: Once | INTRAVENOUS | Status: AC
  Administered 2017-10-04: 11:00:00 via INTRAVENOUS

## 2017-10-04 MED ORDER — DIPHENHYDRAMINE HCL 50 MG/ML IJ SOLN
50.0000 mg | Freq: Once | INTRAMUSCULAR | Status: AC
  Administered 2017-10-04: 50 mg via INTRAVENOUS
  Filled 2017-10-04: qty 1

## 2017-10-04 MED ORDER — FLUCONAZOLE IN SODIUM CHLORIDE 400-0.9 MG/200ML-% IV SOLN
400.0000 mg | INTRAVENOUS | Status: DC
  Administered 2017-10-04: 400 mg via INTRAVENOUS
  Filled 2017-10-04 (×2): qty 200

## 2017-10-04 MED ORDER — SODIUM CHLORIDE 0.9% FLUSH
10.0000 mL | INTRAVENOUS | Status: DC | PRN
  Administered 2017-10-04: 10 mL
  Filled 2017-10-04: qty 10

## 2017-10-04 MED ORDER — PALONOSETRON HCL INJECTION 0.25 MG/5ML
0.2500 mg | Freq: Once | INTRAVENOUS | Status: AC
  Administered 2017-10-04: 0.25 mg via INTRAVENOUS
  Filled 2017-10-04: qty 5

## 2017-10-04 NOTE — Progress Notes (Signed)
To treatment area for oncology follow up and chemotherapy.  Patient taking nystatin for thrush from radiation therapy doctor.  Tongue with white coating noted and decreased oral intake.    Patient seen by Dr. Delton Coombes with lab review and ok to treat with chemotherapy today.   Patient tolerated chemotherapy with no complaints voiced.  Good blood return noted before and after administration of chemotherapy.  No bruising or swelling noted at site.  Band aid applied.  VSs with discharge and left ambulatory with no s/s of distress noted.

## 2017-10-04 NOTE — Patient Instructions (Signed)
[facility] Discharge Instructions for Patients Receiving Chemotherapy  Today you received the following chemotherapy agents taxol and carboplatin   If you develop nausea and vomiting that is not controlled by your nausea medication, call the clinic.   BELOW ARE SYMPTOMS THAT SHOULD BE REPORTED IMMEDIATELY:  *FEVER GREATER THAN 100.5 F  *CHILLS WITH OR WITHOUT FEVER  NAUSEA AND VOMITING THAT IS NOT CONTROLLED WITH YOUR NAUSEA MEDICATION  *UNUSUAL SHORTNESS OF BREATH  *UNUSUAL BRUISING OR BLEEDING  TENDERNESS IN MOUTH AND THROAT WITH OR WITHOUT PRESENCE OF ULCERS  *URINARY PROBLEMS  *BOWEL PROBLEMS  UNUSUAL RASH Items with * indicate a potential emergency and should be followed up as soon as possible.  Feel free to call the clinic should you have any questions or concerns. The clinic phone number is [phone].  Please show the CHEMO ALERT CARD at check-in to the Emergency Department and triage nurse.   

## 2017-10-04 NOTE — Patient Instructions (Signed)
[facility] Discharge Instructions  Follow up next week with labs.    Thank you for choosing [facility] at [facility] to provide your oncology and hematology care.  To afford each patient quality time with our provider, please arrive at least 15 minutes before your scheduled appointment time.   If you have a lab appointment with the Cancer Center please come in thru the  Main Entrance and check in at the main information desk  You need to re-schedule your appointment should you arrive 10 or more minutes late.  We strive to give you quality time with our providers, and arriving late affects you and other patients whose appointments are after yours.  Also, if you no show three or more times for appointments you may be dismissed from the clinic at the providers discretion.     Again, thank you for choosing [facility].  Our hope is that these requests will decrease the amount of time that you wait before being seen by our physicians.       _____________________________________________________________  Should you have questions after your visit to  Cancer Center, please contact our office at [phone] between the hours of 8:00 a.m. and 4:30 p.m.  Voicemails left after 4:00 p.m. will not be returned until the following business day.  For prescription refill requests, have your pharmacy contact our office and allow 72 hours.    Cancer Center Support Programs:   > Cancer Support Group  2nd Tuesday of the month 1pm-2pm, Journey Room    

## 2017-10-04 NOTE — Progress Notes (Signed)
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[place])    08/02/2017 -  Chemotherapy    The patient had palonosetron (ALOXI) injection 0.25 mg, 0.25 mg, Intravenous,  Once, 1 of 1 cycle Administration: 0.25 mg (08/30/2017), 0.25 mg (09/06/2017), 0.25 mg (09/13/2017), 0.25 mg (09/20/2017), 0.25 mg (10/04/2017) CARBOplatin (PARAPLATIN) 300 mg in sodium chloride 0.9 % 250 mL chemo infusion, 300 mg (100 % of original dose 299.2 mg), Intravenous,  Once, 1 of 1 cycle Dose modification:   (original dose 299.2 mg, Cycle 1),   (original dose 299.2 mg, Cycle 1),   (original dose 299.2 mg, Cycle 1),   (original dose 299.2 mg, Cycle 1),   (original dose 299.2 mg, Cycle 1) Administration: 300 mg (08/30/2017), 300 mg (09/06/2017), 300 mg (09/13/2017), 300 mg (09/20/2017), 260 mg (10/04/2017) PACLitaxel (TAXOL) 102 mg in sodium chloride 0.9 % 250 mL chemo infusion (</= 80mg /m2), 50 mg/m2 = 102 mg, Intravenous,  Once, 1 of 1 cycle Administration: 102 mg (08/30/2017), 102 mg (09/06/2017), 102 mg (09/13/2017), 102 mg (09/20/2017), 102 mg (10/04/2017)  for chemotherapy treatment.       CANCER STAGING: Cancer Staging No matching staging information was found for the patient.   INTERVAL HISTORY:  [name] 62 y.o. Lane returns for routine follow-up for GE junction adenocarcinoma and consideration for next cycle of chemotherapy. Patient is here today with his wife. He is doing well since his recent hospitalization. He has had no fevers since. He has thrush on his tongue and was prescribed nystatin. He will start this medication today. He is still  throwing up the phlegm a few times a day. He will be having surgery at [facility] after he finishes chemo and radiation. He has a follow up appointment with them next week. Patient is trying to remain active at home. He has not been getting all his tube feeding since he has been out of the hospital due to feeling full and a little nausous with them. They are slowly increasing them back. Patient states he is also having gastric spasms. It will tighten up and slowly relax and this happens several times a day. Patient denies any headaches. Denies any diarrhea or constipation. His appetite and energy level remain stable at 50%.      REVIEW OF SYSTEMS:  Review of Systems  Constitutional: Positive for fatigue.  HENT:   Positive for trouble swallowing.   Respiratory: Negative.   Cardiovascular: Negative.   Gastrointestinal: Positive for nausea and vomiting.  Endocrine: Negative.   Genitourinary: Negative.    Musculoskeletal: Negative.   Skin: Negative.   Neurological: Negative.   Hematological: Negative.   Psychiatric/Behavioral: Negative.      PAST MEDICAL/SURGICAL HISTORY:  Past Medical History:  Diagnosis Date  . Cancer ([place])    stomach  . Hyperlipidemia   . Hypertension   . Sleep apnea, obstructive   . Thoracic aortic aneurysm [facility])    Past Surgical History:  Procedure Laterality Date  . colonoscopy     [place] 69 polyps removed, age 24: ? POLYPS, AGE 52: ? POLYPS  . ESOPHAGEAL DILATION N/A 06/21/2017  Procedure: ESOPHAGEAL DILATION;  Surgeon: Danie Binder, MD;  Location[facility] ENDO SU[facility];  Service: Endoscopy;  Laterality: N/A;  . ESOPHAGOGASTRODUODENOSCOPY N/A 06/21/2017   Procedure: ESOPHAGOGASTRODUODENOSCOPY (EGD);  Surgeon: Danie Binder, MD;  Location: [facility] ENDO SUITE;  Service: Endoscopy;  Laterality: N/A;  . EUS N/A 07/28/2017   Procedure: UPPER ENDOSCOPIC ULTRASOUND (EUS) RADIAL;  Surgeon: [name], MD;  Location: [facility] ENDOSCOPY;  Service: Endoscopy;   Laterality: N/A;  . EYE SURGERY Bilateral    cataract  . JEJUNOSTOMY N/A 08/03/2017   Procedure: OPEN JEJUNOSTOMY TUBE PLACEMENT;  Surgeon: Virl Cagey, MD;  Location: [facility] ORS;  Service: General;  Laterality: N/A;  . PORTACATH PLACEMENT N/A 08/03/2017   Procedure: INSERTION PORT-A-CATH;  Surgeon: Virl Cagey, MD;  Location: [facility] ORS;  Service: General;  Laterality: N/A;     SOCIAL HISTORY:  Social History   Socioeconomic History  . Marital status: Married    Spouse name: Not on file  . Number of children: Not on file  . Years of education: Not on file  . Highest education level: Not on file  Occupational History  . Not on file  Social Needs  . Financial resource strain: Not on file  . Food insecurity:    Worry: Not on file    Inability: Not on file  . Transportation needs:    Medical: Not on file    Non-medical: Not on file  Tobacco Use  . Smoking status: Current Some Day Smoker    Packs/day: 1.00    Types: Cigarettes  . Smokeless tobacco: Never Used  Substance and Sexual Activity  . Alcohol use: Not Currently  . Drug use: Never  . Sexual activity: Not on file  Lifestyle  . Physical activity:    Days per week: Not on file    Minutes per session: Not on file  . Stress: Not on file  Relationships  . Social connections:    Talks on phone: Not on file    Gets together: Not on file    Attends religious service: Not on file    Active member of club or organization: Not on file    Attends meetings of clubs or organizations: Not on file    Relationship status: Not on file  . Intimate partner violence:    Fear of current or ex partner: Not on file    Emotionally abused: Not on file    Physically abused: Not on file    Forced sexual activity: Not on file  Other Topics Concern  . Not on file  Social History Narrative  . Not on file    FAMILY HISTORY:  Family History  Problem Relation Age of Onset  . Hypertension Mother   . Hypertension Father   .  Diabetes Father   . Colon cancer Neg Hx   . Colon polyps Neg Hx     CURRENT MEDICATIONS:  Outpatient Encounter Medications as of 10/04/2017  Medication Sig  . acetaminophen (TYLENOL) 160 MG/5ML liquid Take by mouth every 4 (four) hours as needed for fever.  Marland Kitchen amoxicillin-clavulanate (AUGMENTIN) 250-62.5 MG/5ML suspension Take 10 mLs (500 mg total) by mouth 2 (two) times daily for 7 days.  . cyclobenzaprine (FLEXERIL) 5 MG tablet Take 1 tablet (5 mg total) by mouth 3 (three) times daily as needed for muscle spasms.  Marland Kitchen docusate (COLACE) 50 MG/5ML liquid Place 10 mLs (100 mg total) into feeding tube 2 (two) times daily as needed for mild constipation.  Marland Kitchen  lidocaine-prilocaine (EMLA) cream Apply to affected area once  . Loperamide HCl (IMODIUM A-D) 1 MG/7.5ML LIQD Take by mouth.  . Morphine Sulfate (MORPHINE CONCENTRATE) 10 mg / 0.5 ml concentrated solution Take 0.5 mLs (10 mg total) by mouth every 4 (four) hours as needed for severe pain.  . Nutritional Supplements (FEEDING SUPPLEMENT, OSMOLITE 1.5 CAL,) LIQD Give osmolite 1.5 at 131ml/hr for 12 hours via continuous pump through J-tube per day.  Start at rate of 157ml/hr for 12 hours and increase by 24ml daily until goal rate of 194ml reached.  . ondansetron (ZOFRAN-ODT) 4 MG disintegrating tablet Take 1 tablet (4 mg total) by mouth every 6 (six) hours as needed for nausea or vomiting.  . prochlorperazine (COMPAZINE) 10 MG tablet Take 1 tablet (10 mg total) by mouth every 6 (six) hours as needed for nausea or vomiting.  . sucralfate (CARAFATE) 1 GM/10ML suspension Take 10 mLs (1 g total) by mouth 4 (four) times daily -  with meals and at bedtime.  . Water For Irrigation, Sterile (FREE WATER) SOLN Place 200 mLs into feeding tube every 8 (eight) hours.  . Zolpidem Tartrate 5 MG/ACT SOLN Take 1 Act (5 mg total) by mouth at bedtime.   Facility-Administered Encounter Medications as of 10/04/2017  Medication  . [DISCONTINUED] fluconazole (DIFLUCAN) IVPB  400 mg    ALLERGIES:  Allergies  Allergen Reactions  . Adhesive [Tape] Other (See Comments)    Paper tape only     PHYSICAL EXAM:  ECOG Performance status: 1  VITAL SIGNS:BP 126/54, P: 71, R:16, TEMP:98.6, SATS:100% KPTWSF:681.2  Physical Exam  Constitutional: He is oriented to person, place, and time.  Cardiovascular: Normal rate, regular rhythm and normal heart sounds.  Pulmonary/Chest: Effort normal and breath sounds normal.  Neurological: He is alert and oriented to person, place, and time.  Skin: Skin is warm and dry.     LABORATORY DATA:  I have reviewed the labs as listed.  CBC    Component Value Date/Time   WBC 2.4 (L) 10/03/2017 1102   RBC 4.14 (L) 10/03/2017 1102   HGB 12.7 (L) 10/03/2017 1102   HCT 38.4 (L) 10/03/2017 1102   PLT 152 10/03/2017 1102   MCV 92.8 10/03/2017 1102   MCH 30.7 10/03/2017 1102   MCHC 33.1 10/03/2017 1102   RDW 15.4 10/03/2017 1102   LYMPHSABS 0.4 10/03/2017 1102   MONOABS 0.3 10/03/2017 1102   EOSABS 0.0 10/03/2017 1102   BASOSABS 0.0 10/03/2017 1102   CMP Latest Ref Rng & Units 10/03/2017 09/28/2017 09/27/2017  Glucose 70 - 99 mg/dL 115(H) 241(H) 100(H)  BUN 6 - 20 mg/dL 16 11 12   Creatinine 0.61 - 1.24 mg/dL 0.67 0.59(L) 0.61  Sodium 135 - 145 mmol/L 138 132(L) 135  Potassium 3.5 - 5.1 mmol/L 4.7 3.3(L) 4.0  Chloride 98 - 111 mmol/L 98 97(L) 103  CO2 22 - 32 mmol/L 30 27 27   Calcium 8.9 - 10.3 mg/dL 9.3 8.0(L) 8.1(L)  Total Protein 6.5 - 8.1 g/dL 7.0 - 5.6(L)  Total Bilirubin 0.3 - 1.2 mg/dL 0.6 - 0.4  Alkaline Phos 38 - 126 U/L 101 - 79  AST 15 - 41 U/L 19 - 24  ALT 0 - 44 U/L 26 - 31        ASSESSMENT & PLAN:   Gastroesophageal cancer (HCC) 1.  GE junction poorly differentiated adenocarcinoma (uT3uNx): - EGD on 06/21/2017 shows benign-appearing stenosis which was dilated in the distal esophagus.  Localized prominent gastric folds were found in  the cardia, which was biopsied.  There was also localized mild  inflammation with congestion and erythema in the antrum, biopsies were taken for H. pylori testing. - Stomach biopsy shows chronic gastritis with H. pylori positive.  Esophageal gastric junction biopsy shows poorly differentiated adenocarcinoma. -I have discussed the findings of the pathology report with the patient and his wife in detail. -PET CT scan dated 07/12/2017 showed hypermetabolic uptake in the distal esophagus, extending into GE junction.  The segment is approximately 4.2 cm Ells. - On 07/28/2017 EGD/EUS done by Dr. Edison Nasuti showed circumferential tight, nearly obstructing stricture at GE junction, 1.3 cm thick, with the lumen of 1 to 2 mm.  UT3 NX. -Jejunostomy tube was placed on 08/03/2017.  He lost about 12 pounds since the diagnosis.  He is receiving tube feeds via pump over more than 12 hours at nighttime, at 110-140 mL/h.  He is tolerating them very well.  He has not lost any weight in the last couple of weeks. - Weekly carbo platinum and paclitaxel with radiation started on 08/30/2017 - Received cycle 4 treatment on 09/20/2017.  Was admitted to the hospital with neutropenic fever for 2 to 3 days. -He was evaluated at [facility] as a multidisciplinary consultation for second opinion.  He would like to proceed with surgery with Dr. Williemae Natter at [facility] upon completion of chemoradiation therapy.  He already has a PET scan scheduled on 11/07/2017. -His blood counts are adequate to proceed with his cycle 5 today.  He did not develop any side effects from chemotherapy. -He has epigastric pain on and off, most part of the day.  I will start him on morphine 10 mg / 0.5 mL to be taken every 4 hours as needed. -he has oral thrush.  He is using nystatin.  We will give him Diflucan IV today.  He will continue nystatin.  He was also prescribed Maalox with viscous lidocaine to be taken every 6 hours as needed for pain. -We will reevaluate him in 1 week to check his blood  counts.  He is finishing up radiation therapy this Thursday.      Orders placed this encounter:  Orders Placed This Encounter  Procedures  . CBC with Differential/Platelet  . Comprehensive metabolic panel      Derek Jack, MD [place] [phone]

## 2017-10-04 NOTE — Assessment & Plan Note (Signed)
1.  GE junction poorly differentiated adenocarcinoma (uT3uNx): - EGD on 06/21/2017 shows benign-appearing stenosis which was dilated in the distal esophagus.  Localized prominent gastric folds were found in the cardia, which was biopsied.  There was also localized mild inflammation with congestion and erythema in the antrum, biopsies were taken for H. pylori testing. - Stomach biopsy shows chronic gastritis with H. pylori positive.  Esophageal gastric junction biopsy shows poorly differentiated adenocarcinoma. -I have discussed the findings of the pathology report with the patient and his wife in detail. -PET CT scan dated 07/12/2017 showed hypermetabolic uptake in the distal esophagus, extending into GE junction.  The segment is approximately 4.2 cm Deshazo. - On 07/28/2017 EGD/EUS done by Dr. Edison Nasuti showed circumferential tight, nearly obstructing stricture at GE junction, 1.3 cm thick, with the lumen of 1 to 2 mm.  UT3 NX. -Jejunostomy tube was placed on 08/03/2017.  He lost about 12 pounds since the diagnosis.  He is receiving tube feeds via pump over more than 12 hours at nighttime, at 110-140 mL/h.  He is tolerating them very well.  He has not lost any weight in the last couple of weeks. - Weekly carbo platinum and paclitaxel with radiation started on 08/30/2017 - Received cycle 4 treatment on 09/20/2017.  Was admitted to the hospital with neutropenic fever for 2 to 3 days. -He was evaluated at [facility] as a multidisciplinary consultation for second opinion.  He would like to proceed with surgery with Dr. Williemae Natter at [facility] upon completion of chemoradiation therapy.  He already has a PET scan scheduled on 11/07/2017. -His blood counts are adequate to proceed with his cycle 5 today.  He did not develop any side effects from chemotherapy. -He has epigastric pain on and off, most part of the day.  I will start him on morphine 10 mg / 0.5 mL to be taken every 4 hours as  needed. -he has oral thrush.  He is using nystatin.  We will give him Diflucan IV today.  He will continue nystatin.  He was also prescribed Maalox with viscous lidocaine to be taken every 6 hours as needed for pain. -We will reevaluate him in 1 week to check his blood counts.  He is finishing up radiation therapy this Thursday.

## 2017-10-09 ENCOUNTER — Emergency Department (HOSPITAL_COMMUNITY)
Admission: EM | Admit: 2017-10-09 | Discharge: 2017-10-09 | Disposition: A | Discharge status: Home / Self Care | Payer: BLUE CROSS/BLUE SHIELD | Attending: Emergency Medicine | Admitting: Emergency Medicine

## 2017-10-09 ENCOUNTER — Encounter (HOSPITAL_COMMUNITY): Payer: Self-pay | Admitting: *Deleted

## 2017-10-09 ENCOUNTER — Other Ambulatory Visit: Payer: Self-pay

## 2017-10-09 DIAGNOSIS — I10 Essential (primary) hypertension: Secondary | ICD-10-CM | POA: Diagnosis not present

## 2017-10-09 DIAGNOSIS — Z79899 Other long term (current) drug therapy: Secondary | ICD-10-CM | POA: Diagnosis not present

## 2017-10-09 DIAGNOSIS — K9423 Gastrostomy malfunction: Secondary | ICD-10-CM | POA: Insufficient documentation

## 2017-10-09 DIAGNOSIS — T85598A Other mechanical complication of other gastrointestinal prosthetic devices, implants and grafts, initial encounter: Secondary | ICD-10-CM

## 2017-10-09 DIAGNOSIS — E785 Hyperlipidemia, unspecified: Secondary | ICD-10-CM | POA: Insufficient documentation

## 2017-10-09 DIAGNOSIS — Z87891 Personal history of nicotine dependence: Secondary | ICD-10-CM | POA: Diagnosis not present

## 2017-10-09 NOTE — ED Provider Notes (Signed)
[facility] EMERGENCY DEPARTMENT Provider Note   CSN: [id number] Arrival date & time: 10/09/17  1456     History   Chief Complaint Chief Complaint  Patient presents with  . J tube clogged    HPI Phillip Lane is a 62 y.o. male.  HPI Patient presents for a clogged J-tube.  He has a J-tube due to stomach cancer.  Had some issues with plugging about a week ago but it cleared up and then last night began to have more difficulty.  Had some feeding today but then could not flush anything.  Patient was without new complaints.  His entire intake goes through the J-tube. Past Medical History:  Diagnosis Date  . Cancer ([place])    stomach  . Hyperlipidemia   . Hypertension   . Sleep apnea, obstructive   . Thoracic aortic aneurysm [facility])     Patient Active Problem List   Diagnosis Date Noted  . Essential hypertension 09/26/2017  . Hyperlipidemia 09/26/2017  . SIRS (systemic inflammatory response syndrome) ([place]) 09/26/2017  . Fever 09/26/2017  . Protein-calorie malnutrition, severe 08/10/2017  . Gastric cancer ([place]) 08/03/2017  . Gastroesophageal cancer ([place]) 07/04/2017  . Food impaction of esophagus 06/21/2017  . Gastritis and gastroduodenitis   . Esophageal dysphagia     Past Surgical History:  Procedure Laterality Date  . colonoscopy     [place] 87 [place] removed, age 58: ? POLYPS, AGE 66: ? POLYPS  . ESOPHAGEAL DILATION N/A 06/21/2017   Procedure: ESOPHAGEAL DILATION;  Surgeon: Danie Binder, MD;  [facility];  Service: Endoscopy;  Laterality: N/A;  . ESOPHAGOGASTRODUODENOSCOPY N/A 06/21/2017   Procedure: ESOPHAGOGASTRODUODENOSCOPY (EGD);  Surgeon: Danie Binder, MD;  Location: [facility]O [facility];  Service: Endoscopy;  Laterality: N/A;  . EUS N/A 07/28/2017   Procedure: UPPER ENDOSCOPIC ULTRASOUND (EUS) RADIAL;  Surgeon: [name], MD;  Location: [facility] ENDOSCOPY;  Service: Endoscopy;  Laterality: N/A;  . EYE SURGERY Bilateral    cataract  . JEJUNOSTOMY  N/A 08/03/2017   Procedure: OPEN JEJUNOSTOMY TUBE PLACEMENT;  Surgeon: Virl Cagey, MD;  Location: [facility] ORS;  Service: General;  Laterality: N/A;  . PORTACATH PLACEMENT N/A 08/03/2017   Procedure: INSERTION PORT-A-CATH;  Surgeon: Virl Cagey, MD;  Location: [facility] ORS;  Service: General;  Laterality: N/A;        Home Medications    Prior to Admission medications   Medication Sig Start Date End Date Taking? Authorizing Provider  acetaminophen (TYLENOL) 160 MG/5ML liquid Take by mouth every 4 (four) hours as needed for fever.    [provider]  cyclobenzaprine (FLEXERIL) 5 MG tablet Take 1 tablet (5 mg total) by mouth 3 (three) times daily as needed for muscle spasms. 08/16/17   Derek Jack, MD  docusate (COLACE) 50 MG/5ML liquid Place 10 mLs (100 mg total) into feeding tube 2 (two) times daily as needed for mild constipation. 09/28/17   Barton Dubois, MD  lidocaine (XYLOCAINE) 2 % solution Viscous lidocaine 2% and Maalox. 1:1 concentration. Swish and swallow 1 tablespoon four times a day. 10/04/17   Derek Jack, MD  lidocaine-prilocaine (EMLA) cream Apply to affected area once 08/02/17   Derek Jack, MD  Loperamide HCl (IMODIUM A-D) 1 MG/7.5ML LIQD Take by mouth.    [provider]  Morphine Sulfate (MORPHINE CONCENTRATE) 10 mg / 0.5 ml concentrated solution Take 0.5 mLs (10 mg total) by mouth every 4 (four) hours as needed for severe pain. 10/04/17   Lockamy,  Randi L, NP-C  Nutritional Supplements (FEEDING SUPPLEMENT, OSMOLITE 1.5 CAL,) LIQD Give osmolite 1.5 at 167ml/hr for 12 hours via continuous pump through J-tube per day.  Start at rate of 143ml/hr for 12 hours and increase by 12ml daily until goal rate of 124ml reached. 08/19/17   Derek Jack, MD  ondansetron (ZOFRAN-ODT) 4 MG disintegrating tablet Take 1 tablet (4 mg total) by mouth every 6 (six) hours as needed for nausea or vomiting. 08/11/17   Virl Cagey, MD    prochlorperazine (COMPAZINE) 10 MG tablet Take 1 tablet (10 mg total) by mouth every 6 (six) hours as needed for nausea or vomiting. 08/26/17   Derek Jack, MD  sucralfate (CARAFATE) 1 GM/10ML suspension Take 10 mLs (1 g total) by mouth 4 (four) times daily -  with meals and at bedtime. 08/11/17   Virl Cagey, MD  Water For Irrigation, Sterile (FREE WATER) SOLN Place 200 mLs into feeding tube every 8 (eight) hours. 09/28/17   Barton Dubois, MD  Zolpidem Tartrate 5 MG/ACT SOLN Take 1 Act (5 mg total) by mouth at bedtime. 08/19/17   Derek Jack, MD    Family History Family History  Problem Relation Age of Onset  . Hypertension Mother   . Hypertension Father   . Diabetes Father   . Colon cancer Neg Hx   . Colon polyps Neg Hx     Social History Social History   Tobacco Use  . Smoking status: Former Smoker    Packs/day: 1.00    Types: Cigarettes  . Smokeless tobacco: Never Used  Substance Use Topics  . Alcohol use: Not Currently  . Drug use: Never     Allergies   Adhesive [tape]   Review of Systems Review of Systems  Constitutional: Negative for chills.  Gastrointestinal: Positive for abdominal pain. Negative for vomiting.     Physical Exam Updated Vital Signs BP 111/72 (BP Location: Right Arm)   Pulse (!) 107   Temp 98 F (36.7 C) (Oral)   Resp 16   Ht 5\' 10"  (1.778 m)   Wt 74.8 kg   SpO2 100%   BMI 23.68 kg/m   Physical Exam  Constitutional: He appears well-developed.  Cardiovascular: Normal rate.  Pulmonary/Chest: He has no wheezes.  Abdominal: There is no tenderness.  J-tube in place in upper abdomen.  There is some fluid in the tube.  Musculoskeletal: He exhibits no tenderness.  Neurological: He is alert.     ED Treatments / Results  Labs (all labs ordered are listed, but only abnormal results are displayed) Labs Reviewed - No data to display  EKG None  Radiology No results found.  Procedures Procedures (including  critical care time)  Medications Ordered in ED Medications - No data to display   Initial Impression / Assessment and Plan / ED Course  I have reviewed the triage vital signs and the nursing notes.  Pertinent labs & imaging results that were available during my care of the patient were reviewed by me and considered in my medical decision making (see chart for details).     Patient with clogged J-tube.  I was able to free it up with flushing with Coca-Cola.  Felt initial clearing of it and Coca-Cola was left to dwell twice to help any residual obstruction.  Patient's wife is very experienced with management of the J-tube.  Will discharge home.  Final Clinical Impressions(s) / ED Diagnoses   Final diagnoses:  Clogged feeding tube    ED  Discharge Orders    None       Davonna Belling, MD 10/09/17 [phone]

## 2017-10-09 NOTE — ED Triage Notes (Addendum)
Pt's wife reports pt's J tube is clogged since this morning. Wife reports she is concerned that the tube might have been flushed with medications that weren't supposed to go through it during his last hospital stay. Pt denies abdominal pain and nausea. Last chemo done Tuesday.

## 2017-10-09 NOTE — Discharge Instructions (Addendum)
Flush the tube more tonight to help clear out anything that may be partially occluding it.

## 2017-10-10 ENCOUNTER — Telehealth (HOSPITAL_COMMUNITY): Payer: Self-pay

## 2017-10-10 NOTE — Telephone Encounter (Signed)
Follow up phone call with the wife concerning the patients G-tube and blockage over the weekend.  The wife stated she took the patient on Sunday to the [facility] emergency room and the ER doctor used some Coke to unblock the tube.  Patient is still having prn nausea with tube feedings and using the compazine and zofran for nausea but only having fair relief.  Instructed the wife to let us know if he feels he needs hydration today, tomorrow, or on Wednesday before his scheduled appointment with Dr. Delton Coombes and we would work something out for him.  The wife verbalized understanding.

## 2017-10-11 ENCOUNTER — Ambulatory Visit (HOSPITAL_COMMUNITY): Payer: BLUE CROSS/BLUE SHIELD | Admitting: Hematology

## 2017-10-11 ENCOUNTER — Ambulatory Visit (HOSPITAL_COMMUNITY): Payer: BLUE CROSS/BLUE SHIELD

## 2017-10-12 ENCOUNTER — Encounter (HOSPITAL_COMMUNITY): Payer: Self-pay | Admitting: Hematology

## 2017-10-12 ENCOUNTER — Inpatient Hospital Stay (HOSPITAL_COMMUNITY): Payer: BLUE CROSS/BLUE SHIELD | Attending: Hematology | Admitting: Hematology

## 2017-10-12 ENCOUNTER — Encounter (HOSPITAL_COMMUNITY): Payer: Self-pay | Admitting: Dietician

## 2017-10-12 ENCOUNTER — Inpatient Hospital Stay (HOSPITAL_COMMUNITY): Payer: BLUE CROSS/BLUE SHIELD

## 2017-10-12 VITALS — BP 106/70 | HR 103 | Temp 97.6°F | Resp 14 | Wt 165.2 lb

## 2017-10-12 DIAGNOSIS — Z931 Gastrostomy status: Secondary | ICD-10-CM | POA: Diagnosis not present

## 2017-10-12 DIAGNOSIS — F17210 Nicotine dependence, cigarettes, uncomplicated: Secondary | ICD-10-CM

## 2017-10-12 DIAGNOSIS — E785 Hyperlipidemia, unspecified: Secondary | ICD-10-CM | POA: Diagnosis not present

## 2017-10-12 DIAGNOSIS — R112 Nausea with vomiting, unspecified: Secondary | ICD-10-CM | POA: Insufficient documentation

## 2017-10-12 DIAGNOSIS — Z923 Personal history of irradiation: Secondary | ICD-10-CM | POA: Insufficient documentation

## 2017-10-12 DIAGNOSIS — Z934 Other artificial openings of gastrointestinal tract status: Secondary | ICD-10-CM | POA: Diagnosis not present

## 2017-10-12 DIAGNOSIS — K2950 Unspecified chronic gastritis without bleeding: Secondary | ICD-10-CM | POA: Insufficient documentation

## 2017-10-12 DIAGNOSIS — C160 Malignant neoplasm of cardia: Secondary | ICD-10-CM | POA: Diagnosis present

## 2017-10-12 DIAGNOSIS — B370 Candidal stomatitis: Secondary | ICD-10-CM | POA: Insufficient documentation

## 2017-10-12 DIAGNOSIS — R531 Weakness: Secondary | ICD-10-CM | POA: Insufficient documentation

## 2017-10-12 DIAGNOSIS — B3789 Other sites of candidiasis: Secondary | ICD-10-CM | POA: Diagnosis not present

## 2017-10-12 DIAGNOSIS — I10 Essential (primary) hypertension: Secondary | ICD-10-CM

## 2017-10-12 DIAGNOSIS — Z87891 Personal history of nicotine dependence: Secondary | ICD-10-CM | POA: Diagnosis not present

## 2017-10-12 DIAGNOSIS — K3189 Other diseases of stomach and duodenum: Secondary | ICD-10-CM

## 2017-10-12 DIAGNOSIS — G4733 Obstructive sleep apnea (adult) (pediatric): Secondary | ICD-10-CM

## 2017-10-12 DIAGNOSIS — Z79899 Other long term (current) drug therapy: Secondary | ICD-10-CM | POA: Insufficient documentation

## 2017-10-12 DIAGNOSIS — R5383 Other fatigue: Secondary | ICD-10-CM | POA: Insufficient documentation

## 2017-10-12 DIAGNOSIS — Y842 Radiological procedure and radiotherapy as the cause of abnormal reaction of the patient, or of later complication, without mention of misadventure at the time of the procedure: Secondary | ICD-10-CM

## 2017-10-12 LAB — CBC WITH DIFFERENTIAL/PLATELET
BASOS PCT: 1 %
Basophils Absolute: 0.1 10*3/uL (ref 0.0–0.1)
EOS PCT: 0 %
Eosinophils Absolute: 0 10*3/uL (ref 0.0–0.7)
HEMATOCRIT: 35 % — AB (ref 39.0–52.0)
HEMOGLOBIN: 11.5 g/dL — AB (ref 13.0–17.0)
LYMPHS PCT: 25 %
Lymphs Abs: 1.5 10*3/uL (ref 0.7–4.0)
MCH: 30.3 pg (ref 26.0–34.0)
MCHC: 32.9 g/dL (ref 30.0–36.0)
MCV: 92.3 fL (ref 78.0–100.0)
MONOS PCT: 8 %
Monocytes Absolute: 0.5 10*3/uL (ref 0.1–1.0)
NEUTROS ABS: 3.8 10*3/uL (ref 1.7–7.7)
NEUTROS PCT: 66 %
Platelets: 347 10*3/uL (ref 150–400)
RBC: 3.79 MIL/uL — ABNORMAL LOW (ref 4.22–5.81)
RDW: 16.3 % — ABNORMAL HIGH (ref 11.5–15.5)
WBC: 5.9 10*3/uL (ref 4.0–10.5)

## 2017-10-12 LAB — COMPREHENSIVE METABOLIC PANEL
ALK PHOS: 95 U/L (ref 38–126)
ALT: 27 U/L (ref 0–44)
AST: 24 U/L (ref 15–41)
Albumin: 3.2 g/dL — ABNORMAL LOW (ref 3.5–5.0)
Anion gap: 9 (ref 5–15)
BUN: 14 mg/dL (ref 6–20)
CALCIUM: 9.1 mg/dL (ref 8.9–10.3)
CO2: 30 mmol/L (ref 22–32)
CREATININE: 0.61 mg/dL (ref 0.61–1.24)
Chloride: 96 mmol/L — ABNORMAL LOW (ref 98–111)
GFR calc non Af Amer: >60 mL/min (ref >60)
GLUCOSE: 94 mg/dL (ref 70–99)
Potassium: 4.1 mmol/L (ref 3.5–5.1)
Sodium: 135 mmol/L (ref 135–145)
Total Bilirubin: 0.5 mg/dL (ref 0.3–1.2)
Total Protein: 6.3 g/dL — ABNORMAL LOW (ref 6.5–8.1)

## 2017-10-12 NOTE — Assessment & Plan Note (Signed)
1.  GE junction poorly differentiated adenocarcinoma (uT3uNx): - EGD on 06/21/2017 shows benign-appearing stenosis which was dilated in the distal esophagus.  Localized prominent gastric folds were found in the cardia, which was biopsied.  There was also localized mild inflammation with congestion and erythema in the antrum, biopsies were taken for H. pylori testing. - Stomach biopsy shows chronic gastritis with H. pylori positive.  Esophageal gastric junction biopsy shows poorly differentiated adenocarcinoma. -I have discussed the findings of the pathology report with the patient and his wife in detail. -PET CT scan dated 07/12/2017 showed hypermetabolic uptake in the distal esophagus, extending into GE junction.  The segment is approximately 4.2 cm Javier. - On 07/28/2017 EGD/EUS done by Dr. Edison Nasuti showed circumferential tight, nearly obstructing stricture at GE junction, 1.3 cm thick, with the lumen of 1 to 2 mm.  UT3 NX. -Jejunostomy tube was placed on 08/03/2017.  He lost about 12 pounds since the diagnosis.  He is receiving tube feeds via pump over more than 12 hours at nighttime, at 110-140 mL/h.  He is tolerating them very well.  He has not lost any weight in the last couple of weeks. - Weekly carbo platinum and paclitaxel with radiation started on 08/30/2017 - Received cycle 4 treatment on 09/20/2017.  Was admitted to the hospital with neutropenic fever for 2 to 3 days. -He was evaluated at [facility] as a multidisciplinary consultation for second opinion.  He would like to proceed with surgery with Dr. Williemae Natter at [facility] upon completion of chemoradiation therapy.  He already has a PET scan scheduled on 11/07/2017. -Cycle 5 chemotherapy was on 10/04/2017.  He finished radiation therapy on 10/06/2017. -The epigastric pain has improved.  He is able to swallow water without regurgitation.  He had some problems with the G-tube clogging up over the weekend when he had to go  to the emergency room.  He does occasionally get nauseous when he starts tube feeds. -I have discussed the results of the blood work which have returned to almost normal.  He was advised to initiate some physical activity. - He has PET scan on 11/07/2017.  He will see me back end of November to discuss results and treatment plan.  He also sees Dr. Williemae Natter at [facility] on the same day of PET scan.

## 2017-10-12 NOTE — Progress Notes (Addendum)
RN notified RD that patient spouse would like to speak to RD regarding his tube becoming occluded.   Spouse notes that during their consultation with [facility] on the 12th, a resident there had recommended putting a probiotic through his tube. Spouse purchased a "childrens powdered probiotic" (does not know exact species/strain). She states they stopped administering after the 2nd time as they started to noticed tube becoming more difficult to flush. She also notes the new development of particles adhering to the inside lining of the tube.   She ultimately had to go to the ED 8/18 due to a clogged tube. The MD instilled coke into the tube and after multiple attempts, freed the clog.   This morning, however, the spouse noted the tube became re occluded and would not respond to forceful pressure. She says she left warm water in for a longer period, came back, forcefully pushed, and restored patency.   Spouse is worried this will occur again.   On inspection of tube, it does appear their are bacterial colonies adhered to the side of the tube. Strongly feel it is not tube feeding residue or medication remnants. Spoke with pharmacy and general surgery regarding suggestions about cleaning tube out.   Though there does appear to be colonies on tube, feel the clog is still likely more related to tube feeding/medications. Discussed potentially ordering viokase for clog removal, but this is not at pharmacy and unsure of logistics of obtaining this.   Ultimately, forwarded surgeons recommendations of instilling acidic beverage into tube for 10-20 minutes. Also described how to oscillate with syringe. In event tube is dislodged, gave handout on maintaining tube patency with water.   Per surgeon recommendation, told pt/spouse if occlusion unable to be removed or quickly returns, recommended going to ED to have IR replace.  Addendum: Regarding sufficiency of patients tube feeding, RD had noted patient has  had gradual weight loss. Spouse/pt report that they weigh patient regularly at home and are more consistent with the conditions at which he is weighed. They state pt "may be down 1 lb" in past week. Today they did say surgeon wants pt to start walking 2 miles each day. They are well aware patient needs to increase time on pump if loses weight. They verbalize they will continue watching this closely.   Burtis Junes RD, LDN, CNSC Clinical Nutrition Available Tues-Sat via Pager: [phone] 10/12/2017 5:10 PM

## 2017-10-12 NOTE — Progress Notes (Signed)
[address], [place] [address]   CLINIC:  Medical Oncology/Hematology  PCP:  Curlene Labrum, [name] [phone]   REASON FOR VISIT:  Follow-up for gastroesophageal cancer  CURRENT THERAPY: completed radiation and chemo  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[place])   07/04/2017 Initial Diagnosis    Adenocarcinoma of cardio-esophageal junction ([place])    08/02/2017 -  Chemotherapy    The patient had palonosetron (ALOXI) injection 0.25 mg, 0.25 mg, Intravenous,  Once, 1 of 1 cycle Administration: 0.25 mg (08/30/2017), 0.25 mg (09/06/2017), 0.25 mg (09/13/2017), 0.25 mg (09/20/2017), 0.25 mg (10/04/2017) CARBOplatin (PARAPLATIN) 300 mg in sodium chloride 0.9 % 250 mL chemo infusion, 300 mg (100 % of original dose 299.2 mg), Intravenous,  Once, 1 of 1 cycle Dose modification:   (original dose 299.2 mg, Cycle 1),   (original dose 299.2 mg, Cycle 1),   (original dose 299.2 mg, Cycle 1),   (original dose 299.2 mg, Cycle 1),   (original dose 299.2 mg, Cycle 1) Administration: 300 mg (08/30/2017), 300 mg (09/06/2017), 300 mg (09/13/2017), 300 mg (09/20/2017), 260 mg (10/04/2017) PACLitaxel (TAXOL) 102 mg in sodium chloride 0.9 % 250 mL chemo infusion (</= 80mg /m2), 50 mg/m2 = 102 mg, Intravenous,  Once, 1 of 1 cycle Administration: 102 mg (08/30/2017), 102 mg (09/06/2017), 102 mg (09/13/2017), 102 mg (09/20/2017), 102 mg (10/04/2017)  for chemotherapy treatment.       INTERVAL HISTORY:  [name] 62 y.o. male returns for routine follow-up for gastroesophageal cancer. Patient is here today with his wife. Patient is having trouble with his J tube clotting off. Patient denies nausea except while receiving tube feedings. Patient is having issues with resistance on his J tube when flushes. He has flushed it with coke and this helped. Patient is still using the nystatin 3 times a day for the thrush. It has improved but not completely  gone. Patient denies any new pains. Denies any headaches.    REVIEW OF SYSTEMS:  Review of Systems  Constitutional: Positive for fatigue.  HENT:   Positive for trouble swallowing.   Gastrointestinal: Positive for nausea and vomiting.  Neurological: Positive for extremity weakness.  All other systems reviewed and are negative.    PAST MEDICAL/SURGICAL HISTORY:  Past Medical History:  Diagnosis Date  . Cancer ([place])    stomach  . Hyperlipidemia   . Hypertension   . Sleep apnea, obstructive   . Thoracic aortic aneurysm [facility])    Past Surgical History:  Procedure Laterality Date  . colonoscopy     [place] 15 polyps [place], age 30: ? POLYPS, AGE 51: ? POLYPS  . ESOPHAGEAL DILATION N/A 06/21/2017   Procedure: ESOPHAGEAL DILATION;  Surgeon: Danie Binder, MD;  Location: [facility] ENDO SUITE;  Service: Endoscopy;  Laterality: N/A;  . ESOPHAGOGASTRODUODENOSCOPY N/A 06/21/2017   Procedure: ESOPHAGOGASTRODUODENOSCOPY (EGD);  Surgeon: Danie Binder, MD;  Location: [facility] ENDO SUITE;  Service: Endoscopy;  Laterality: N/A;  . EUS N/A 07/28/2017   Procedure: UPPER ENDOSCOPIC ULTRASOUND (EUS) RADIAL;  Surgeon: [name], MD;  Location: [facility] ENDOSCOPY;  Service: Endoscopy;  Laterality: N/A;  . EYE SURGERY Bilateral    cataract  . JEJUNOSTOMY N/A 08/03/2017   Procedure: OPEN JEJUNOSTOMY TUBE PLACEMENT;  Surgeon: Virl Cagey, MD;  Location: [facility] ORS;  Service: General;  Laterality: N/A;  . PORTACATH PLACEMENT N/A 08/03/2017   Procedure: INSERTION PORT-A-CATH;  Surgeon: Virl Cagey, MD;  Location: [facility] ORS;  Service: General;  Laterality: N/A;     SOCIAL HISTORY:  Social History   Socioeconomic History  . Marital status: Married    Spouse name: Not on file  . Number of children: Not on file  . Years of education: Not on file  . Highest education level: Not on file  Occupational History  . Not on file  Social Needs  . Financial resource strain: Not on file  . Food  insecurity:    Worry: Not on file    Inability: Not on file  . Transportation needs:    Medical: Not on file    Non-medical: Not on file  Tobacco Use  . Smoking status: Former Smoker    Packs/day: 1.00    Types: Cigarettes  . Smokeless tobacco: Never Used  Substance and Sexual Activity  . Alcohol use: Not Currently  . Drug use: Never  . Sexual activity: Not on file  Lifestyle  . Physical activity:    Days per week: Not on file    Minutes per session: Not on file  . Stress: Not on file  Relationships  . Social connections:    Talks on phone: Not on file    Gets together: Not on file    Attends religious service: Not on file    Active member of club or organization: Not on file    Attends meetings of clubs or organizations: Not on file    Relationship status: Not on file  . Intimate partner violence:    Fear of current or ex partner: Not on file    Emotionally abused: Not on file    Physically abused: Not on file    Forced sexual activity: Not on file  Other Topics Concern  . Not on file  Social History Narrative  . Not on file    FAMILY HISTORY:  Family History  Problem Relation Age of Onset  . Hypertension Mother   . Hypertension Father   . Diabetes Father   . Colon cancer Neg Hx   . Colon polyps Neg Hx     CURRENT MEDICATIONS:  Outpatient Encounter Medications as of 10/12/2017  Medication Sig  . acetaminophen (TYLENOL) 160 MG/5ML liquid Take by mouth every 4 (four) hours as needed for fever.  . cyclobenzaprine (FLEXERIL) 5 MG tablet Take 1 tablet (5 mg total) by mouth 3 (three) times daily as needed for muscle spasms.  Marland Kitchen docusate (COLACE) 50 MG/5ML liquid Place 10 mLs (100 mg total) into feeding tube 2 (two) times daily as needed for mild constipation.  . lidocaine (XYLOCAINE) 2 % solution Viscous lidocaine 2% and Maalox. 1:1 concentration. Swish and swallow 1 tablespoon four times a day.  . lidocaine-prilocaine (EMLA) cream Apply to affected area once  .  Loperamide HCl (IMODIUM A-D) 1 MG/7.5ML LIQD Take by mouth.  . Morphine Sulfate (MORPHINE CONCENTRATE) 10 mg / 0.5 ml concentrated solution Take 0.5 mLs (10 mg total) by mouth every 4 (four) hours as needed for severe pain.  . Nutritional Supplements (FEEDING SUPPLEMENT, OSMOLITE 1.5 CAL,) LIQD Give osmolite 1.5 at 15ml/hr for 12 hours via continuous pump through J-tube per day.  Start at rate of 151ml/hr for 12 hours and increase by 43ml daily until goal rate of 146ml reached.  . ondansetron (ZOFRAN-ODT) 4 MG disintegrating tablet Take 1 tablet (4 mg total) by mouth every 6 (six) hours as needed for nausea or vomiting.  . prochlorperazine (COMPAZINE) 10 MG tablet Take 1 tablet (10  mg total) by mouth every 6 (six) hours as needed for nausea or vomiting.  . sucralfate (CARAFATE) 1 GM/10ML suspension Take 10 mLs (1 g total) by mouth 4 (four) times daily -  with meals and at bedtime.  . Water For Irrigation, Sterile (FREE WATER) SOLN Place 200 mLs into feeding tube every 8 (eight) hours.  . Zolpidem Tartrate 5 MG/ACT SOLN Take 1 Act (5 mg total) by mouth at bedtime.   No facility-administered encounter medications on file as of 10/12/2017.     ALLERGIES:  Allergies  Allergen Reactions  . Adhesive [Tape] Other (See Comments)    Paper tape only     PHYSICAL EXAM:  ECOG Performance status: 1  Vitals:   10/12/17 1602  BP: 106/70  Pulse: (!) 103  Resp: 14  Temp: 97.6 F (36.4 C)  SpO2: 97%   Filed Weights   10/12/17 1602  Weight: 165 lb 3.2 oz (74.9 kg)    Physical Exam  Constitutional: He is oriented to person, place, and time. He appears well-developed and well-nourished.  Cardiovascular: Normal rate, regular rhythm and normal heart sounds.  Pulmonary/Chest: Effort normal and breath sounds normal.  Neurological: He is alert and oriented to person, place, and time.  Skin: Skin is warm and dry.  Abdomen: J-tube within normal limits.   LABORATORY DATA:  I have reviewed the labs  as listed.  CBC    Component Value Date/Time   WBC 5.9 10/12/2017 1455   RBC 3.79 (L) 10/12/2017 1455   HGB 11.5 (L) 10/12/2017 1455   HCT 35.0 (L) 10/12/2017 1455   PLT 347 10/12/2017 1455   MCV 92.3 10/12/2017 1455   MCH 30.3 10/12/2017 1455   MCHC 32.9 10/12/2017 1455   RDW 16.3 (H) 10/12/2017 1455   LYMPHSABS 1.5 10/12/2017 1455   MONOABS 0.5 10/12/2017 1455   EOSABS 0.0 10/12/2017 1455   BASOSABS 0.1 10/12/2017 1455   CMP Latest Ref Rng & Units 10/12/2017 10/03/2017 09/28/2017  Glucose 70 - 99 mg/dL 94 115(H) 241(H)  BUN 6 - 20 mg/dL 14 16 11   Creatinine 0.61 - 1.24 mg/dL 0.61 0.67 0.59(L)  Sodium 135 - 145 mmol/L 135 138 132(L)  Potassium 3.5 - 5.1 mmol/L 4.1 4.7 3.3(L)  Chloride 98 - 111 mmol/L 96(L) 98 97(L)  CO2 22 - 32 mmol/L 30 30 27   Calcium 8.9 - 10.3 mg/dL 9.1 9.3 8.0(L)  Total Protein 6.5 - 8.1 g/dL 6.3(L) 7.0 -  Total Bilirubin 0.3 - 1.2 mg/dL 0.5 0.6 -  Alkaline Phos 38 - 126 U/L 95 101 -  AST 15 - 41 U/L 24 19 -  ALT 0 - 44 U/L 27 26 -        ASSESSMENT & PLAN:   Gastroesophageal cancer (H[facility]) 1.  GE junction poorly differentiated adenocarcinoma (uT3uNx): - EGD on 06/21/2017 shows benign-appearing stenosis which was dilated in the distal esophagus.  Localized prominent gastric folds were found in the cardia, which was biopsied.  There was also localized mild inflammation with congestion and erythema in the antrum, biopsies were taken for H. pylori testing. - Stomach biopsy shows chronic gastritis with H. pylori positive.  Esophageal gastric junction biopsy shows poorly differentiated adenocarcinoma. -I have discussed the findings of the pathology report with the patient and his wife in detail. -PET CT scan dated 07/12/2017 showed hypermetabolic uptake in the distal esophagus, extending into GE junction.  The segment is approximately 4.2 cm [place]. - On 07/28/2017 EGD/EUS done by Dr. Edison Nasuti showed circumferential  tight, nearly obstructing stricture at GE junction,  1.3 cm thick, with the lumen of 1 to 2 mm.  UT3 NX. -Jejunostomy tube was placed on 08/03/2017.  He lost about 12 pounds since the diagnosis.  He is receiving tube feeds via pump over more than 12 hours at nighttime, at 110-140 mL/h.  He is tolerating them very well.  He has not lost any weight in the last couple of weeks. - Weekly carbo platinum and paclitaxel with radiation started on 08/30/2017 - Received cycle 4 treatment on 09/20/2017.  Was admitted to the hospital with neutropenic fever for 2 to 3 days. -He was evaluated at [facility] as a multidisciplinary consultation for second opinion.  He would like to proceed with surgery with Dr. Williemae Natter at [facility] upon completion of chemoradiation therapy.  He already has a PET scan scheduled on 11/07/2017. -Cycle 5 chemotherapy was on 10/04/2017.  He finished radiation therapy on 10/06/2017. -The epigastric pain has improved.  He is able to swallow water without regurgitation.  He had some problems with the G-tube clogging up over the weekend when he had to go to the emergency room.  He does occasionally get nauseous when he starts tube feeds. -I have discussed the results of the blood work which have returned to almost normal.  He was advised to initiate some physical activity. - He has PET scan on 11/07/2017.  He will see me back end of November to discuss results and treatment plan.  He also sees Dr. Williemae Natter at [facility] on the same day of PET scan.      Orders placed this encounter:  Orders Placed This Encounter  Procedures  . CBC with Differential/Platelet  . Comprehensive metabolic panel      Derek Jack, MD [place] [phone]

## 2017-10-12 NOTE — Patient Instructions (Signed)
[facility] at [facility] Discharge Instructions     Thank you for choosing [facility] at [facility] to provide your oncology and hematology care.  To afford each patient quality time with our provider, please arrive at least 15 minutes before your scheduled appointment time.   If you have a lab appointment with the Cancer Center please come in thru the  Main Entrance and check in at the main information desk  You need to re-schedule your appointment should you arrive 10 or more minutes late.  We strive to give you quality time with our providers, and arriving late affects you and other patients whose appointments are after yours.  Also, if you no show three or more times for appointments you may be dismissed from the clinic at the providers discretion.     Again, thank you for choosing [facility].  Our hope is that these requests will decrease the amount of time that you wait before being seen by our physicians.       _____________________________________________________________  Should you have questions after your visit to [facility], please contact our office at [phone] between the hours of 8:00 a.m. and 4:30 p.m.  Voicemails left after 4:00 p.m. will not be returned until the following business day.  For prescription refill requests, have your pharmacy contact our office and allow 72 hours.    Cancer Center Support Programs:   > Cancer Support Group  2nd Tuesday of the month 1pm-2pm, Journey Room    

## 2017-10-18 ENCOUNTER — Ambulatory Visit (HOSPITAL_COMMUNITY): Payer: BLUE CROSS/BLUE SHIELD

## 2017-10-20 ENCOUNTER — Ambulatory Visit: Payer: BLUE CROSS/BLUE SHIELD | Admitting: Cardiothoracic Surgery

## 2017-10-25 ENCOUNTER — Ambulatory Visit: Payer: BLUE CROSS/BLUE SHIELD | Admitting: Gastroenterology

## 2017-11-10 ENCOUNTER — Ambulatory Visit: Payer: BLUE CROSS/BLUE SHIELD | Admitting: Cardiothoracic Surgery

## 2017-11-18 ENCOUNTER — Ambulatory Visit (HOSPITAL_COMMUNITY): Payer: BLUE CROSS/BLUE SHIELD | Admitting: Hematology

## 2017-11-18 ENCOUNTER — Other Ambulatory Visit (HOSPITAL_COMMUNITY): Payer: BLUE CROSS/BLUE SHIELD

## 2017-11-27 ENCOUNTER — Encounter (HOSPITAL_COMMUNITY): Payer: Self-pay | Admitting: Emergency Medicine

## 2017-11-27 ENCOUNTER — Other Ambulatory Visit: Payer: Self-pay

## 2017-11-27 ENCOUNTER — Emergency Department (HOSPITAL_COMMUNITY)
Admission: EM | Admit: 2017-11-27 | Discharge: 2017-11-27 | Disposition: A | Discharge status: Home / Self Care | Payer: BLUE CROSS/BLUE SHIELD | Attending: Emergency Medicine | Admitting: Emergency Medicine

## 2017-11-27 DIAGNOSIS — E785 Hyperlipidemia, unspecified: Secondary | ICD-10-CM | POA: Insufficient documentation

## 2017-11-27 DIAGNOSIS — K9413 Enterostomy malfunction: Secondary | ICD-10-CM | POA: Diagnosis not present

## 2017-11-27 DIAGNOSIS — I10 Essential (primary) hypertension: Secondary | ICD-10-CM | POA: Diagnosis not present

## 2017-11-27 DIAGNOSIS — Z79899 Other long term (current) drug therapy: Secondary | ICD-10-CM | POA: Insufficient documentation

## 2017-11-27 DIAGNOSIS — Z85028 Personal history of other malignant neoplasm of stomach: Secondary | ICD-10-CM | POA: Insufficient documentation

## 2017-11-27 DIAGNOSIS — Z87891 Personal history of nicotine dependence: Secondary | ICD-10-CM | POA: Diagnosis not present

## 2017-11-27 DIAGNOSIS — T85528A Displacement of other gastrointestinal prosthetic devices, implants and grafts, initial encounter: Secondary | ICD-10-CM

## 2017-11-27 NOTE — ED Triage Notes (Signed)
Patient states J-tube came out this morning while showering. Per patient he put it back in but is unable to flush it. J-tube originally placed 5/12 and was replaced 2 weeks   Ago (9/28) at [facility]. Patient states unable to have j-tube with balloon because it causes obstruction.

## 2017-11-27 NOTE — Discharge Instructions (Signed)
You were evaluated in the emergency department for a J-tube that had fallen out.  Interventional radiology will contact you tomorrow morning with an appointment for procedure to place the J-tube again. Their phone number if you do not hear is [phone].  Do not use the J-tube for feeding.  Please try to keep it in place.

## 2017-11-27 NOTE — ED Provider Notes (Signed)
[facility] EMERGENCY DEPARTMENT Provider Note   CSN: [id number] Arrival date & time: 11/27/17  4008     History   Chief Complaint Chief Complaint  Patient presents with  . Other    HPI DARSHAWN BOATENG is a 62 y.o. male.  He has a history of gastroesophageal cancer and has a J-tube that was placed by Dr. Constance Haw in June 2019.  He is able to take some sustenance by mouth but also does about 4 cans of ?osmolyte by tube feeds at night.  Today while in the shower he accidentally dislodged the J-tube fully out of his body.  He tried to replace it through the stoma and flush some saline but he did not feel like it was working correctly.  He denies any abdominal pain no fevers no chills.  The history is provided by the patient.    Past Medical History:  Diagnosis Date  . Cancer ([place])    stomach  . Hyperlipidemia   . Hypertension   . Sleep apnea, obstructive   . Thoracic aortic aneurysm [facility])     Patient Active Problem List   Diagnosis Date Noted  . Essential hypertension 09/26/2017  . Hyperlipidemia 09/26/2017  . SIRS (systemic inflammatory response syndrome) ([place]) 09/26/2017  . Fever 09/26/2017  . Protein-calorie malnutrition, severe 08/10/2017  . Gastric cancer ([place]) 08/03/2017  . Gastroesophageal cancer ([place]) 07/04/2017  . Food impaction of esophagus 06/21/2017  . Gastritis and gastroduodenitis   . Esophageal dysphagia     Past Surgical History:  Procedure Laterality Date  . colonoscopy     [place] 56 polyps removed, age 40: ? POLYPS, AGE 64: ? POLYPS  . ESOPHAGEAL DILATION N/A 06/21/2017   Procedure: ESOPHAGEAL DILATION;  Surgeon: Danie Binder, MD;  Location: [facility] ENDO SUITE;  Service: Endoscopy;  Laterality: N/A;  . ESOPHAGOGASTRODUODENOSCOPY N/A 06/21/2017   Procedure: ESOPHAGOGASTRODUODENOSCOPY (EGD);  Surgeon: Danie Binder, MD;  Location: [facility] ENDO SUITE;  Service: Endoscopy;  Laterality: N/A;  . EUS N/A 07/28/2017   Procedure: UPPER ENDOSCOPIC ULTRASOUND  (EUS) RADIAL;  Surgeon: [name], MD;  Location: [facility] ENDOSCOPY;  Service: Endoscopy;  Laterality: N/A;  . EYE SURGERY Bilateral    cataract  . JEJUNOSTOMY N/A 08/03/2017   Procedure: OPEN JEJUNOSTOMY TUBE PLACEMENT;  Surgeon: Virl Cagey, MD;  Location: [facility] ORS;  Service: General;  Laterality: N/A;  . PORTACATH PLACEMENT N/A 08/03/2017   Procedure: INSERTION PORT-A-CATH;  Surgeon: Virl Cagey, MD;  Location: [facility] ORS;  Service: General;  Laterality: N/A;        Home Medications    Prior to Admission medications   Medication Sig Start Date End Date Taking? Authorizing Provider  acetaminophen (TYLENOL) 160 MG/5ML liquid Take by mouth every 4 (four) hours as needed for fever.    [provider]  cyclobenzaprine (FLEXERIL) 5 MG tablet Take 1 tablet (5 mg total) by mouth 3 (three) times daily as needed for muscle spasms. 08/16/17   Derek Jack, MD  docusate (COLACE) 50 MG/5ML liquid Place 10 mLs (100 mg total) into feeding tube 2 (two) times daily as needed for mild constipation. 09/28/17   Barton Dubois, MD  lidocaine (XYLOCAINE) 2 % solution Viscous lidocaine 2% and Maalox. 1:1 concentration. Swish and swallow 1 tablespoon four times a day. 10/04/17   Derek Jack, MD  lidocaine-prilocaine (EMLA) cream Apply to affected area once 08/02/17   Derek Jack, MD  Loperamide HCl (IMODIUM A-D) 1 MG/7.5ML LIQD Take by mouth.  [provider]  Morphine Sulfate (MORPHINE CONCENTRATE) 10 mg / 0.5 ml concentrated solution Take 0.5 mLs (10 mg total) by mouth every 4 (four) hours as needed for severe pain. 10/04/17   Lockamy, Theresia Lo, NP-C  Nutritional Supplements (FEEDING SUPPLEMENT, OSMOLITE 1.5 CAL,) LIQD Give osmolite 1.5 at 1101ml/hr for 12 hours via continuous pump through J-tube per day.  Start at rate of 138ml/hr for 12 hours and increase by 39ml daily until goal rate of 16ml reached. 08/19/17   Derek Jack, MD  ondansetron (ZOFRAN-ODT) 4  MG disintegrating tablet Take 1 tablet (4 mg total) by mouth every 6 (six) hours as needed for nausea or vomiting. 08/11/17   Virl Cagey, MD  prochlorperazine (COMPAZINE) 10 MG tablet Take 1 tablet (10 mg total) by mouth every 6 (six) hours as needed for nausea or vomiting. 08/26/17   Derek Jack, MD  sucralfate (CARAFATE) 1 GM/10ML suspension Take 10 mLs (1 g total) by mouth 4 (four) times daily -  with meals and at bedtime. 08/11/17   Virl Cagey, MD  Water For Irrigation, Sterile (FREE WATER) SOLN Place 200 mLs into feeding tube every 8 (eight) hours. 09/28/17   Barton Dubois, MD  Zolpidem Tartrate 5 MG/ACT SOLN Take 1 Act (5 mg total) by mouth at bedtime. 08/19/17   Derek Jack, MD    Family History Family History  Problem Relation Age of Onset  . Hypertension Mother   . Hypertension Father   . Diabetes Father   . Colon cancer Neg Hx   . Colon polyps Neg Hx     Social History Social History   Tobacco Use  . Smoking status: Former Smoker    Packs/day: 1.00    Types: Cigarettes  . Smokeless tobacco: Never Used  Substance Use Topics  . Alcohol use: Not Currently  . Drug use: Never     Allergies   Adhesive [tape]   Review of Systems Review of Systems  Constitutional: Negative for fever.  HENT: Negative for sore throat.   Eyes: Negative for visual disturbance.  Respiratory: Negative for shortness of breath.   Cardiovascular: Negative for chest pain.  Gastrointestinal: Negative for abdominal pain.  Genitourinary: Negative for dysuria.  Musculoskeletal: Negative for neck pain.  Skin: Negative for rash.  Neurological: Negative for headaches.     Physical Exam Updated Vital Signs BP 115/83 (BP Location: Right Arm)   Pulse 93   Temp 97.9 F (36.6 C) (Oral)   Resp 18   Ht 5\' 10"  (1.778 m)   Wt 74.8 kg   SpO2 94%   BMI 23.68 kg/m   Physical Exam  Constitutional: He appears well-developed and well-nourished.  HENT:  Head:  Normocephalic and atraumatic.  Eyes: Conjunctivae are normal.  Neck: Neck supple.  Cardiovascular: Normal rate and regular rhythm.  No murmur heard. Pulmonary/Chest: Effort normal and breath sounds normal. No respiratory distress.  Abdominal: Soft. There is no tenderness.  He has a tube in place in his left lower quadrant.  No surrounding erythema.  Musculoskeletal: He exhibits no edema or deformity.  Neurological: He is alert.  Skin: Skin is warm and dry. Capillary refill takes less than 2 seconds.  Psychiatric: He has a normal mood and affect.  Nursing note and vitals reviewed.    ED Treatments / Results  Labs (all labs ordered are listed, but only abnormal results are displayed) Labs Reviewed - No data to display  EKG None  Radiology No results found.  Procedures Procedures (  including critical care time)  Medications Ordered in ED Medications - No data to display   Initial Impression / Assessment and Plan / ED Course  I have reviewed the triage vital signs and the nursing notes.  Pertinent labs & imaging results that were available during my care of the patient were reviewed by me and considered in my medical decision making (see chart for details).  Clinical Course as of Nov 28 1831  Sun Nov 27, 2017  1002 Discussed with Dr. Arnoldo Morale from general surgery.  He recommends reaching out to interventional radiology to see if they would be available to replace this.   [MB]  1026 Discussed with Dr. Vernard Gambles from IR.  He felt like it is possible the tube is in the right spot and we could consider trying some water-soluble contrast and a KUB to see if is intraluminal.  If it is not or we do not choose to check the to give him a call back and he will place him on the list to have the procedure done likely tomorrow.   [MB]  47 Discussed with Dr. Vernard Gambles who took the patient's name and other identifiers and the plan is for interventional radiology to contact the patient tomorrow  morning for discussion of the timing of the procedure.   [MB]    Clinical Course User Index [MB] Hayden Rasmussen, MD     Final Clinical Impressions(s) / ED Diagnoses   Final diagnoses:  Jejunostomy tube fell out    ED Discharge Orders    None       Hayden Rasmussen, MD 11/27/17 [phone]

## 2017-11-28 ENCOUNTER — Ambulatory Visit (HOSPITAL_COMMUNITY)
Admission: RE | Admit: 2017-11-28 | Discharge: 2017-11-28 | Disposition: A | Discharge status: Home / Self Care | Payer: BLUE CROSS/BLUE SHIELD | Source: Ambulatory Visit | Attending: Interventional Radiology | Admitting: Interventional Radiology

## 2017-11-28 ENCOUNTER — Inpatient Hospital Stay (HOSPITAL_COMMUNITY): Admission: RE | Admit: 2017-11-28 | Payer: BLUE CROSS/BLUE SHIELD | Source: Ambulatory Visit

## 2017-11-28 ENCOUNTER — Other Ambulatory Visit (HOSPITAL_COMMUNITY): Payer: Self-pay | Admitting: Interventional Radiology

## 2017-11-28 ENCOUNTER — Encounter (HOSPITAL_COMMUNITY): Payer: Self-pay | Admitting: Interventional Radiology

## 2017-11-28 DIAGNOSIS — Z431 Encounter for attention to gastrostomy: Secondary | ICD-10-CM | POA: Diagnosis present

## 2017-11-28 DIAGNOSIS — C159 Malignant neoplasm of esophagus, unspecified: Secondary | ICD-10-CM

## 2017-11-28 HISTORY — PX: IR REPLC DUODEN/JEJUNO TUBE PERCUT W/FLUORO: IMG2334

## 2017-11-28 MED ORDER — IOPAMIDOL (ISOVUE-300) INJECTION 61%
50.0000 mL | Freq: Once | INTRAVENOUS | Status: AC | PRN
  Administered 2017-11-28: 20 mL

## 2017-11-28 MED ORDER — LIDOCAINE-EPINEPHRINE (PF) 2 %-1:200000 IJ SOLN
INTRAMUSCULAR | Status: AC
  Filled 2017-11-28: qty 20

## 2017-11-28 MED ORDER — IOPAMIDOL (ISOVUE-300) INJECTION 61%
INTRAVENOUS | Status: AC
  Administered 2017-11-28: 20 mL
  Filled 2017-11-28: qty 50

## 2017-11-28 NOTE — Procedures (Signed)
Pre procedural Dx: Dislodged J-tube Post procedural Dx: Same  Successful fluoroscopic guided replacement of new balloon retention J-tube. The feeding tube is ready for immediate use.  EBL: None  Complications: None immediate.  Ronny Bacon, MD Pager #: [phone]

## 2017-12-07 ENCOUNTER — Encounter (HOSPITAL_COMMUNITY): Payer: Self-pay | Admitting: Internal Medicine

## 2018-07-08 IMAGING — CR DG CHEST 1V PORT
1 series · 1 of 1 positions shown · non-contrast
Comparison: PET-CT 07/12/2017

CLINICAL DATA: 61-year-old male status post surgical right
Port-A-Cath and jejunostomy tube placement today. GE junction
adenocarcinoma.

EXAM:
PORTABLE CHEST 1 VIEW

[portable]
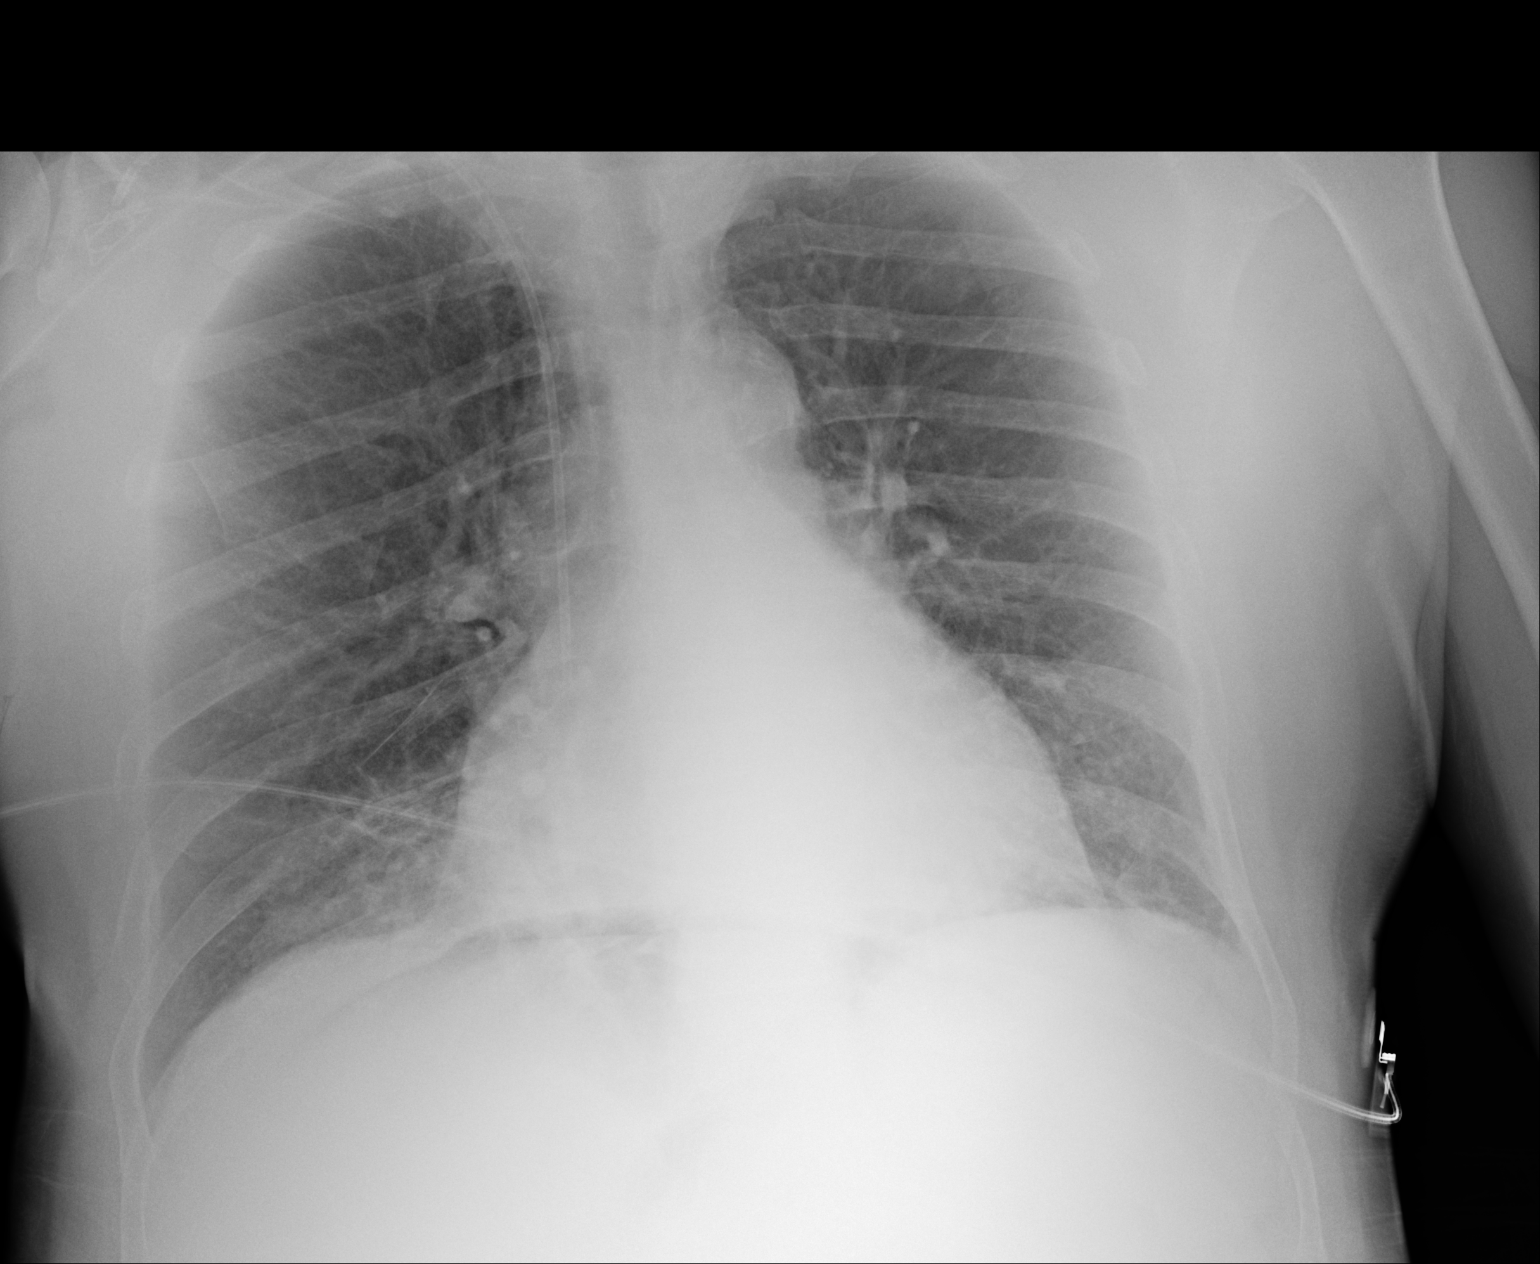

[1 of 1 positions shown; findings below may reference images not displayed]

FINDINGS: Portable AP upright view at 3557 hours. Right subclavian approach
chest porta cath in place. Catheter tip projects at the cavoatrial
junction level. Mediastinal contours remain within normal limits. No
pneumothorax, pulmonary edema, pleural effusion or consolidation.

Small volume pneumoperitoneum suspected in the upper abdomen likely
related to jejunostomy tube placement. Paucity of upper abdominal
bowel gas.
IMPRESSION: 1. Right subclavian approach Port-A-Cath placement with no adverse
features. Catheter tip at the cavoatrial junction level.
2. Small volume pneumoperitoneum, most likely related to jejunostomy
tube placement today.

## 2018-07-10 IMAGING — DX DG ABDOMEN 1V
2 series · 2 of 2 positions shown · non-contrast
Comparison: None

CLINICAL DATA: RIGHT-side lower abdominal pain since this morning,
vomiting, jejunostomy tube placed 2 days ago

EXAM:
ABDOMEN - 1 VIEW

[abdomen kub (1 of 2)]
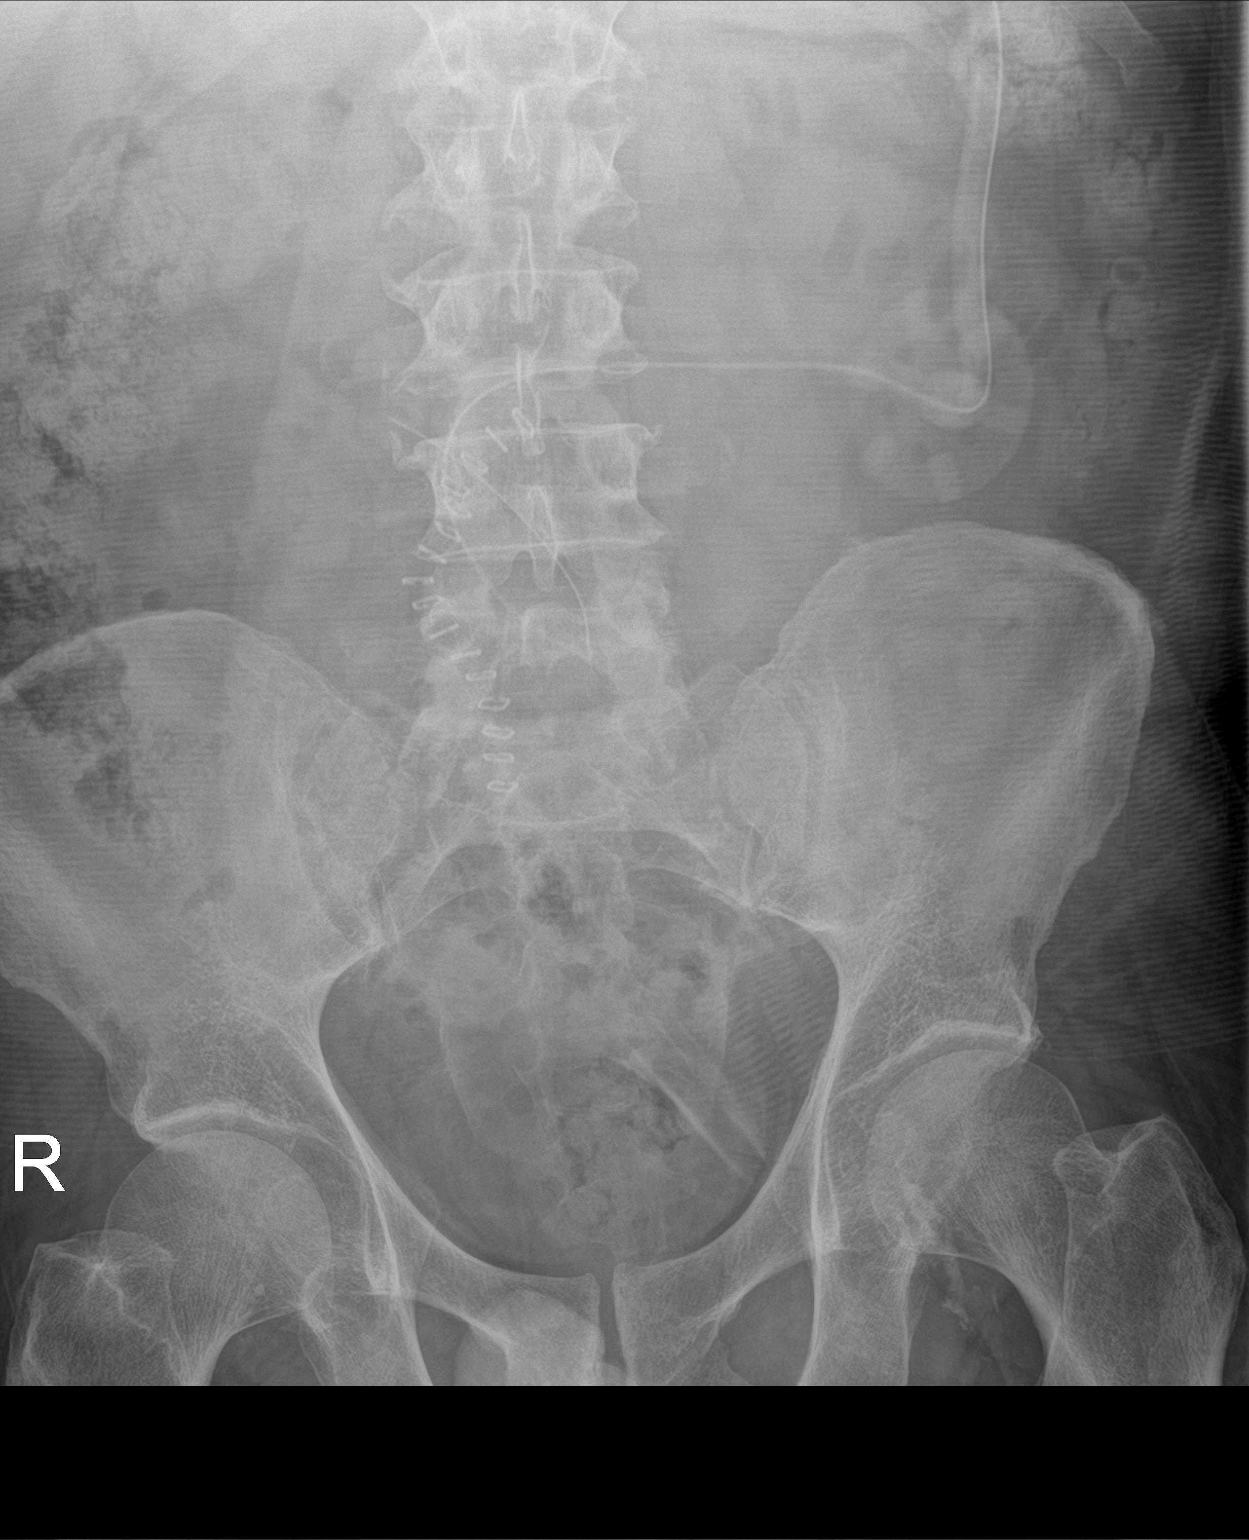

[abdomen kub (2 of 2)]
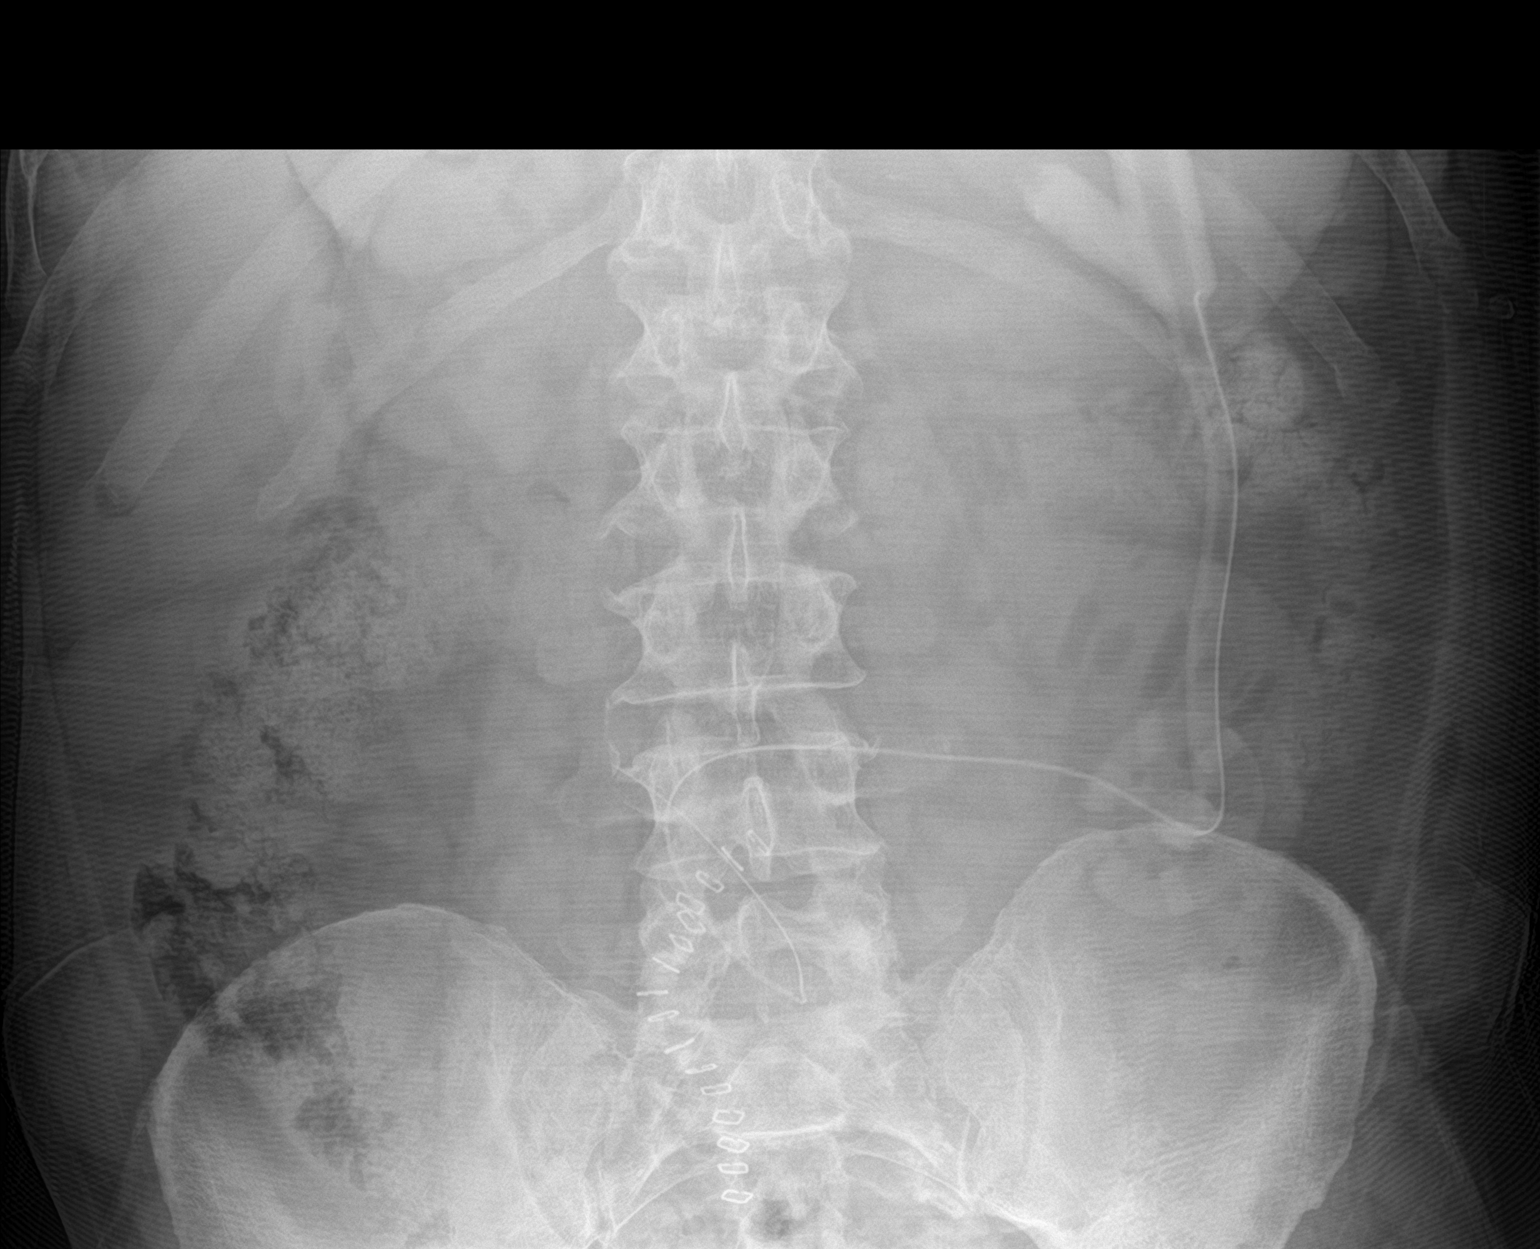

[2 of 2 positions shown; findings below may reference images not displayed]

FINDINGS: Nonobstructive bowel gas pattern.

A few mildly dilated loops of small bowel are seen in the LEFT mid
abdomen, predominantly fluid-filled with minimal air, question
ileus.

Linear gas collection at the porta hepatis of the liver likely
represents a small amount of air, which may be normal 2 days post
abdominal surgery.

Scattered stool in colon.

Bones unremarkable.

Skin clips at mid abdomen.
IMPRESSION: Few mildly prominent predominantly fluid-filled loops of small bowel
in the LEFT mid abdomen question ileus.

Potential small amount of free air in the RIGHT upper quadrant, not
unexpected 2 days post open abdominal surgery.

## 2018-08-31 IMAGING — CR DG CHEST 1V PORT
1 series · 1 of 1 positions shown · non-contrast
Comparison: 08/06/2017

CLINICAL DATA: Fever and nausea. Cough. Ongoing chemotherapy for
gastric cancer.

EXAM:
PORTABLE CHEST 1 VIEW

[portable]
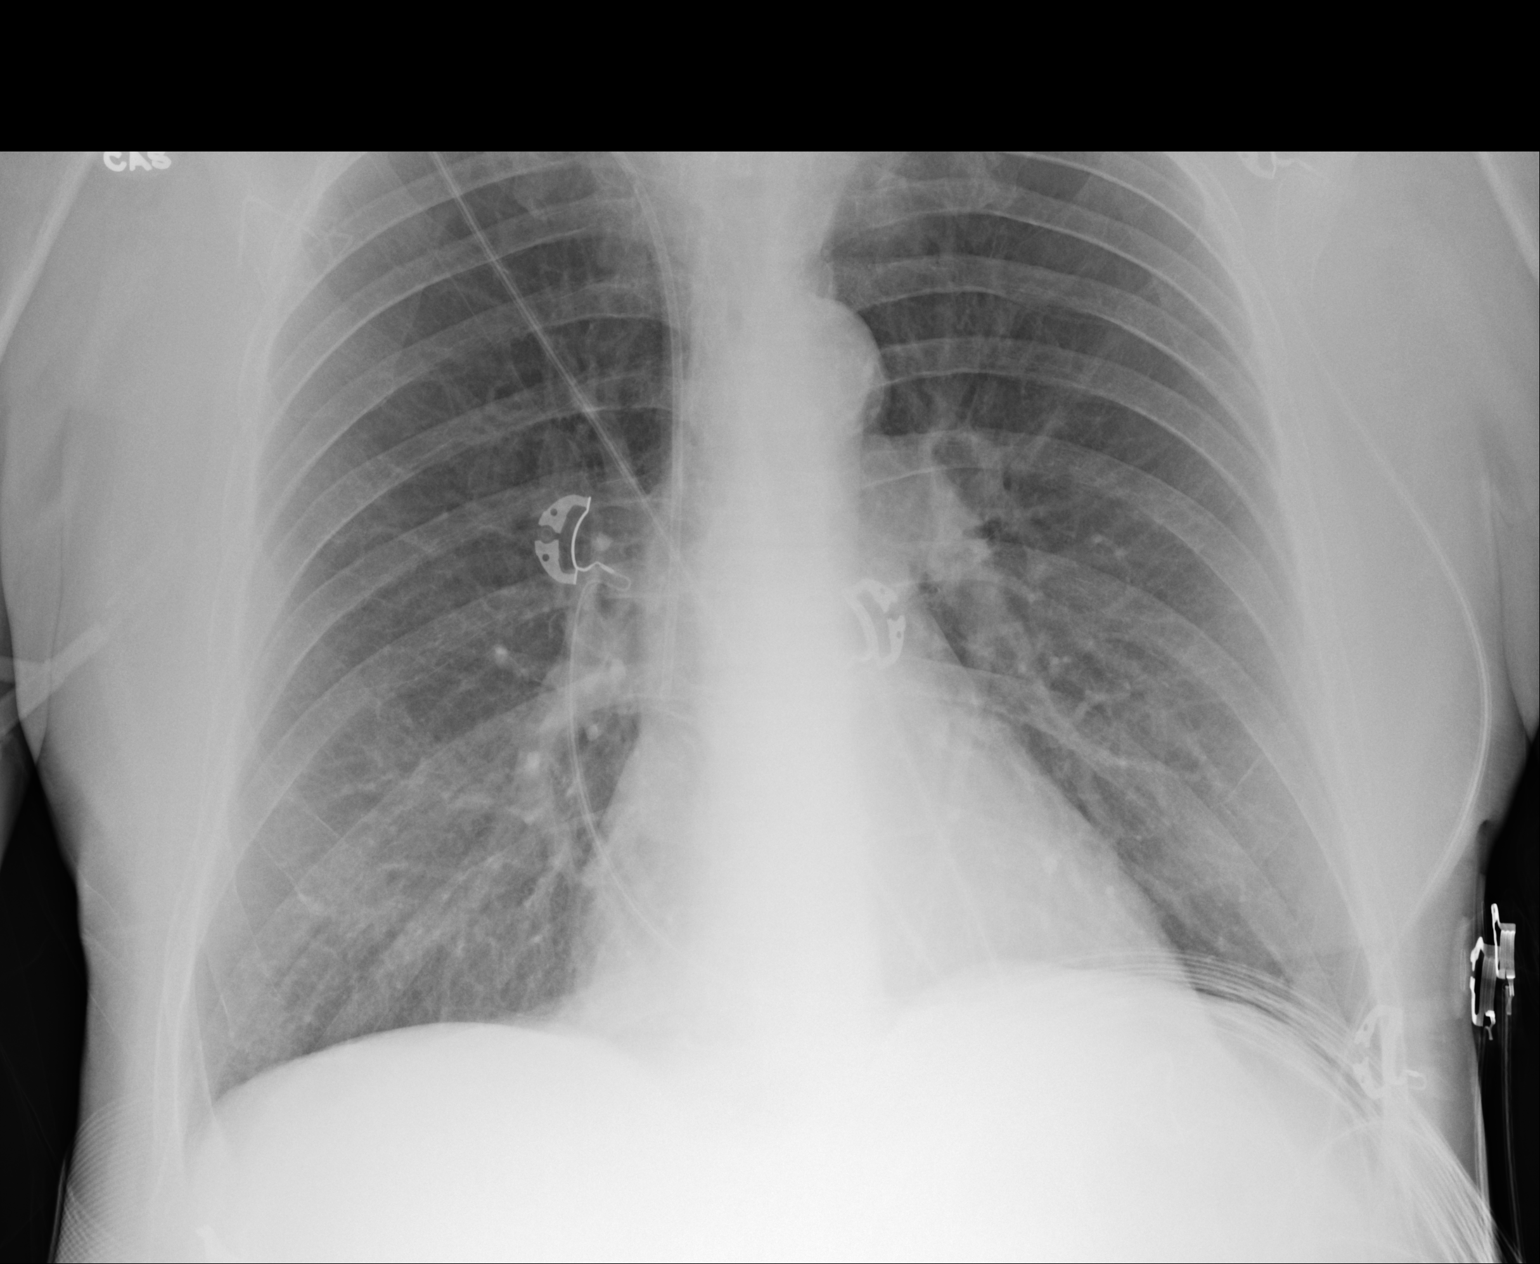

[1 of 1 positions shown; findings below may reference images not displayed]

FINDINGS: A right subclavian Port-A-Cath terminates over the lower SVC. The
cardiac silhouette is borderline enlarged. Aortic atherosclerosis is
noted. The patient has taken a greater inspiration than on the prior
study with improved aeration of the lung bases. No airspace
consolidation, edema, sizable pleural effusion, or pneumothorax is
identified. No acute osseous abnormality is seen.
IMPRESSION: Improved basilar aeration without evidence of active disease.

## 2018-09-23 DEATH — deceased

## 2018-11-02 IMAGING — XA IR REPLC DUODEN/JEJUNO TUBE PERCUT W/FLUORO
1 series · 3 of 3 positions shown · non-contrast
Comparison: CT abdomen pelvis - 08/09/2017;

INDICATION: History of chronic surgically placed jejunostomy tube, now with
inadvertent removal post bedside replacement.

Note, patient with history of previous small-bowel obstruction
secondary to the jejunostomy balloon demonstrated on CT scan
performed 08/09/2017.
Please perform fluoroscopic guided injection and exchange as
indicated.
EXAM:
FLUOROSCOPIC GUIDED REPLACEMENT OF GASTROJEJUNOSTOMY TUBE

[Series 300: ir replc duoden/jejuno tube percut w/flu · 3 of 3 slices shown]
[im 1/3]
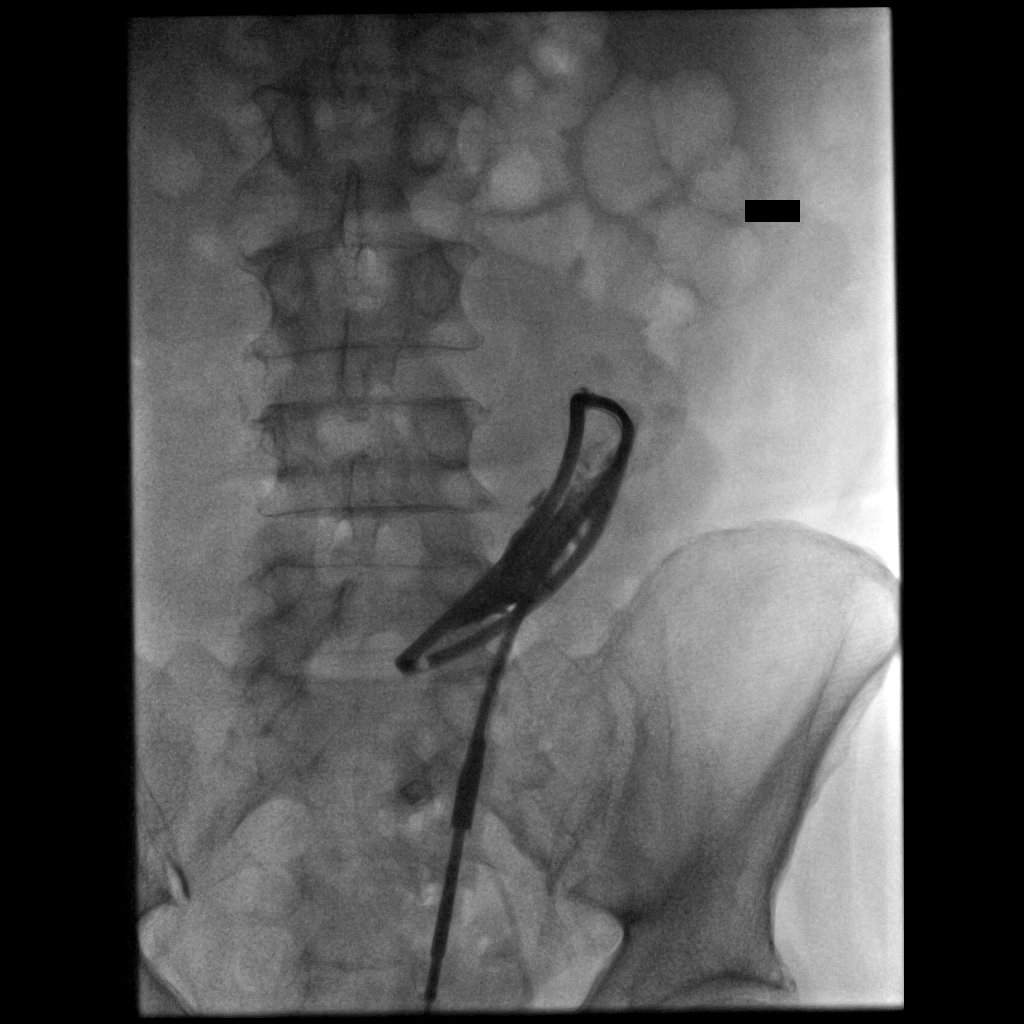
[im 2/3]
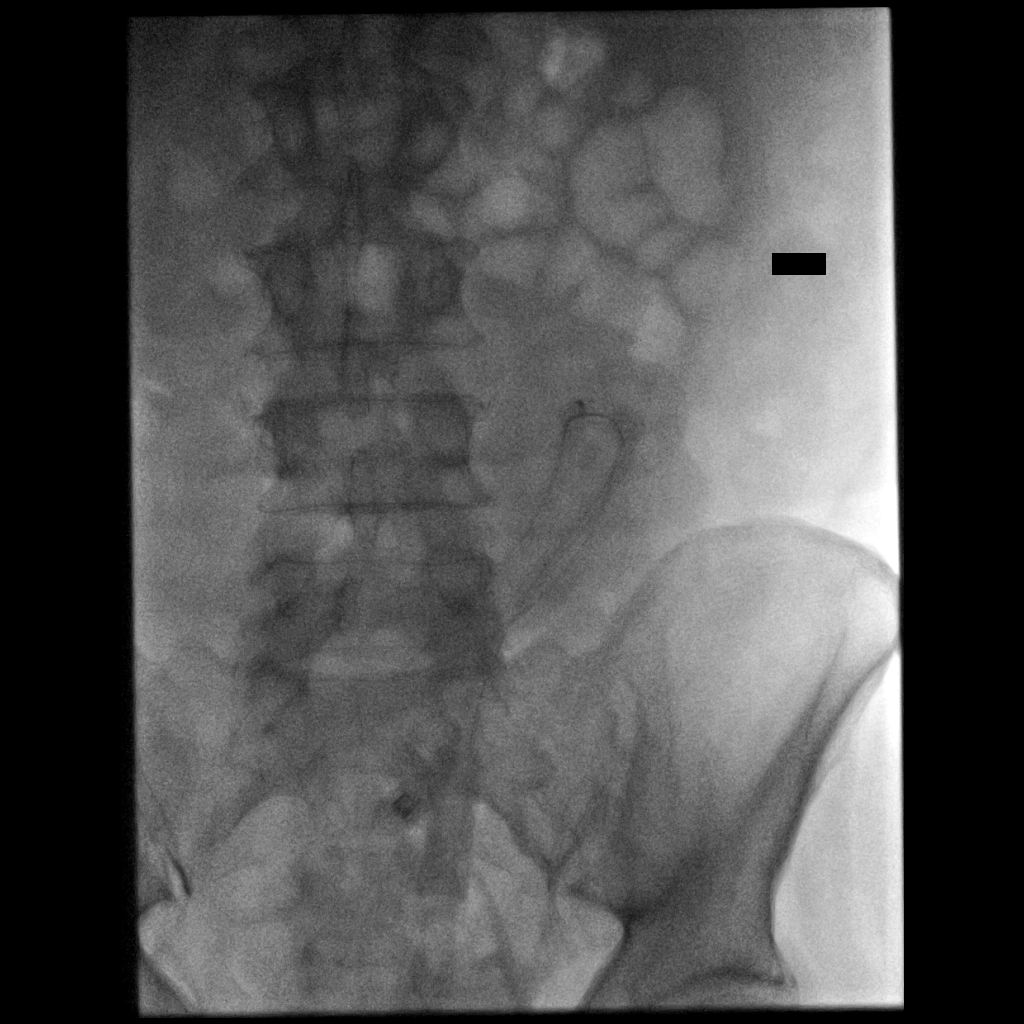
[im 3/3]
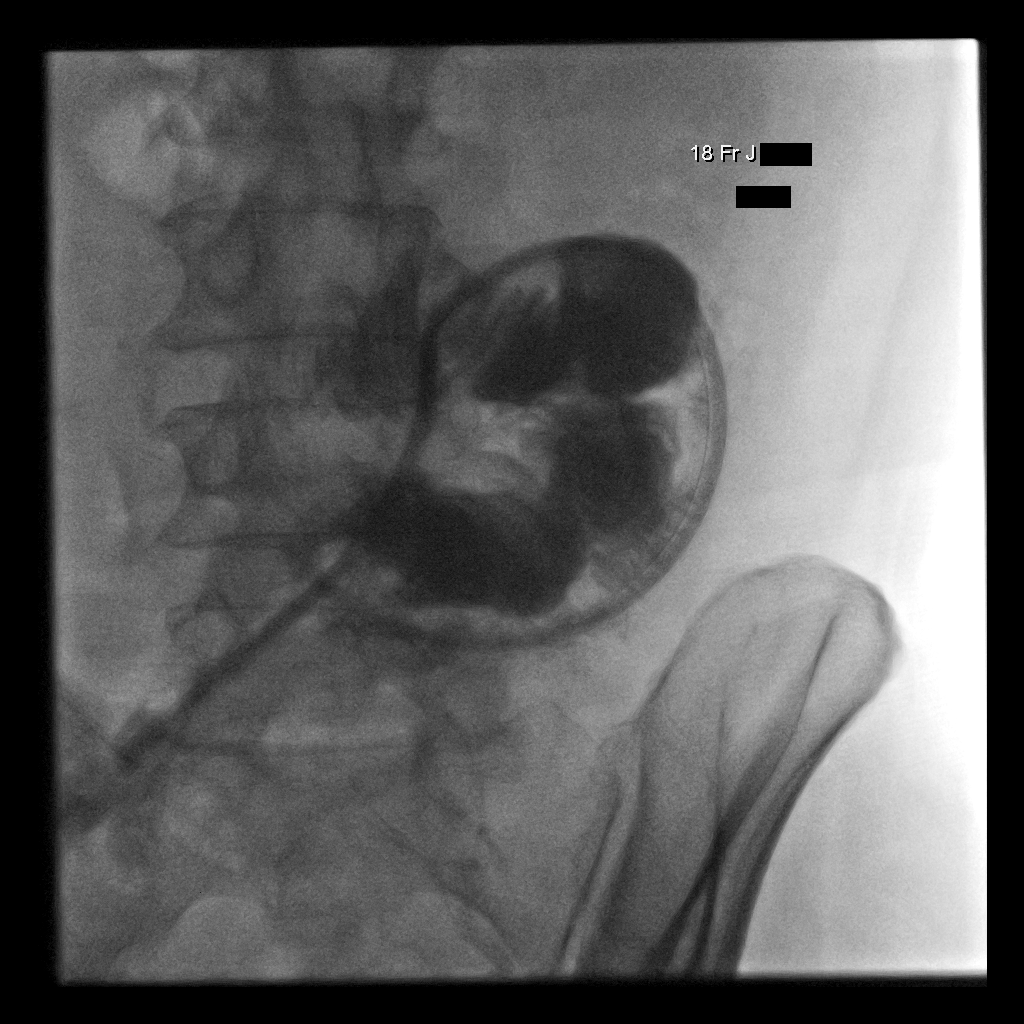

[3 of 3 positions shown; findings below may reference images not displayed]

jejunostomy catheter
injection - 09/07/2017

MEDICATIONS:
None

CONTRAST:  20 cc [id number], administered into the jejunal lumen

FLUOROSCOPY TIME:  3 minutes, 42 seconds (7 mGy)

COMPLICATIONS:
None immediate.

PROCEDURE:
The site of prior jejunostomy tube was prepped and draped in the
usual sterile fashion, and a sterile drape was applied covering the
operative field. Maximum barrier sterile technique with sterile
gowns and gloves were used for the procedure. A timeout was
performed prior to the initiation of the procedure.

Existing jejunostomy catheter was injected with a small amount
contrast

Next, the existing jejunostomy catheter was cannulated with a stiff
Glidewire and exchanged for a Kumpe catheter. The Kumpe catheter was
utilized manipulate the stiff Glidewire further into the small
bowel. Contrast injection confirmed appropriate positioning with
delayed images obtained to ensure appropriate directional flow,
however this was difficult secondary to patient's overall decreased
peristalsis.

Next, under intermittent fluoroscopic guidance, the Kumpe catheter
was exchanged for a new 18 French balloon retention jejunostomy
catheter. Contrast injection confirmed appropriate positioning.

Per patient request, the jejunostomy catheter was inflated with a
very small amount (approximately 3 cc) of saline and dilute
contrast.

Additionally, per patient request, two interrupted sutures were
utilized to for the secure the entrance site of the jejunostomy
catheter after the overlying soft tissues were anesthetized with 1%
lidocaine.

A dressing was placed. The patient tolerated the procedure well
without immediate postprocedural complication.
IMPRESSION: Successful fluoroscopic guided placement of a new 18-French
jejunostomy tube. The new jejunostomy tube is ready for immediate
use.
